# Patient Record
Sex: Male | Born: 1950
Health system: Southern US, Community
[De-identification: ages and names within clinical notes are randomized; demographics above are authoritative.]

## PROBLEM LIST (undated history)

## (undated) DIAGNOSIS — R9431 Abnormal electrocardiogram [ECG] [EKG]: Secondary | ICD-10-CM

## (undated) DIAGNOSIS — J301 Allergic rhinitis due to pollen: Secondary | ICD-10-CM

## (undated) DIAGNOSIS — I10 Essential (primary) hypertension: Secondary | ICD-10-CM

## (undated) DIAGNOSIS — E7800 Pure hypercholesterolemia, unspecified: Secondary | ICD-10-CM

## (undated) HISTORY — DX: Allergic rhinitis due to pollen: J30.1

## (undated) HISTORY — DX: Essential (primary) hypertension: I10

## (undated) HISTORY — DX: Abnormal electrocardiogram (ECG) (EKG): R94.31

## (undated) HISTORY — DX: Pure hypercholesterolemia, unspecified: E78.00

---

## 1998-06-09 ENCOUNTER — Ambulatory Visit (HOSPITAL_COMMUNITY): Admission: RE | Admit: 1998-06-09 | Discharge: 1998-06-09 | Payer: Self-pay | Admitting: Internal Medicine

## 2003-10-23 ENCOUNTER — Encounter: Admission: RE | Admit: 2003-10-23 | Discharge: 2003-10-23 | Payer: Self-pay | Admitting: Internal Medicine

## 2005-02-04 ENCOUNTER — Encounter: Admission: RE | Admit: 2005-02-04 | Discharge: 2005-02-04 | Payer: Self-pay | Admitting: Cardiovascular Disease

## 2005-02-09 ENCOUNTER — Ambulatory Visit (HOSPITAL_COMMUNITY): Admission: RE | Admit: 2005-02-09 | Discharge: 2005-02-09 | Payer: Self-pay | Admitting: Cardiovascular Disease

## 2005-02-18 ENCOUNTER — Ambulatory Visit (HOSPITAL_COMMUNITY): Admission: RE | Admit: 2005-02-18 | Discharge: 2005-02-18 | Payer: Self-pay | Admitting: Cardiovascular Disease

## 2005-08-10 ENCOUNTER — Encounter: Admission: RE | Admit: 2005-08-10 | Discharge: 2005-08-10 | Payer: Self-pay | Admitting: Internal Medicine

## 2005-09-06 ENCOUNTER — Ambulatory Visit: Payer: Self-pay | Admitting: Gastroenterology

## 2005-10-08 ENCOUNTER — Ambulatory Visit: Payer: Self-pay | Admitting: Gastroenterology

## 2006-10-10 ENCOUNTER — Encounter: Admission: RE | Admit: 2006-10-10 | Discharge: 2006-10-10 | Payer: Self-pay | Admitting: Internal Medicine

## 2007-05-02 ENCOUNTER — Emergency Department (HOSPITAL_COMMUNITY): Admission: EM | Admit: 2007-05-02 | Discharge: 2007-05-02 | Payer: Self-pay | Admitting: Emergency Medicine

## 2009-01-14 ENCOUNTER — Encounter: Admission: RE | Admit: 2009-01-14 | Discharge: 2009-01-14 | Payer: Self-pay | Admitting: Internal Medicine

## 2009-03-10 ENCOUNTER — Encounter: Admission: RE | Admit: 2009-03-10 | Discharge: 2009-03-10 | Payer: Self-pay | Admitting: Internal Medicine

## 2010-12-11 ENCOUNTER — Encounter: Payer: Self-pay | Admitting: Internal Medicine

## 2011-01-15 NOTE — Cardiovascular Report (Signed)
Shane Lam, Shane Lam              ACCOUNT NO.:  [id number]   MEDICAL RECORD NO.:  [id number]          PATIENT TYPE:  OIB   LOCATION:  2899                         FACILITY:  [facility]   PHYSICIAN:  Ricki Rodriguez, M.D.  DATE OF BIRTH:  1951/01/10   DATE OF PROCEDURE:  02/18/2005  DATE OF DISCHARGE:                              CARDIAC CATHETERIZATION   PROCEDURE:  Left heart catheterization, selective coronary angiography, left  ventricular function study.   APPROACH:  Right femoral artery using 5 French sheath and catheters.   COMPLICATIONS:  None but 0.5% of Atropine IV was given for heart rate into  the 40's.   INDICATIONS:  This 60 year old black male had atypical chest pain with  abnormal nuclear stress test.   HEMODYNAMIC DATA:  The left ventricular pressure was 125/84 and aortic  pressure was 123/75.   CORONARY ANATOMY:  The left middle cerebral artery was short and  unremarkable.  Left anterior descending coronary artery was essentially  unremarkable.  It had a large diagonal vessel which was also unremarkable.  The left circumflex coronary artery was codominant and had a large obtuse  marginal branch and was essentially unremarkable.  The right coronary  artery:  The right coronary artery was codominant and perhaps had a minimal  luminal irregularity in its proximal portion, otherwise was unremarkable.   IMPRESSION:  1.  Near normal coronaries.  2.  Normal left ventricular systolic function.   RECOMMENDATIONS:  This patient may continue medical therapy for now.       ASK/MEDQ  D:  02/18/2005  T:  02/18/2005  Job:  [id number]

## 2011-06-11 LAB — I-STAT 8, (EC8 V) (CONVERTED LAB)
BUN: 19
Bicarbonate: 28.7 — ABNORMAL HIGH
Glucose, Bld: 93
pCO2, Ven: 47.7
pH, Ven: 7.388 — ABNORMAL HIGH

## 2011-06-11 LAB — POCT CARDIAC MARKERS
CKMB, poc: <1 — ABNORMAL LOW
Myoglobin, poc: 67.3
Myoglobin, poc: 72.3
Troponin i, poc: <0.05

## 2011-06-11 LAB — POCT I-STAT CREATININE
Creatinine, Ser: 1.4
Operator id: 279831

## 2012-10-10 ENCOUNTER — Encounter: Payer: Self-pay | Admitting: Hematology

## 2014-08-19 DIAGNOSIS — I10 Essential (primary) hypertension: Secondary | ICD-10-CM | POA: Diagnosis not present

## 2014-08-19 DIAGNOSIS — Z23 Encounter for immunization: Secondary | ICD-10-CM | POA: Diagnosis not present

## 2014-08-19 DIAGNOSIS — N400 Enlarged prostate without lower urinary tract symptoms: Secondary | ICD-10-CM | POA: Diagnosis not present

## 2014-08-19 DIAGNOSIS — N529 Male erectile dysfunction, unspecified: Secondary | ICD-10-CM | POA: Diagnosis not present

## 2014-08-19 DIAGNOSIS — E785 Hyperlipidemia, unspecified: Secondary | ICD-10-CM | POA: Diagnosis not present

## 2014-08-20 DIAGNOSIS — I10 Essential (primary) hypertension: Secondary | ICD-10-CM | POA: Diagnosis not present

## 2014-08-20 DIAGNOSIS — E780 Pure hypercholesterolemia: Secondary | ICD-10-CM | POA: Diagnosis not present

## 2014-08-20 DIAGNOSIS — E039 Hypothyroidism, unspecified: Secondary | ICD-10-CM | POA: Diagnosis not present

## 2014-08-20 DIAGNOSIS — D649 Anemia, unspecified: Secondary | ICD-10-CM | POA: Diagnosis not present

## 2014-08-20 DIAGNOSIS — E8881 Metabolic syndrome: Secondary | ICD-10-CM | POA: Diagnosis not present

## 2014-08-20 DIAGNOSIS — N400 Enlarged prostate without lower urinary tract symptoms: Secondary | ICD-10-CM | POA: Diagnosis not present

## 2014-09-04 DIAGNOSIS — K5900 Constipation, unspecified: Secondary | ICD-10-CM | POA: Diagnosis not present

## 2014-09-04 DIAGNOSIS — K600 Acute anal fissure: Secondary | ICD-10-CM | POA: Diagnosis not present

## 2014-09-04 DIAGNOSIS — K6289 Other specified diseases of anus and rectum: Secondary | ICD-10-CM | POA: Diagnosis not present

## 2014-10-01 DIAGNOSIS — K602 Anal fissure, unspecified: Secondary | ICD-10-CM | POA: Diagnosis not present

## 2014-10-01 DIAGNOSIS — Z86010 Personal history of colonic polyps: Secondary | ICD-10-CM | POA: Diagnosis not present

## 2014-10-01 DIAGNOSIS — K5900 Constipation, unspecified: Secondary | ICD-10-CM | POA: Diagnosis not present

## 2014-12-26 DIAGNOSIS — Z23 Encounter for immunization: Secondary | ICD-10-CM | POA: Diagnosis not present

## 2015-01-20 DIAGNOSIS — S43431A Superior glenoid labrum lesion of right shoulder, initial encounter: Secondary | ICD-10-CM | POA: Diagnosis not present

## 2015-01-20 DIAGNOSIS — S83242A Other tear of medial meniscus, current injury, left knee, initial encounter: Secondary | ICD-10-CM | POA: Diagnosis not present

## 2015-05-24 DIAGNOSIS — Z008 Encounter for other general examination: Secondary | ICD-10-CM | POA: Diagnosis not present

## 2015-06-13 DIAGNOSIS — Z23 Encounter for immunization: Secondary | ICD-10-CM | POA: Diagnosis not present

## 2015-08-06 DIAGNOSIS — N529 Male erectile dysfunction, unspecified: Secondary | ICD-10-CM | POA: Diagnosis not present

## 2015-08-06 DIAGNOSIS — Z0000 Encounter for general adult medical examination without abnormal findings: Secondary | ICD-10-CM | POA: Diagnosis not present

## 2015-08-06 DIAGNOSIS — Z800 Family history of malignant neoplasm of digestive organs: Secondary | ICD-10-CM | POA: Diagnosis not present

## 2015-08-06 DIAGNOSIS — Z125 Encounter for screening for malignant neoplasm of prostate: Secondary | ICD-10-CM | POA: Diagnosis not present

## 2015-08-06 DIAGNOSIS — Z23 Encounter for immunization: Secondary | ICD-10-CM | POA: Diagnosis not present

## 2015-08-06 DIAGNOSIS — N401 Enlarged prostate with lower urinary tract symptoms: Secondary | ICD-10-CM | POA: Diagnosis not present

## 2015-08-06 DIAGNOSIS — I10 Essential (primary) hypertension: Secondary | ICD-10-CM | POA: Diagnosis not present

## 2015-09-02 DIAGNOSIS — E876 Hypokalemia: Secondary | ICD-10-CM | POA: Diagnosis not present

## 2015-09-04 DIAGNOSIS — I10 Essential (primary) hypertension: Secondary | ICD-10-CM | POA: Diagnosis not present

## 2015-09-12 DIAGNOSIS — I10 Essential (primary) hypertension: Secondary | ICD-10-CM | POA: Diagnosis not present

## 2015-09-18 DIAGNOSIS — I10 Essential (primary) hypertension: Secondary | ICD-10-CM | POA: Diagnosis not present

## 2015-09-30 DIAGNOSIS — I10 Essential (primary) hypertension: Secondary | ICD-10-CM | POA: Diagnosis not present

## 2015-10-02 DIAGNOSIS — I10 Essential (primary) hypertension: Secondary | ICD-10-CM | POA: Diagnosis not present

## 2015-10-06 DIAGNOSIS — I10 Essential (primary) hypertension: Secondary | ICD-10-CM | POA: Diagnosis not present

## 2015-10-14 ENCOUNTER — Encounter: Payer: Self-pay | Admitting: Gastroenterology

## 2015-10-16 DIAGNOSIS — I10 Essential (primary) hypertension: Secondary | ICD-10-CM | POA: Diagnosis not present

## 2015-10-23 DIAGNOSIS — I10 Essential (primary) hypertension: Secondary | ICD-10-CM | POA: Diagnosis not present

## 2015-11-14 DIAGNOSIS — I10 Essential (primary) hypertension: Secondary | ICD-10-CM | POA: Diagnosis not present

## 2015-11-20 DIAGNOSIS — I10 Essential (primary) hypertension: Secondary | ICD-10-CM | POA: Diagnosis not present

## 2016-04-28 DIAGNOSIS — Z23 Encounter for immunization: Secondary | ICD-10-CM | POA: Diagnosis not present

## 2016-06-09 DIAGNOSIS — M79652 Pain in left thigh: Secondary | ICD-10-CM | POA: Diagnosis not present

## 2016-06-09 DIAGNOSIS — M7062 Trochanteric bursitis, left hip: Secondary | ICD-10-CM | POA: Diagnosis not present

## 2016-06-09 DIAGNOSIS — M25552 Pain in left hip: Secondary | ICD-10-CM | POA: Diagnosis not present

## 2016-08-09 DIAGNOSIS — I10 Essential (primary) hypertension: Secondary | ICD-10-CM | POA: Diagnosis not present

## 2016-08-09 DIAGNOSIS — Z0000 Encounter for general adult medical examination without abnormal findings: Secondary | ICD-10-CM | POA: Diagnosis not present

## 2016-08-09 DIAGNOSIS — Z125 Encounter for screening for malignant neoplasm of prostate: Secondary | ICD-10-CM | POA: Diagnosis not present

## 2016-08-13 DIAGNOSIS — R7989 Other specified abnormal findings of blood chemistry: Secondary | ICD-10-CM | POA: Diagnosis not present

## 2016-08-13 DIAGNOSIS — N401 Enlarged prostate with lower urinary tract symptoms: Secondary | ICD-10-CM | POA: Diagnosis not present

## 2016-08-13 DIAGNOSIS — N529 Male erectile dysfunction, unspecified: Secondary | ICD-10-CM | POA: Diagnosis not present

## 2016-08-13 DIAGNOSIS — I10 Essential (primary) hypertension: Secondary | ICD-10-CM | POA: Diagnosis not present

## 2016-08-13 DIAGNOSIS — Z800 Family history of malignant neoplasm of digestive organs: Secondary | ICD-10-CM | POA: Diagnosis not present

## 2016-08-25 DIAGNOSIS — Z1212 Encounter for screening for malignant neoplasm of rectum: Secondary | ICD-10-CM | POA: Diagnosis not present

## 2016-08-25 DIAGNOSIS — Z1211 Encounter for screening for malignant neoplasm of colon: Secondary | ICD-10-CM | POA: Diagnosis not present

## 2017-08-16 DIAGNOSIS — Z23 Encounter for immunization: Secondary | ICD-10-CM | POA: Diagnosis not present

## 2017-08-16 DIAGNOSIS — I10 Essential (primary) hypertension: Secondary | ICD-10-CM | POA: Diagnosis not present

## 2017-08-16 DIAGNOSIS — E876 Hypokalemia: Secondary | ICD-10-CM | POA: Diagnosis not present

## 2017-08-16 DIAGNOSIS — Z125 Encounter for screening for malignant neoplasm of prostate: Secondary | ICD-10-CM | POA: Diagnosis not present

## 2017-08-19 DIAGNOSIS — N529 Male erectile dysfunction, unspecified: Secondary | ICD-10-CM | POA: Diagnosis not present

## 2017-08-19 DIAGNOSIS — E876 Hypokalemia: Secondary | ICD-10-CM | POA: Diagnosis not present

## 2017-08-19 DIAGNOSIS — N281 Cyst of kidney, acquired: Secondary | ICD-10-CM | POA: Diagnosis not present

## 2017-08-19 DIAGNOSIS — Z800 Family history of malignant neoplasm of digestive organs: Secondary | ICD-10-CM | POA: Diagnosis not present

## 2017-08-19 DIAGNOSIS — I10 Essential (primary) hypertension: Secondary | ICD-10-CM | POA: Diagnosis not present

## 2017-08-19 DIAGNOSIS — N401 Enlarged prostate with lower urinary tract symptoms: Secondary | ICD-10-CM | POA: Diagnosis not present

## 2017-09-16 DIAGNOSIS — R790 Abnormal level of blood mineral: Secondary | ICD-10-CM | POA: Diagnosis not present

## 2017-09-16 DIAGNOSIS — I10 Essential (primary) hypertension: Secondary | ICD-10-CM | POA: Diagnosis not present

## 2017-09-19 DIAGNOSIS — E876 Hypokalemia: Secondary | ICD-10-CM | POA: Diagnosis not present

## 2017-09-19 DIAGNOSIS — I10 Essential (primary) hypertension: Secondary | ICD-10-CM | POA: Diagnosis not present

## 2017-09-19 DIAGNOSIS — Z6830 Body mass index (BMI) 30.0-30.9, adult: Secondary | ICD-10-CM | POA: Diagnosis not present

## 2017-09-22 DIAGNOSIS — E876 Hypokalemia: Secondary | ICD-10-CM | POA: Diagnosis not present

## 2017-11-17 DIAGNOSIS — M19071 Primary osteoarthritis, right ankle and foot: Secondary | ICD-10-CM | POA: Diagnosis not present

## 2017-11-17 DIAGNOSIS — I10 Essential (primary) hypertension: Secondary | ICD-10-CM | POA: Diagnosis not present

## 2017-11-17 DIAGNOSIS — M25571 Pain in right ankle and joints of right foot: Secondary | ICD-10-CM | POA: Diagnosis not present

## 2017-11-21 DIAGNOSIS — M25571 Pain in right ankle and joints of right foot: Secondary | ICD-10-CM | POA: Diagnosis not present

## 2017-12-19 DIAGNOSIS — I10 Essential (primary) hypertension: Secondary | ICD-10-CM | POA: Diagnosis not present

## 2017-12-19 DIAGNOSIS — Z6827 Body mass index (BMI) 27.0-27.9, adult: Secondary | ICD-10-CM | POA: Diagnosis not present

## 2018-01-04 DIAGNOSIS — E876 Hypokalemia: Secondary | ICD-10-CM | POA: Diagnosis not present

## 2018-01-13 DIAGNOSIS — E876 Hypokalemia: Secondary | ICD-10-CM | POA: Diagnosis not present

## 2018-03-29 DIAGNOSIS — M7711 Lateral epicondylitis, right elbow: Secondary | ICD-10-CM | POA: Diagnosis not present

## 2018-03-30 DIAGNOSIS — M7712 Lateral epicondylitis, left elbow: Secondary | ICD-10-CM | POA: Diagnosis not present

## 2018-05-15 DIAGNOSIS — Z23 Encounter for immunization: Secondary | ICD-10-CM | POA: Diagnosis not present

## 2018-06-29 DIAGNOSIS — Z1211 Encounter for screening for malignant neoplasm of colon: Secondary | ICD-10-CM | POA: Diagnosis not present

## 2018-06-29 DIAGNOSIS — Z86010 Personal history of colonic polyps: Secondary | ICD-10-CM | POA: Diagnosis not present

## 2018-06-29 DIAGNOSIS — K641 Second degree hemorrhoids: Secondary | ICD-10-CM | POA: Diagnosis not present

## 2018-07-17 DIAGNOSIS — D125 Benign neoplasm of sigmoid colon: Secondary | ICD-10-CM | POA: Diagnosis not present

## 2018-07-17 DIAGNOSIS — K635 Polyp of colon: Secondary | ICD-10-CM | POA: Diagnosis not present

## 2018-07-17 DIAGNOSIS — Z1211 Encounter for screening for malignant neoplasm of colon: Secondary | ICD-10-CM | POA: Diagnosis not present

## 2018-07-17 DIAGNOSIS — Z86010 Personal history of colonic polyps: Secondary | ICD-10-CM | POA: Diagnosis not present

## 2018-08-15 DIAGNOSIS — M7712 Lateral epicondylitis, left elbow: Secondary | ICD-10-CM | POA: Diagnosis not present

## 2018-08-21 ENCOUNTER — Other Ambulatory Visit: Payer: Self-pay | Admitting: Orthopedic Surgery

## 2018-08-21 DIAGNOSIS — M25522 Pain in left elbow: Secondary | ICD-10-CM

## 2018-08-22 ENCOUNTER — Other Ambulatory Visit: Payer: Self-pay | Admitting: Orthopedic Surgery

## 2018-08-25 DIAGNOSIS — E559 Vitamin D deficiency, unspecified: Secondary | ICD-10-CM | POA: Diagnosis not present

## 2018-08-25 DIAGNOSIS — Z125 Encounter for screening for malignant neoplasm of prostate: Secondary | ICD-10-CM | POA: Diagnosis not present

## 2018-08-25 DIAGNOSIS — I10 Essential (primary) hypertension: Secondary | ICD-10-CM | POA: Diagnosis not present

## 2018-08-25 DIAGNOSIS — Z0000 Encounter for general adult medical examination without abnormal findings: Secondary | ICD-10-CM | POA: Diagnosis not present

## 2018-08-28 DIAGNOSIS — R748 Abnormal levels of other serum enzymes: Secondary | ICD-10-CM | POA: Diagnosis not present

## 2018-08-28 DIAGNOSIS — M25522 Pain in left elbow: Secondary | ICD-10-CM | POA: Diagnosis not present

## 2018-08-28 DIAGNOSIS — I10 Essential (primary) hypertension: Secondary | ICD-10-CM | POA: Diagnosis not present

## 2018-08-28 DIAGNOSIS — Z86010 Personal history of colonic polyps: Secondary | ICD-10-CM | POA: Diagnosis not present

## 2018-08-28 DIAGNOSIS — N529 Male erectile dysfunction, unspecified: Secondary | ICD-10-CM | POA: Diagnosis not present

## 2018-08-28 DIAGNOSIS — N401 Enlarged prostate with lower urinary tract symptoms: Secondary | ICD-10-CM | POA: Diagnosis not present

## 2018-08-28 DIAGNOSIS — N281 Cyst of kidney, acquired: Secondary | ICD-10-CM | POA: Diagnosis not present

## 2018-08-28 DIAGNOSIS — Z0000 Encounter for general adult medical examination without abnormal findings: Secondary | ICD-10-CM | POA: Diagnosis not present

## 2018-08-28 DIAGNOSIS — Z800 Family history of malignant neoplasm of digestive organs: Secondary | ICD-10-CM | POA: Diagnosis not present

## 2018-08-28 DIAGNOSIS — E559 Vitamin D deficiency, unspecified: Secondary | ICD-10-CM | POA: Diagnosis not present

## 2018-08-29 ENCOUNTER — Ambulatory Visit
Admission: RE | Admit: 2018-08-29 | Discharge: 2018-08-29 | Disposition: A | Discharge status: Home / Self Care | Payer: Medicare Other | Source: Ambulatory Visit | Attending: Orthopedic Surgery | Admitting: Orthopedic Surgery

## 2018-08-29 DIAGNOSIS — M25522 Pain in left elbow: Secondary | ICD-10-CM

## 2018-08-29 DIAGNOSIS — M19022 Primary osteoarthritis, left elbow: Secondary | ICD-10-CM | POA: Diagnosis not present

## 2018-08-31 ENCOUNTER — Other Ambulatory Visit: Payer: Self-pay

## 2018-09-07 DIAGNOSIS — M7712 Lateral epicondylitis, left elbow: Secondary | ICD-10-CM | POA: Diagnosis not present

## 2018-09-20 ENCOUNTER — Other Ambulatory Visit: Payer: Self-pay | Admitting: Internal Medicine

## 2018-09-20 DIAGNOSIS — R5381 Other malaise: Secondary | ICD-10-CM

## 2018-09-22 ENCOUNTER — Other Ambulatory Visit: Payer: Self-pay | Admitting: Internal Medicine

## 2018-09-22 DIAGNOSIS — R748 Abnormal levels of other serum enzymes: Secondary | ICD-10-CM

## 2018-09-25 ENCOUNTER — Ambulatory Visit
Admission: RE | Admit: 2018-09-25 | Discharge: 2018-09-25 | Disposition: A | Discharge status: Home / Self Care | Payer: Medicare Other | Source: Ambulatory Visit | Attending: Internal Medicine | Admitting: Internal Medicine

## 2018-09-25 DIAGNOSIS — R748 Abnormal levels of other serum enzymes: Secondary | ICD-10-CM

## 2018-09-25 DIAGNOSIS — N281 Cyst of kidney, acquired: Secondary | ICD-10-CM | POA: Diagnosis not present

## 2018-10-05 DIAGNOSIS — M7582 Other shoulder lesions, left shoulder: Secondary | ICD-10-CM | POA: Diagnosis not present

## 2018-10-06 DIAGNOSIS — M7582 Other shoulder lesions, left shoulder: Secondary | ICD-10-CM | POA: Diagnosis not present

## 2018-10-06 DIAGNOSIS — M7712 Lateral epicondylitis, left elbow: Secondary | ICD-10-CM | POA: Diagnosis not present

## 2018-10-12 DIAGNOSIS — M25532 Pain in left wrist: Secondary | ICD-10-CM | POA: Diagnosis not present

## 2018-10-19 DIAGNOSIS — M25532 Pain in left wrist: Secondary | ICD-10-CM | POA: Diagnosis not present

## 2018-10-30 DIAGNOSIS — M25622 Stiffness of left elbow, not elsewhere classified: Secondary | ICD-10-CM | POA: Diagnosis not present

## 2018-10-30 DIAGNOSIS — M7712 Lateral epicondylitis, left elbow: Secondary | ICD-10-CM | POA: Diagnosis not present

## 2018-10-30 DIAGNOSIS — M6281 Muscle weakness (generalized): Secondary | ICD-10-CM | POA: Diagnosis not present

## 2018-10-30 DIAGNOSIS — M25522 Pain in left elbow: Secondary | ICD-10-CM | POA: Diagnosis not present

## 2018-11-14 DIAGNOSIS — M25522 Pain in left elbow: Secondary | ICD-10-CM | POA: Diagnosis not present

## 2018-11-15 DIAGNOSIS — M6281 Muscle weakness (generalized): Secondary | ICD-10-CM | POA: Diagnosis not present

## 2018-11-15 DIAGNOSIS — M25622 Stiffness of left elbow, not elsewhere classified: Secondary | ICD-10-CM | POA: Diagnosis not present

## 2018-11-15 DIAGNOSIS — M25522 Pain in left elbow: Secondary | ICD-10-CM | POA: Diagnosis not present

## 2018-11-15 DIAGNOSIS — M7712 Lateral epicondylitis, left elbow: Secondary | ICD-10-CM | POA: Diagnosis not present

## 2018-12-05 DIAGNOSIS — M25522 Pain in left elbow: Secondary | ICD-10-CM | POA: Diagnosis not present

## 2019-01-02 DIAGNOSIS — M25522 Pain in left elbow: Secondary | ICD-10-CM | POA: Diagnosis not present

## 2019-02-20 DIAGNOSIS — M7712 Lateral epicondylitis, left elbow: Secondary | ICD-10-CM | POA: Diagnosis not present

## 2019-05-02 DIAGNOSIS — Z23 Encounter for immunization: Secondary | ICD-10-CM | POA: Diagnosis not present

## 2019-06-05 DIAGNOSIS — J029 Acute pharyngitis, unspecified: Secondary | ICD-10-CM | POA: Diagnosis not present

## 2019-06-19 ENCOUNTER — Other Ambulatory Visit: Payer: Self-pay

## 2019-06-19 DIAGNOSIS — Z20822 Contact with and (suspected) exposure to covid-19: Secondary | ICD-10-CM

## 2019-06-21 LAB — NOVEL CORONAVIRUS, NAA: SARS-CoV-2, NAA: NOT DETECTED

## 2019-07-25 ENCOUNTER — Other Ambulatory Visit: Payer: Self-pay

## 2019-09-03 DIAGNOSIS — R748 Abnormal levels of other serum enzymes: Secondary | ICD-10-CM | POA: Diagnosis not present

## 2019-09-03 DIAGNOSIS — Z79899 Other long term (current) drug therapy: Secondary | ICD-10-CM | POA: Diagnosis not present

## 2019-09-03 DIAGNOSIS — Z1159 Encounter for screening for other viral diseases: Secondary | ICD-10-CM | POA: Diagnosis not present

## 2019-09-03 DIAGNOSIS — I10 Essential (primary) hypertension: Secondary | ICD-10-CM | POA: Diagnosis not present

## 2019-09-03 DIAGNOSIS — Z125 Encounter for screening for malignant neoplasm of prostate: Secondary | ICD-10-CM | POA: Diagnosis not present

## 2019-09-03 DIAGNOSIS — Z0000 Encounter for general adult medical examination without abnormal findings: Secondary | ICD-10-CM | POA: Diagnosis not present

## 2019-09-06 DIAGNOSIS — N529 Male erectile dysfunction, unspecified: Secondary | ICD-10-CM | POA: Diagnosis not present

## 2019-09-06 DIAGNOSIS — Z800 Family history of malignant neoplasm of digestive organs: Secondary | ICD-10-CM | POA: Diagnosis not present

## 2019-09-06 DIAGNOSIS — E876 Hypokalemia: Secondary | ICD-10-CM | POA: Diagnosis not present

## 2019-09-06 DIAGNOSIS — Z0000 Encounter for general adult medical examination without abnormal findings: Secondary | ICD-10-CM | POA: Diagnosis not present

## 2019-09-06 DIAGNOSIS — Z1212 Encounter for screening for malignant neoplasm of rectum: Secondary | ICD-10-CM | POA: Diagnosis not present

## 2019-09-06 DIAGNOSIS — R748 Abnormal levels of other serum enzymes: Secondary | ICD-10-CM | POA: Diagnosis not present

## 2019-09-06 DIAGNOSIS — N401 Enlarged prostate with lower urinary tract symptoms: Secondary | ICD-10-CM | POA: Diagnosis not present

## 2019-09-06 DIAGNOSIS — I10 Essential (primary) hypertension: Secondary | ICD-10-CM | POA: Diagnosis not present

## 2019-09-06 DIAGNOSIS — Z86010 Personal history of colonic polyps: Secondary | ICD-10-CM | POA: Diagnosis not present

## 2019-09-20 DIAGNOSIS — R5383 Other fatigue: Secondary | ICD-10-CM | POA: Diagnosis not present

## 2019-09-20 DIAGNOSIS — R748 Abnormal levels of other serum enzymes: Secondary | ICD-10-CM | POA: Diagnosis not present

## 2019-09-20 DIAGNOSIS — Z6828 Body mass index (BMI) 28.0-28.9, adult: Secondary | ICD-10-CM | POA: Diagnosis not present

## 2019-09-20 DIAGNOSIS — E875 Hyperkalemia: Secondary | ICD-10-CM | POA: Diagnosis not present

## 2019-09-20 DIAGNOSIS — I10 Essential (primary) hypertension: Secondary | ICD-10-CM | POA: Diagnosis not present

## 2019-09-20 DIAGNOSIS — N281 Cyst of kidney, acquired: Secondary | ICD-10-CM | POA: Diagnosis not present

## 2019-09-20 DIAGNOSIS — E876 Hypokalemia: Secondary | ICD-10-CM | POA: Diagnosis not present

## 2019-09-27 DIAGNOSIS — E876 Hypokalemia: Secondary | ICD-10-CM | POA: Diagnosis not present

## 2019-10-21 ENCOUNTER — Ambulatory Visit: Payer: Medicare Other | Attending: Internal Medicine

## 2019-10-21 DIAGNOSIS — Z23 Encounter for immunization: Secondary | ICD-10-CM | POA: Insufficient documentation

## 2019-10-21 NOTE — Progress Notes (Signed)
   Covid-19 Vaccination Clinic  Name:  Shane Lam    MRN: [id number] DOB: 10/26/1950  10/21/2019  [name] was observed post Covid-19 immunization for 15 minutes without incidence. He was provided with Vaccine Information Sheet and instruction to access the V-Safe system.   [name] was instructed to call 911 with any severe reactions post vaccine: Marland Kitchen Difficulty breathing  . Swelling of your face and throat  . A fast heartbeat  . A bad rash all over your body  . Dizziness and weakness    Immunizations Administered    Name Date Dose VIS Date Route   Pfizer COVID-19 Vaccine 10/21/2019  1:29 PM 0.3 mL 08/10/2019 Intramuscular   Manufacturer: [place]   Lot: [id number]   [place]: [id number]

## 2019-11-01 DIAGNOSIS — R5383 Other fatigue: Secondary | ICD-10-CM | POA: Diagnosis not present

## 2019-11-01 DIAGNOSIS — E876 Hypokalemia: Secondary | ICD-10-CM | POA: Diagnosis not present

## 2019-11-01 DIAGNOSIS — N281 Cyst of kidney, acquired: Secondary | ICD-10-CM | POA: Diagnosis not present

## 2019-11-01 DIAGNOSIS — Z6828 Body mass index (BMI) 28.0-28.9, adult: Secondary | ICD-10-CM | POA: Diagnosis not present

## 2019-11-01 DIAGNOSIS — I10 Essential (primary) hypertension: Secondary | ICD-10-CM | POA: Diagnosis not present

## 2019-11-05 DIAGNOSIS — E876 Hypokalemia: Secondary | ICD-10-CM | POA: Diagnosis not present

## 2019-11-14 ENCOUNTER — Ambulatory Visit: Payer: Medicare Other | Attending: Internal Medicine

## 2019-11-14 DIAGNOSIS — Z23 Encounter for immunization: Secondary | ICD-10-CM

## 2019-11-14 NOTE — Progress Notes (Signed)
   Covid-19 Vaccination Clinic  Name:  Shane Lam    MRN: [id number] DOB: [birth year]  11/14/2019  Mr. Conger was observed post Covid-19 immunization for 15 minutes without incident. He was provided with Vaccine Information Sheet and instruction to access the V-Safe system.   Mr. Ledezma was instructed to call 911 with any severe reactions post vaccine: Marland Kitchen Difficulty breathing  . Swelling of face and throat  . A fast heartbeat  . A bad rash all over body  . Dizziness and weakness   Immunizations Administered    Name Date Dose VIS Date Route   Pfizer COVID-19 Vaccine 11/14/2019  9:21 AM 0.3 mL 08/10/2019 Intramuscular   Manufacturer: [place]   Lot: [id number]   [place]: [id number]

## 2019-11-29 DIAGNOSIS — E876 Hypokalemia: Secondary | ICD-10-CM | POA: Diagnosis not present

## 2019-11-29 DIAGNOSIS — I10 Essential (primary) hypertension: Secondary | ICD-10-CM | POA: Diagnosis not present

## 2019-11-29 DIAGNOSIS — Z6828 Body mass index (BMI) 28.0-28.9, adult: Secondary | ICD-10-CM | POA: Diagnosis not present

## 2019-11-29 DIAGNOSIS — N281 Cyst of kidney, acquired: Secondary | ICD-10-CM | POA: Diagnosis not present

## 2019-12-24 DIAGNOSIS — E876 Hypokalemia: Secondary | ICD-10-CM | POA: Diagnosis not present

## 2020-01-07 DIAGNOSIS — E876 Hypokalemia: Secondary | ICD-10-CM | POA: Diagnosis not present

## 2020-01-10 DIAGNOSIS — I10 Essential (primary) hypertension: Secondary | ICD-10-CM | POA: Diagnosis not present

## 2020-01-10 DIAGNOSIS — Z6828 Body mass index (BMI) 28.0-28.9, adult: Secondary | ICD-10-CM | POA: Diagnosis not present

## 2020-01-10 DIAGNOSIS — N281 Cyst of kidney, acquired: Secondary | ICD-10-CM | POA: Diagnosis not present

## 2020-01-10 DIAGNOSIS — E876 Hypokalemia: Secondary | ICD-10-CM | POA: Diagnosis not present

## 2020-02-04 DIAGNOSIS — E876 Hypokalemia: Secondary | ICD-10-CM | POA: Diagnosis not present

## 2020-02-07 DIAGNOSIS — I10 Essential (primary) hypertension: Secondary | ICD-10-CM | POA: Diagnosis not present

## 2020-02-07 DIAGNOSIS — E876 Hypokalemia: Secondary | ICD-10-CM | POA: Diagnosis not present

## 2020-02-07 DIAGNOSIS — N281 Cyst of kidney, acquired: Secondary | ICD-10-CM | POA: Diagnosis not present

## 2020-02-07 DIAGNOSIS — Z6828 Body mass index (BMI) 28.0-28.9, adult: Secondary | ICD-10-CM | POA: Diagnosis not present

## 2020-02-07 DIAGNOSIS — E269 Hyperaldosteronism, unspecified: Secondary | ICD-10-CM | POA: Diagnosis not present

## 2020-05-01 DIAGNOSIS — E269 Hyperaldosteronism, unspecified: Secondary | ICD-10-CM | POA: Diagnosis not present

## 2020-05-08 DIAGNOSIS — E876 Hypokalemia: Secondary | ICD-10-CM | POA: Diagnosis not present

## 2020-05-08 DIAGNOSIS — N281 Cyst of kidney, acquired: Secondary | ICD-10-CM | POA: Diagnosis not present

## 2020-05-08 DIAGNOSIS — E269 Hyperaldosteronism, unspecified: Secondary | ICD-10-CM | POA: Diagnosis not present

## 2020-05-08 DIAGNOSIS — Z6828 Body mass index (BMI) 28.0-28.9, adult: Secondary | ICD-10-CM | POA: Diagnosis not present

## 2020-05-08 DIAGNOSIS — I10 Essential (primary) hypertension: Secondary | ICD-10-CM | POA: Diagnosis not present

## 2020-05-21 DIAGNOSIS — E876 Hypokalemia: Secondary | ICD-10-CM | POA: Diagnosis not present

## 2020-05-21 DIAGNOSIS — I10 Essential (primary) hypertension: Secondary | ICD-10-CM | POA: Diagnosis not present

## 2020-06-13 DIAGNOSIS — Z23 Encounter for immunization: Secondary | ICD-10-CM | POA: Diagnosis not present

## 2020-07-09 IMAGING — US US ABDOMEN COMPLETE
1 series · 13 of 25 positions shown · non-contrast
Comparison: None.

CLINICAL DATA: Elevated liver enzymes.

EXAM:
ABDOMEN ULTRASOUND COMPLETE

[Series 1: us abdomen complete · 0.19mm/px · 13 of 113 slices shown]
[im 1/113]
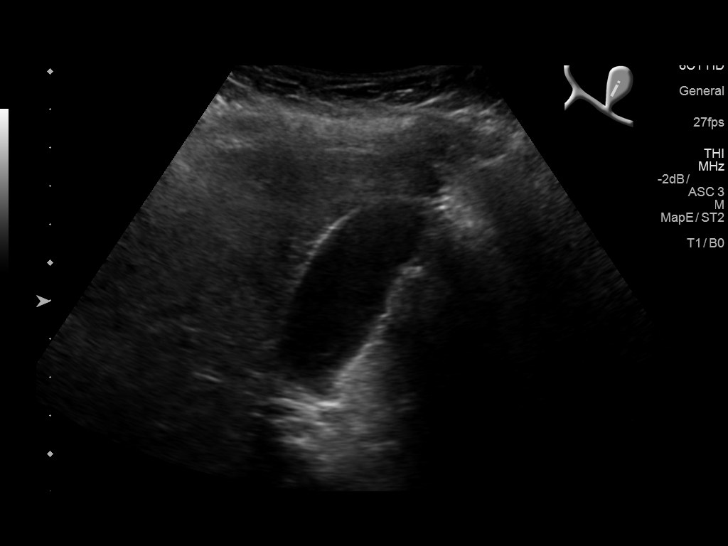
[im 10/113]
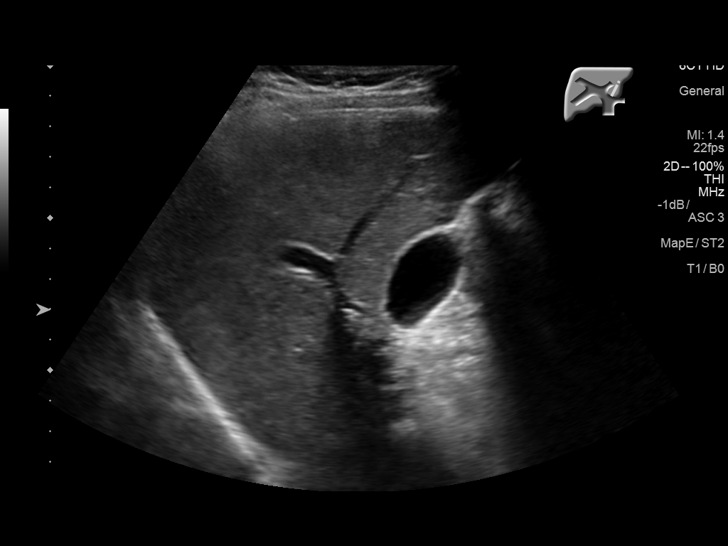
[im 19/113]
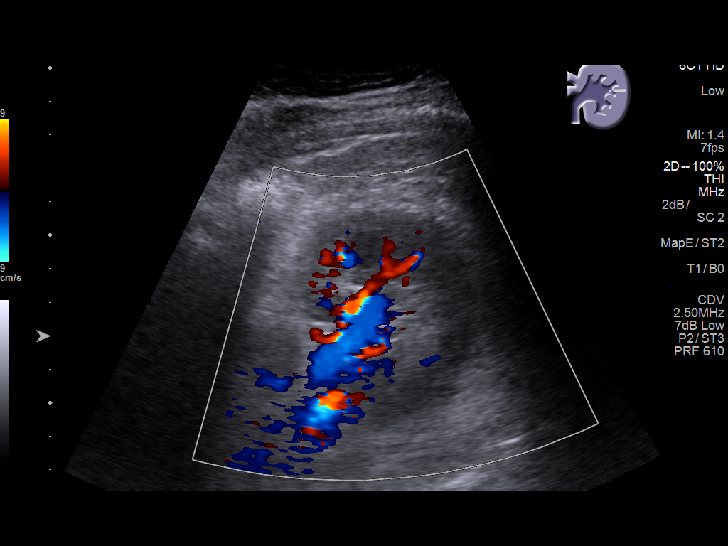
[im 29/113]
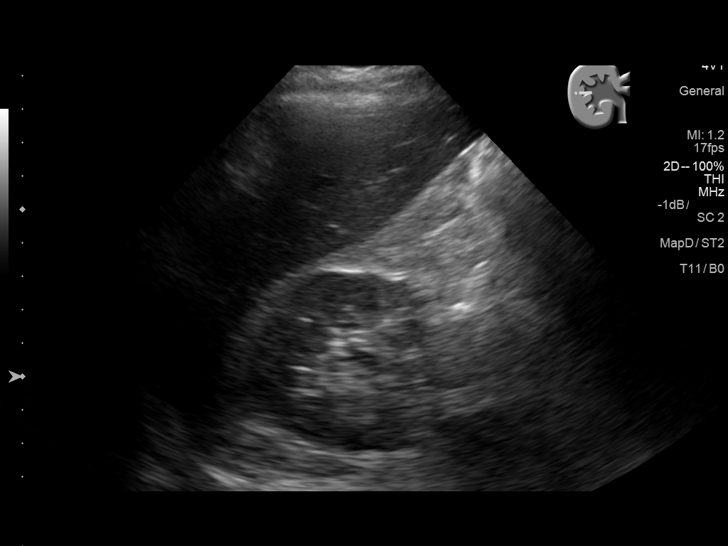
[im 38/113]
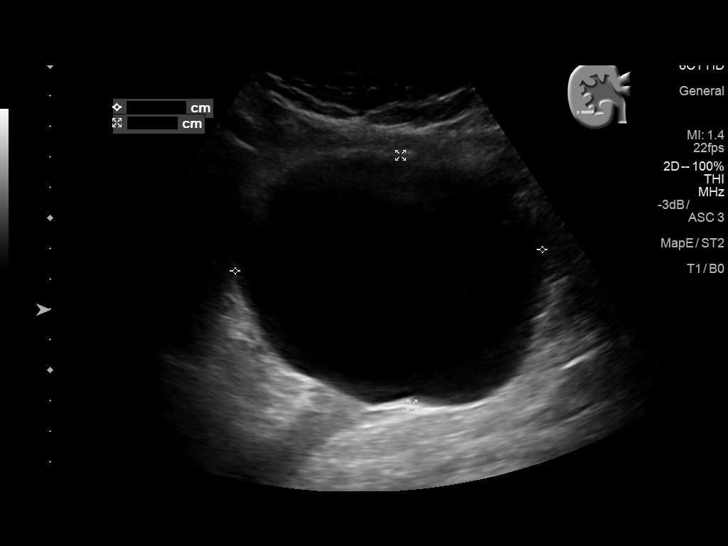
[im 47/113]
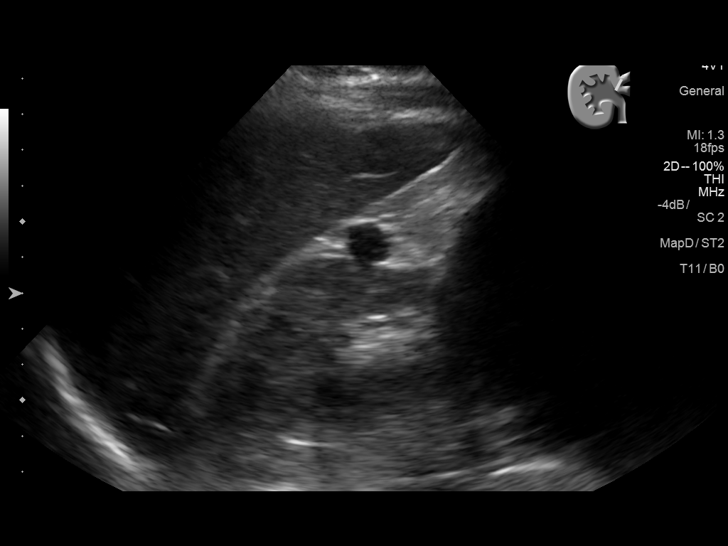
[im 57/113]
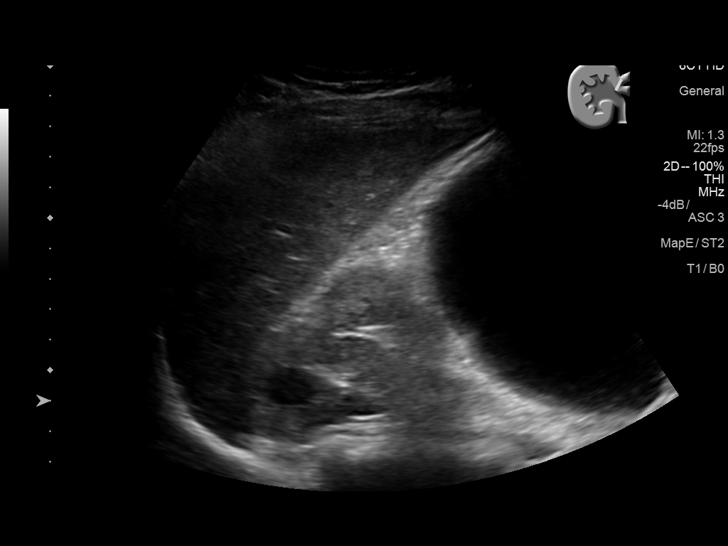
[im 66/113]
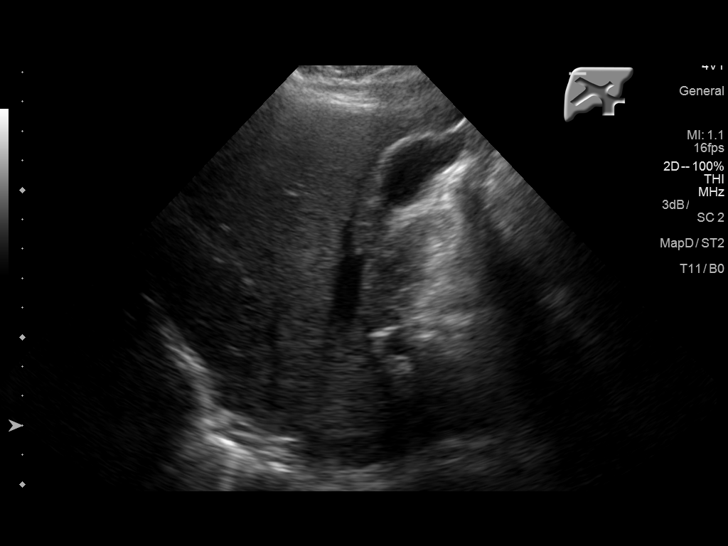
[im 75/113]
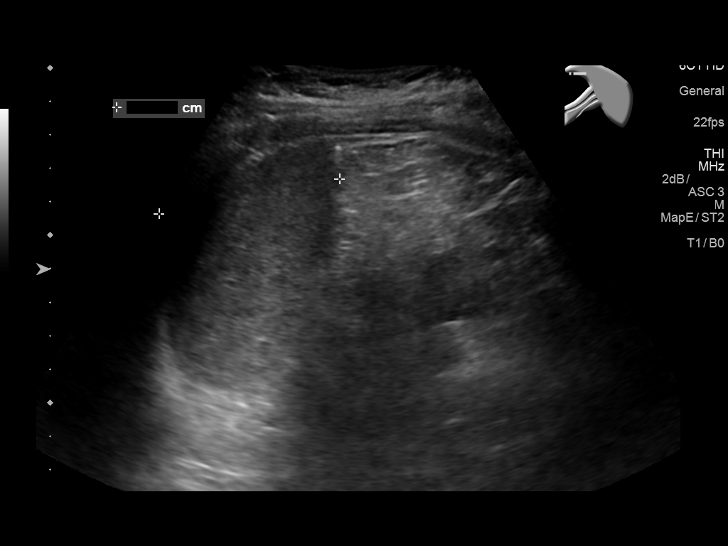
[im 85/113]
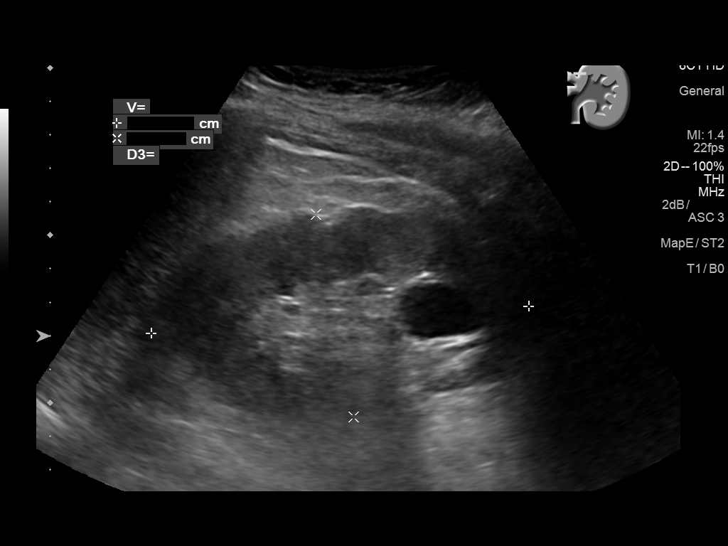
[im 94/113]
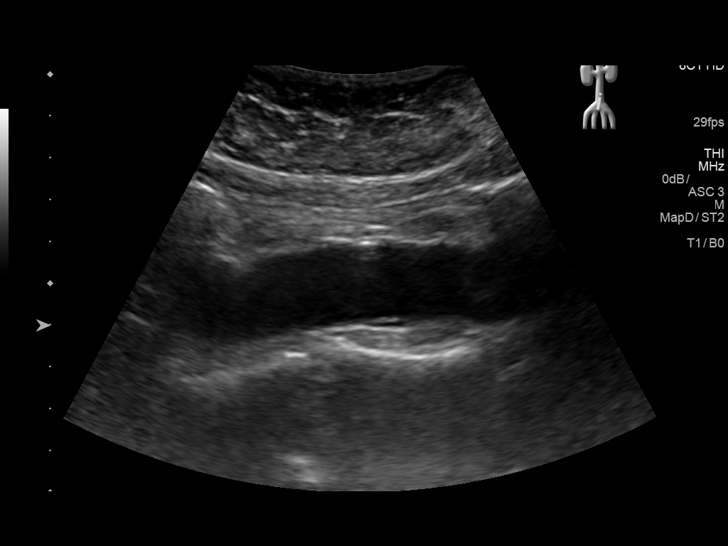
[im 103/113]
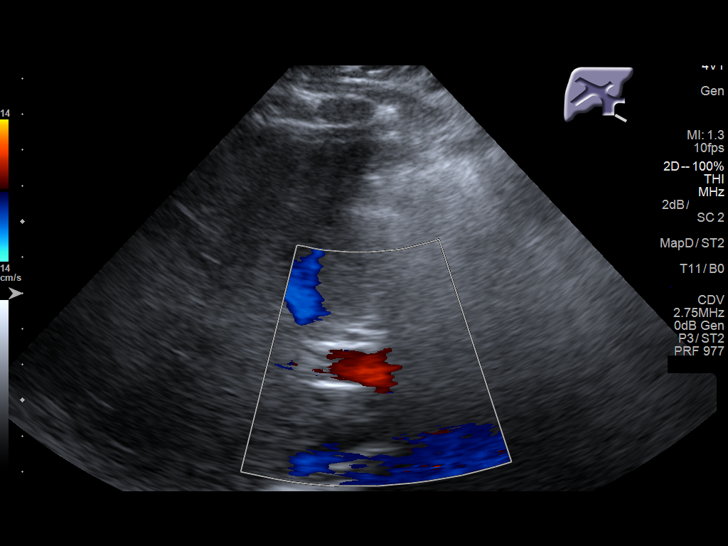
[im 113/113]
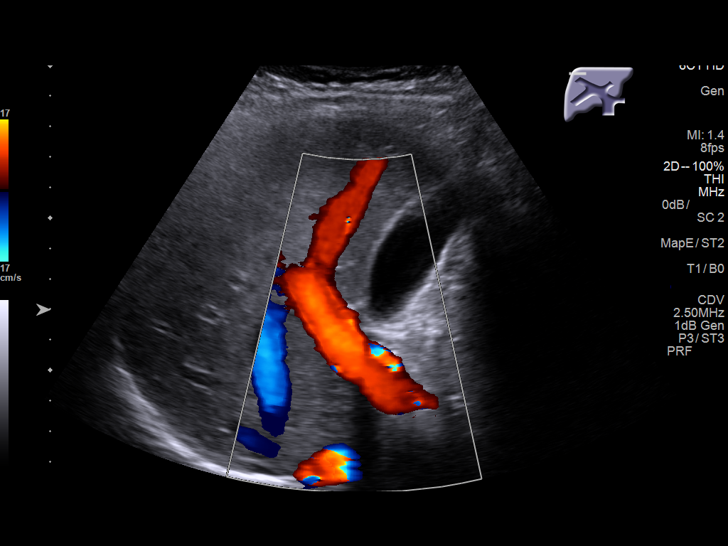

[13 of 25 positions shown; findings below may reference images not displayed]

FINDINGS: Gallbladder: No gallstones or wall thickening visualized. No
sonographic Murphy sign noted by sonographer.

Common bile duct: Diameter: 3 mm, within normal limits.

Liver: No focal lesion identified. Within normal limits in
parenchymal echogenicity. Portal vein is patent on color Doppler
imaging with normal direction of blood flow towards the liver.

IVC: No abnormality visualized.

Pancreas: Not well visualized due to overlying bowel gas.

Spleen: Size and appearance within normal limits.

Right Kidney: Length: 16.0 cm. Mildly increased renal parenchymal
echogenicity, consistent with medical renal disease. Several
benign-appearing cysts are seen, largest in the lower pole measuring
10 cm. No mass or hydronephrosis visualized.

Left Kidney: Length: 11.3 cm. Mildly increased renal parenchymal
echogenicity, consistent with medical renal disease. Small
benign-appearing renal sinus cyst seen in lower pole measuring 2 cm.
No mass or hydronephrosis visualized.

Abdominal aorta: No aneurysm visualized.

Other findings: None.
IMPRESSION: No hepatobiliary abnormality identified.

Mildly increased renal parenchymal echogenicity, suspicious for
medical renal disease. No evidence of hydronephrosis.

Bilateral renal cysts, largest in right kidney measuring 10 cm.

## 2020-10-16 DIAGNOSIS — I10 Essential (primary) hypertension: Secondary | ICD-10-CM | POA: Diagnosis not present

## 2020-10-16 DIAGNOSIS — Z125 Encounter for screening for malignant neoplasm of prostate: Secondary | ICD-10-CM | POA: Diagnosis not present

## 2020-10-23 DIAGNOSIS — E876 Hypokalemia: Secondary | ICD-10-CM | POA: Diagnosis not present

## 2020-10-23 DIAGNOSIS — R748 Abnormal levels of other serum enzymes: Secondary | ICD-10-CM | POA: Diagnosis not present

## 2020-10-23 DIAGNOSIS — Z0000 Encounter for general adult medical examination without abnormal findings: Secondary | ICD-10-CM | POA: Diagnosis not present

## 2020-10-23 DIAGNOSIS — E7800 Pure hypercholesterolemia, unspecified: Secondary | ICD-10-CM | POA: Diagnosis not present

## 2020-10-23 DIAGNOSIS — E269 Hyperaldosteronism, unspecified: Secondary | ICD-10-CM | POA: Diagnosis not present

## 2020-10-23 DIAGNOSIS — N401 Enlarged prostate with lower urinary tract symptoms: Secondary | ICD-10-CM | POA: Diagnosis not present

## 2020-10-23 DIAGNOSIS — N529 Male erectile dysfunction, unspecified: Secondary | ICD-10-CM | POA: Diagnosis not present

## 2020-10-23 DIAGNOSIS — I10 Essential (primary) hypertension: Secondary | ICD-10-CM | POA: Diagnosis not present

## 2020-10-24 ENCOUNTER — Other Ambulatory Visit: Payer: Self-pay | Admitting: Internal Medicine

## 2020-10-24 DIAGNOSIS — I10 Essential (primary) hypertension: Secondary | ICD-10-CM

## 2020-10-30 DIAGNOSIS — R760 Raised antibody titer: Secondary | ICD-10-CM | POA: Diagnosis not present

## 2020-10-30 DIAGNOSIS — N289 Disorder of kidney and ureter, unspecified: Secondary | ICD-10-CM | POA: Diagnosis not present

## 2020-10-30 DIAGNOSIS — R748 Abnormal levels of other serum enzymes: Secondary | ICD-10-CM | POA: Diagnosis not present

## 2020-11-12 ENCOUNTER — Ambulatory Visit
Admission: RE | Admit: 2020-11-12 | Discharge: 2020-11-12 | Disposition: A | Discharge status: Home / Self Care | Payer: No Typology Code available for payment source | Source: Ambulatory Visit | Attending: Internal Medicine | Admitting: Internal Medicine

## 2020-11-12 ENCOUNTER — Other Ambulatory Visit: Payer: Self-pay

## 2020-11-12 DIAGNOSIS — I10 Essential (primary) hypertension: Secondary | ICD-10-CM

## 2020-11-12 DIAGNOSIS — R599 Enlarged lymph nodes, unspecified: Secondary | ICD-10-CM | POA: Diagnosis not present

## 2020-12-22 DIAGNOSIS — B029 Zoster without complications: Secondary | ICD-10-CM | POA: Diagnosis not present

## 2020-12-26 DIAGNOSIS — E876 Hypokalemia: Secondary | ICD-10-CM | POA: Diagnosis not present

## 2021-01-06 DIAGNOSIS — E876 Hypokalemia: Secondary | ICD-10-CM | POA: Diagnosis not present

## 2021-01-06 DIAGNOSIS — Z6826 Body mass index (BMI) 26.0-26.9, adult: Secondary | ICD-10-CM | POA: Diagnosis not present

## 2021-01-06 DIAGNOSIS — E269 Hyperaldosteronism, unspecified: Secondary | ICD-10-CM | POA: Diagnosis not present

## 2021-01-06 DIAGNOSIS — I10 Essential (primary) hypertension: Secondary | ICD-10-CM | POA: Diagnosis not present

## 2021-01-06 DIAGNOSIS — N281 Cyst of kidney, acquired: Secondary | ICD-10-CM | POA: Diagnosis not present

## 2021-02-04 DIAGNOSIS — Z23 Encounter for immunization: Secondary | ICD-10-CM | POA: Diagnosis not present

## 2021-02-17 ENCOUNTER — Ambulatory Visit: Payer: Medicare Other | Admitting: Internal Medicine

## 2021-04-13 ENCOUNTER — Other Ambulatory Visit: Payer: Self-pay

## 2021-04-13 ENCOUNTER — Encounter: Payer: Self-pay | Admitting: Internal Medicine

## 2021-04-13 ENCOUNTER — Ambulatory Visit (INDEPENDENT_AMBULATORY_CARE_PROVIDER_SITE_OTHER): Payer: Medicare Other | Admitting: Internal Medicine

## 2021-04-13 VITALS — BP 140/77 | HR 61 | Ht 68.5 in | Wt 167.2 lb

## 2021-04-13 DIAGNOSIS — E785 Hyperlipidemia, unspecified: Secondary | ICD-10-CM

## 2021-04-13 DIAGNOSIS — E269 Hyperaldosteronism, unspecified: Secondary | ICD-10-CM | POA: Diagnosis not present

## 2021-04-13 DIAGNOSIS — I10 Essential (primary) hypertension: Secondary | ICD-10-CM

## 2021-04-13 DIAGNOSIS — R931 Abnormal findings on diagnostic imaging of heart and coronary circulation: Secondary | ICD-10-CM

## 2021-04-13 NOTE — Progress Notes (Signed)
OFFICE CONSULT NOTE  Chief Complaint:  Hypertension and dyslipidemia  Primary Care Physician: Shane Pretty, MD  HPI:  Shane Lam is a 70 y.o. male who is being seen today for the evaluation of hypertension and dyslipidemia at the request of Shane Pretty, MD. this is a pleasant 70 year old male kindly referred for evaluation management of hypertension and dyslipidemia.  Recently he has been having some difficulty with hypertension management.  He carries a diagnosis of Conn syndrome.  He was previously managed by endocrinologist and [facility] practice and has been on 50 mg spironolactone but apparently this caused issues with his renal function.  It also been having issues with hypokalemia related to hyperaldosteronism, requiring potassium supplementation.  He was on a mixed diuretic with triamterene/HCTZ however that did not seem to help his hypokalemia enough.  Blood pressure diary was reviewed by myself and indicates blood pressures generally between [id number] systolic.  Another issue reportedly was with bradycardia, precluding any beta-blocker or nondihydropyridine calcium channel blocker.  Shane Lam has had a lot of recent imaging including ultrasound which indicated fatty liver however his liver enzymes recently have been normal.  Calcium score was performed in March 2022 which showed a total of 114, 65th percentile for age and sex matched controls.  There was also a 4 mm nodule along the right minor fissure which will need follow-up.  The aorta measured 3.9 cm in the a sending portion, this should be followed up as well in a year.  PMHx:  Past Medical History:  Diagnosis Date   Allergic rhinitis due to pollen    Essential hypertension, malignant    Nonspecific abnormal electrocardiogram (ECG) (EKG)    Pure hypercholesterolemia     FAMHx:  Family History  Problem Relation Age of Onset   CAD Mother    CAD Father     SOCHx:   has no history on file for tobacco use, alcohol  use, and drug use.  ALLERGIES:  No Known Allergies  ROS: Pertinent items noted in HPI and remainder of comprehensive ROS otherwise negative.  HOME MEDS: Current Outpatient Medications on File Prior to Visit  Medication Sig Dispense Refill   Ascorbic Acid (VITAMIN C) 1000 MG tablet Take 1,000 mg by mouth daily.     Cholecalciferol (VITAMIN D3) 1000 UNITS CAPS Take by mouth daily.     doxazosin (CARDURA) 4 MG tablet Take 4 mg by mouth daily.       FELODIPINE PO Take 10 mg by mouth daily.     Multiple Vitamin (MULTIVITAMIN) tablet Take 1 tablet by mouth daily.       potassium chloride SA (KLOR-CON) 20 MEQ tablet 1 tablet with food     PROTEASE-BETAINE HCL PO      spironolactone (ALDACTONE) 25 MG tablet 1 tablet     telmisartan (MICARDIS) 80 MG tablet Take 1 tablet by mouth daily.     Zinc 100 MG TABS 1 tablet     aspirin 81 MG tablet Take 81 mg by mouth daily.   (Patient not taking: Reported on 04/13/2021)     Magnesium 400 MG CAPS See admin instructions.     Omega-3 Fatty Acids (OMEGA 3 PO) Take by mouth daily.   (Patient not taking: Reported on 04/13/2021)     TRIAMTERENE-HCTZ PO Take 25 mg by mouth daily.   (Patient not taking: Reported on 04/13/2021)     No current facility-administered medications on file prior to visit.    LABS/IMAGING: No results found for  this or any previous visit (from the past 48 hour(s)). No results found.  LIPID PANEL: No results found for: CHOL, TRIG, HDL, CHOLHDL, VLDL, LDLCALC, LDLDIRECT  WEIGHTS: Wt Readings from Last 3 Encounters:  04/13/21 167 lb 3.2 oz (75.8 kg)  07/17/12 188 lb (85.3 kg)  11/09/10 195 lb (88.5 kg)    VITALS: BP 140/77   Pulse 61   Ht 5' 8.5" (1.74 m)   Wt 167 lb 3.2 oz (75.8 kg)   SpO2 99%   BMI 25.05 kg/m   EXAM: General appearance: alert and no distress Neck: no carotid bruit, no JVD, and thyroid not enlarged, symmetric, no tenderness/mass/nodules Lungs: clear to auscultation bilaterally Heart: regular rate and  rhythm, S1, S2 normal, no murmur, click, rub or gallop Abdomen: soft, non-tender; bowel sounds normal; no masses,  no organomegaly Extremities: extremities normal, atraumatic, no cyanosis or edema Pulses: 2+ and symmetric Skin: Skin color, texture, turgor normal. No rashes or lesions Neurologic: Grossly normal Psych: Pleasant  EKG: Normal sinus rhythm at 61- personally reviewed  ASSESSMENT: Mixed dyslipidemia Conn syndrome/hyperaldosteronism Dilated ascending aorta to 39 mm Elevated CAC score of 114, 65th percentile for age and sex matched control Solitary pulmonary nodule  PLAN: 1.   Shane Lam has a mixed dyslipidemia.  Recent LDL P 1369, LDL-C 116, HDL 42, triglycerides 79 and total cholesterol 173.  This was in February but he has continued to lose weight.  He had actually lost somewhere around 40 pounds.  I would like to repeat his lipid NMR and would likely recommend statin therapy given his abnormal calcium score.  With regards to blood pressure, they will monitor it further.  It does not sound like we have ability to further titrate his medicines due to side effects and issues with worsening renal function.  His renin/aldosterone ratio appears to have been suppressed, but I suspect this was on 50 mg dose.  We could also consider hydralazine as this is more likely to increase heart rate and should be tolerated.  Plan follow-up with me further to discuss this.  Given his solitary pulmonary nodule and dilated ascending aorta, I would recommend a repeat CT scan (with contrast) to reevaluate the aorta in 1 year.  Thanks again for the kind referral.  Shane Casino, MD, FACC, Shane Lam Director of the [facility] Diplomate of the American Board of Clinical Lipidology Attending Cardiologist  Direct Dial: [phone]  Fax: [phone]  Website:  [email or web address] 04/13/2021,  9:17 PM

## 2021-04-13 NOTE — Patient Instructions (Signed)
Medication Instructions:  Any med changes will depend on your lab results  *If you need a refill on your cardiac medications before your next appointment, please call your pharmacy*   Lab Work: FASTING lab work -- NMR lipoprofile  If you have labs (blood work) drawn today and your tests are completely normal, you will receive your results only by: MyChart Message (if you have MyChart) OR A paper copy in the mail If you have any lab test that is abnormal or we need to change your treatment, we will call you to review the results.   Testing/Procedures: NONE   Follow-Up: At [facility], you and your health needs are our priority.  As part of our continuing mission to provide you with exceptional heart care, we have created designated Provider Care Teams.  These Care Teams include your primary Cardiologist (physician) and Advanced Practice Providers (APPs -  Physician Assistants and Nurse Practitioners) who all work together to provide you with the care you need, when you need it.  We recommend signing up for the patient portal called "MyChart".  Sign up information is provided on this After Visit Summary.  MyChart is used to connect with patients for Virtual Visits (Telemedicine).  Patients are able to view lab/test results, encounter notes, upcoming appointments, etc.  Non-urgent messages can be sent to your provider as well.   To learn more about what you can do with MyChart, go to [email or web address].    Your next appointment:   3 month(s)  The format for your next appointment:   In Person  Provider:   Dr. Lyman Bishop   Other Instructions

## 2021-04-17 DIAGNOSIS — E785 Hyperlipidemia, unspecified: Secondary | ICD-10-CM | POA: Diagnosis not present

## 2021-04-18 LAB — NMR, LIPOPROFILE
Cholesterol, Total: 166 mg/dL (ref 100–199)
HDL Particle Number: 26 umol/L — ABNORMAL LOW (ref >=30.5)
HDL-C: 41 mg/dL (ref >39)
LDL Particle Number: 1461 nmol/L — ABNORMAL HIGH (ref <1000)
LDL Size: 20.9 nm (ref >20.5)
LDL-C (NIH Calc): 114 mg/dL — ABNORMAL HIGH (ref 0–99)
LP-IR Score: 35 (ref <=45)
Small LDL Particle Number: 652 nmol/L — ABNORMAL HIGH (ref <=527)
Triglycerides: 52 mg/dL (ref 0–149)

## 2021-04-23 ENCOUNTER — Other Ambulatory Visit: Payer: Self-pay | Admitting: *Deleted

## 2021-04-23 DIAGNOSIS — E785 Hyperlipidemia, unspecified: Secondary | ICD-10-CM

## 2021-04-23 MED ORDER — ROSUVASTATIN CALCIUM 5 MG PO TABS: 5.0000 mg | ORAL_TABLET | Freq: Every day | ORAL | 3 refills | Status: DC

## 2021-04-28 ENCOUNTER — Other Ambulatory Visit: Payer: Self-pay | Admitting: *Deleted

## 2021-04-28 DIAGNOSIS — E785 Hyperlipidemia, unspecified: Secondary | ICD-10-CM

## 2021-06-12 DIAGNOSIS — Z23 Encounter for immunization: Secondary | ICD-10-CM | POA: Diagnosis not present

## 2021-06-22 DIAGNOSIS — R748 Abnormal levels of other serum enzymes: Secondary | ICD-10-CM | POA: Diagnosis not present

## 2021-06-22 DIAGNOSIS — N529 Male erectile dysfunction, unspecified: Secondary | ICD-10-CM | POA: Diagnosis not present

## 2021-06-22 DIAGNOSIS — R634 Abnormal weight loss: Secondary | ICD-10-CM | POA: Diagnosis not present

## 2021-06-22 DIAGNOSIS — R911 Solitary pulmonary nodule: Secondary | ICD-10-CM | POA: Diagnosis not present

## 2021-06-27 ENCOUNTER — Ambulatory Visit (HOSPITAL_COMMUNITY)
Admission: EM | Admit: 2021-06-27 | Discharge: 2021-06-27 | Disposition: A | Discharge status: Home / Self Care | Payer: Medicare Other | Attending: Emergency Medicine | Admitting: Emergency Medicine

## 2021-06-27 ENCOUNTER — Encounter (HOSPITAL_COMMUNITY): Payer: Self-pay

## 2021-06-27 ENCOUNTER — Other Ambulatory Visit: Payer: Self-pay

## 2021-06-27 DIAGNOSIS — I10 Essential (primary) hypertension: Secondary | ICD-10-CM | POA: Insufficient documentation

## 2021-06-27 DIAGNOSIS — R509 Fever, unspecified: Secondary | ICD-10-CM | POA: Insufficient documentation

## 2021-06-27 DIAGNOSIS — B349 Viral infection, unspecified: Secondary | ICD-10-CM | POA: Diagnosis not present

## 2021-06-27 DIAGNOSIS — Z20822 Contact with and (suspected) exposure to covid-19: Secondary | ICD-10-CM | POA: Diagnosis not present

## 2021-06-27 LAB — RESPIRATORY PANEL BY PCR

## 2021-06-27 LAB — POC INFLUENZA A AND B ANTIGEN (URGENT CARE ONLY)
INFLUENZA A ANTIGEN, POC: NEGATIVE
INFLUENZA B ANTIGEN, POC: NEGATIVE

## 2021-06-27 NOTE — ED Triage Notes (Signed)
Pt presents with fever and some lethargy since waking up this morning.

## 2021-06-27 NOTE — ED Provider Notes (Signed)
[facility]    CSN: [id number] Arrival date & time: 06/27/21  1716      History   Chief Complaint Chief Complaint  Patient presents with   Fever    HPI Shane Lam is a 70 y.o. male.   Patient woke up this morning with a temperature high states T-max was 103, has been giving him Tylenol for fever, pressure on arrival is 101, patient's blood pressure is elevated with a pulse of 101.  Wife states that 2 days ago they had lunch with a friend at a restaurant 2 days ago, friend is now in the hospital, she was diagnosed with pneumonia, wife states that the friend tested negative for COVID and flu.  Patient denies nausea, vomiting, diarrhea.  Patient endorses body aches, feeling very tired and having very little appetite.  The history is provided by the patient.   Past Medical History:  Diagnosis Date   Allergic rhinitis due to pollen    Essential hypertension, malignant    Nonspecific abnormal electrocardiogram (ECG) (EKG)    Pure hypercholesterolemia     There are no problems to display for this patient.   History reviewed. No pertinent surgical history.     Home Medications    Prior to Admission medications   Medication Sig Start Date End Date Taking? Authorizing Provider  Ascorbic Acid (VITAMIN C) 1000 MG tablet Take 1,000 mg by mouth daily.    [provider]  Cholecalciferol (VITAMIN D3) 1000 UNITS CAPS Take by mouth daily.    [provider]  doxazosin (CARDURA) 4 MG tablet Take 4 mg by mouth daily.      [provider]  FELODIPINE PO Take 10 mg by mouth daily.    [provider]  Magnesium 400 MG CAPS See admin instructions.    [provider]  Multiple Vitamin (MULTIVITAMIN) tablet Take 1 tablet by mouth daily.      [provider]  potassium chloride SA (KLOR-CON) 20 MEQ tablet 1 tablet with food    [provider]  PROTEASE-BETAINE HCL PO     [provider]  rosuvastatin  (CRESTOR) 5 MG tablet Take 1 tablet (5 mg total) by mouth daily. 04/23/21 07/22/21  Pixie Casino, MD  spironolactone (ALDACTONE) 25 MG tablet 1 tablet 11/21/20   [provider]  telmisartan (MICARDIS) 80 MG tablet Take 1 tablet by mouth daily. 10/27/20   [provider]  Zinc 100 MG TABS 1 tablet    [provider]    Family History Family History  Problem Relation Age of Onset   CAD Mother    CAD Father     Social History Social History   Tobacco Use   Smoking status: Never     Allergies   Patient has no known allergies.   Review of Systems Review of Systems Pertinent findings noted in history of present illness.    Physical Exam Triage Vital Signs ED Triage Vitals  Enc Vitals Group     BP 06/26/21 0827 (!) 147/82     Pulse Rate 06/26/21 0827 72     Resp 06/26/21 0827 18     Temp 06/26/21 0827 98.3 F (36.8 C)     Temp Source 06/26/21 0827 Oral     SpO2 06/26/21 0827 98 %     Weight --      Height --      Head Circumference --      Peak Flow --  Pain Score 06/26/21 0826 5     Pain Loc --      Pain Edu? --      Excl. in [place]? --    No data found.  Updated Vital Signs BP (!) 146/105 (BP Location: Right Arm)   Pulse (!) 101   Temp (!) 101 F (38.3 C) (Oral)   Resp 18   SpO2 100%   Visual Acuity Right Eye Distance:   Left Eye Distance:   Bilateral Distance:    Right Eye Near:   Left Eye Near:    Bilateral Near:     Physical Exam Vitals and nursing note reviewed.  Constitutional:      General: He is not in acute distress.    Appearance: Normal appearance. He is not ill-appearing.  HENT:     Head: Normocephalic and atraumatic.     Salivary Glands: Right salivary gland is diffusely enlarged and tender. Left salivary gland is diffusely enlarged and tender.     Nose: Congestion and rhinorrhea present. Rhinorrhea is clear.     Right Turbinates: Not enlarged, swollen or pale.     Left Turbinates: Not enlarged, swollen  or pale.     Right Sinus: No maxillary sinus tenderness or frontal sinus tenderness.     Left Sinus: No maxillary sinus tenderness or frontal sinus tenderness.     Mouth/Throat:     Lips: Pink.     Mouth: Mucous membranes are moist.     Pharynx: Uvula midline. Pharyngeal swelling, posterior oropharyngeal erythema and uvula swelling present. No oropharyngeal exudate.     Tonsils: No tonsillar exudate. 0 on the right. 0 on the left.  Eyes:     General: Lids are normal.        Right eye: No discharge.        Left eye: No discharge.     Extraocular Movements: Extraocular movements intact.     Conjunctiva/sclera: Conjunctivae normal.     Right eye: Right conjunctiva is not injected.     Left eye: Left conjunctiva is not injected.  Neck:     Trachea: Trachea and phonation normal.  Cardiovascular:     Rate and Rhythm: Normal rate and regular rhythm.     Pulses: Normal pulses.     Heart sounds: Normal heart sounds. No murmur heard.   No friction rub. No gallop.  Pulmonary:     Effort: Pulmonary effort is normal. No accessory muscle usage, prolonged expiration or respiratory distress.     Breath sounds: Normal breath sounds. No stridor, decreased air movement or transmitted upper airway sounds. No decreased breath sounds, wheezing, rhonchi or rales.  Chest:     Chest wall: No tenderness.  Musculoskeletal:        General: Normal range of motion.     Cervical back: Normal range of motion and neck supple. Normal range of motion.  Lymphadenopathy:     Cervical: Cervical adenopathy present.     Right cervical: Superficial cervical adenopathy, deep cervical adenopathy and posterior cervical adenopathy present.     Left cervical: Superficial cervical adenopathy, deep cervical adenopathy and posterior cervical adenopathy present.  Skin:    General: Skin is warm and dry.     Findings: No erythema or rash.  Neurological:     General: No focal deficit present.     Mental Status: He is alert and  oriented to person, place, and time.  Psychiatric:        Mood and Affect: Mood normal.  Behavior: Behavior normal.     UC Treatments / Results  Labs (all labs ordered are listed, but only abnormal results are displayed) Labs Reviewed  SARS CORONAVIRUS 2 (TAT 6-24 HRS)  RESPIRATORY PANEL BY PCR  POC INFLUENZA A AND B ANTIGEN (URGENT CARE ONLY)    EKG   Radiology No results found.  Procedures Procedures (including critical care time)  Medications Ordered in UC Medications - No data to display  Initial Impression / Assessment and Plan / UC Course  I have reviewed the triage vital signs and the nursing notes.  Pertinent labs & imaging results that were available during my care of the patient were reviewed by me and considered in my medical decision making (see chart for details).     Testing for COVID, RSV and influenza provided today, rapid flu test performed in the office was negative.  Conservative care recommended.  Patient verbalized understanding and agreement of plan as discussed.  All questions were addressed during visit.  Please see discharge instructions below for further details of plan.  Final Clinical Impressions(s) / UC Diagnoses   Final diagnoses:  Fever, unspecified fever cause  Viral syndrome     Discharge Instructions      While your influenza test today was negative, based on the history provided today, your fever and symptoms are most likely viral in etiology.  Conservative care is the mainstay of therapy for this. This includes rest, pushing clear fluids and activity as tolerated.  You may also noticed that your appetite is reduced, this is okay as long as you are drinking plenty of clear fluids.  Acetaminophen: This is a good fever reducer.  If your body temperature rises above 101.5 as measured with a thermometer, it is recommended that you take 1000 mg every 8 hours until your temperature falls below 101.5  Ibuprofen: This is a good  anti-inflammatory medication which addresses aches and pains and, to some degree, congestion in the nasal passages.  I recommend taking between 200 to 400 mg every 8 hours as needed.  Pseudoephedrine: This is a decongestant.  This medication has to be purchased from the pharmacist counter, I recommend taking 2 tablets, 60 mg, 2-3 times a day as needed to relieve runny nose and sinus drainage.  Guaifenesin: This is an expectorant.  This helps break up chest congestion and loosen up thick nasal drainage making phlegm and drainage more liquid and therefore easier to remove.  I recommend taking 400 mg 3 times daily as needed.  Dextromethorphan: This is a cough suppressant.  This is often recommended to be taken at nighttime to suppress cough and help people sleep.      ED Prescriptions   None    PDMP not reviewed this encounter.    Lynden Oxford Scales, [place] 06/29/21 [phone]

## 2021-06-27 NOTE — Discharge Instructions (Addendum)
While your influenza test today was negative, based on the history provided today, your fever and symptoms are most likely viral in etiology.  Conservative care is the mainstay of therapy for this. This includes rest, pushing clear fluids and activity as tolerated.  You may also noticed that your appetite is reduced, this is okay as long as you are drinking plenty of clear fluids.  Acetaminophen: This is a good fever reducer.  If your body temperature rises above 101.5 as measured with a thermometer, it is recommended that you take 1000 mg every 8 hours until your temperature falls below 101.5  Ibuprofen: This is a good anti-inflammatory medication which addresses aches and pains and, to some degree, congestion in the nasal passages.  I recommend taking between 200 to 400 mg every 8 hours as needed.  Pseudoephedrine: This is a decongestant.  This medication has to be purchased from the pharmacist counter, I recommend taking 2 tablets, 60 mg, 2-3 times a day as needed to relieve runny nose and sinus drainage.  Guaifenesin: This is an expectorant.  This helps break up chest congestion and loosen up thick nasal drainage making phlegm and drainage more liquid and therefore easier to remove.  I recommend taking 400 mg 3 times daily as needed.  Dextromethorphan: This is a cough suppressant.  This is often recommended to be taken at nighttime to suppress cough and help people sleep.

## 2021-06-28 LAB — SARS CORONAVIRUS 2 (TAT 6-24 HRS): SARS Coronavirus 2: NEGATIVE

## 2021-06-30 ENCOUNTER — Encounter (HOSPITAL_BASED_OUTPATIENT_CLINIC_OR_DEPARTMENT_OTHER): Payer: Self-pay | Admitting: *Deleted

## 2021-06-30 ENCOUNTER — Emergency Department (HOSPITAL_BASED_OUTPATIENT_CLINIC_OR_DEPARTMENT_OTHER)
Admission: EM | Admit: 2021-06-30 | Discharge: 2021-07-01 | Disposition: A | Discharge status: Home / Self Care | Payer: Medicare Other | Source: Home / Self Care | Attending: Student | Admitting: Student

## 2021-06-30 ENCOUNTER — Emergency Department (HOSPITAL_BASED_OUTPATIENT_CLINIC_OR_DEPARTMENT_OTHER): Payer: Medicare Other

## 2021-06-30 ENCOUNTER — Emergency Department (HOSPITAL_BASED_OUTPATIENT_CLINIC_OR_DEPARTMENT_OTHER): Payer: Medicare Other | Admitting: Radiology

## 2021-06-30 ENCOUNTER — Other Ambulatory Visit: Payer: Self-pay

## 2021-06-30 DIAGNOSIS — R630 Anorexia: Secondary | ICD-10-CM | POA: Diagnosis not present

## 2021-06-30 DIAGNOSIS — D734 Cyst of spleen: Secondary | ICD-10-CM | POA: Diagnosis not present

## 2021-06-30 DIAGNOSIS — G9341 Metabolic encephalopathy: Secondary | ICD-10-CM | POA: Diagnosis not present

## 2021-06-30 DIAGNOSIS — R109 Unspecified abdominal pain: Secondary | ICD-10-CM | POA: Insufficient documentation

## 2021-06-30 DIAGNOSIS — E871 Hypo-osmolality and hyponatremia: Secondary | ICD-10-CM | POA: Insufficient documentation

## 2021-06-30 DIAGNOSIS — E43 Unspecified severe protein-calorie malnutrition: Secondary | ICD-10-CM | POA: Diagnosis not present

## 2021-06-30 DIAGNOSIS — Z79899 Other long term (current) drug therapy: Secondary | ICD-10-CM | POA: Insufficient documentation

## 2021-06-30 DIAGNOSIS — K7689 Other specified diseases of liver: Secondary | ICD-10-CM | POA: Diagnosis not present

## 2021-06-30 DIAGNOSIS — D62 Acute posthemorrhagic anemia: Secondary | ICD-10-CM | POA: Diagnosis not present

## 2021-06-30 DIAGNOSIS — Z20822 Contact with and (suspected) exposure to covid-19: Secondary | ICD-10-CM | POA: Insufficient documentation

## 2021-06-30 DIAGNOSIS — K769 Liver disease, unspecified: Secondary | ICD-10-CM | POA: Diagnosis not present

## 2021-06-30 DIAGNOSIS — A419 Sepsis, unspecified organism: Secondary | ICD-10-CM | POA: Diagnosis not present

## 2021-06-30 DIAGNOSIS — D72829 Elevated white blood cell count, unspecified: Secondary | ICD-10-CM | POA: Insufficient documentation

## 2021-06-30 DIAGNOSIS — K862 Cyst of pancreas: Secondary | ICD-10-CM | POA: Diagnosis not present

## 2021-06-30 DIAGNOSIS — R509 Fever, unspecified: Secondary | ICD-10-CM | POA: Insufficient documentation

## 2021-06-30 DIAGNOSIS — I10 Essential (primary) hypertension: Secondary | ICD-10-CM | POA: Insufficient documentation

## 2021-06-30 DIAGNOSIS — J9811 Atelectasis: Secondary | ICD-10-CM | POA: Diagnosis not present

## 2021-06-30 DIAGNOSIS — R160 Hepatomegaly, not elsewhere classified: Secondary | ICD-10-CM

## 2021-06-30 DIAGNOSIS — D7389 Other diseases of spleen: Secondary | ICD-10-CM | POA: Diagnosis not present

## 2021-06-30 LAB — COMPREHENSIVE METABOLIC PANEL
ALT: 40 U/L (ref 0–44)
AST: 22 U/L (ref 15–41)
Albumin: 3.7 g/dL (ref 3.5–5.0)
Alkaline Phosphatase: 105 U/L (ref 38–126)
Anion gap: 8 (ref 5–15)
BUN: 20 mg/dL (ref 8–23)
CO2: 25 mmol/L (ref 22–32)
Calcium: 8.9 mg/dL (ref 8.9–10.3)
Chloride: 98 mmol/L (ref 98–111)
Creatinine, Ser: 1.37 mg/dL — ABNORMAL HIGH (ref 0.61–1.24)
GFR, Estimated: 55 mL/min — ABNORMAL LOW (ref >60)
Glucose, Bld: 113 mg/dL — ABNORMAL HIGH (ref 70–99)
Potassium: 4 mmol/L (ref 3.5–5.1)
Sodium: 131 mmol/L — ABNORMAL LOW (ref 135–145)
Total Bilirubin: 0.5 mg/dL (ref 0.3–1.2)
Total Protein: 7.1 g/dL (ref 6.5–8.1)

## 2021-06-30 LAB — URINALYSIS, ROUTINE W REFLEX MICROSCOPIC
Bilirubin Urine: NEGATIVE
Glucose, UA: NEGATIVE mg/dL
Ketones, ur: NEGATIVE mg/dL
Leukocytes,Ua: NEGATIVE
Nitrite: NEGATIVE
Specific Gravity, Urine: 1.023 (ref 1.005–1.030)
pH: 6 (ref 5.0–8.0)

## 2021-06-30 LAB — RESP PANEL BY RT-PCR (FLU A&B, COVID) ARPGX2
Influenza A by PCR: NEGATIVE
Influenza B by PCR: NEGATIVE
SARS Coronavirus 2 by RT PCR: NEGATIVE

## 2021-06-30 LAB — CBC
HCT: 40.2 % (ref 39.0–52.0)
Hemoglobin: 13.1 g/dL (ref 13.0–17.0)
MCH: 29 pg (ref 26.0–34.0)
MCHC: 32.6 g/dL (ref 30.0–36.0)
MCV: 88.9 fL (ref 80.0–100.0)
Platelets: 223 10*3/uL (ref 150–400)
RBC: 4.52 MIL/uL (ref 4.22–5.81)
RDW: 13.4 % (ref 11.5–15.5)
WBC: 12.1 10*3/uL — ABNORMAL HIGH (ref 4.0–10.5)
nRBC: 0 % (ref 0.0–0.2)

## 2021-06-30 MED ORDER — IOHEXOL 300 MG/ML  SOLN
100.0000 mL | Freq: Once | INTRAMUSCULAR | Status: AC | PRN
  Administered 2021-06-30: 100 mL via INTRAVENOUS

## 2021-06-30 NOTE — ED Triage Notes (Signed)
Fever started on Friday, Seen at [facility] and cleared without anything abnormal. Covid test x 3 which have been negative, yesterday nausea, today lack of appetite and more sleepy.Pt has had tylenol PTA

## 2021-06-30 NOTE — ED Provider Notes (Signed)
[facility] EMERGENCY DEPT Provider Note   CSN: [id number] Arrival date & time: 06/30/21  1754     History Chief Complaint  Patient presents with   Fever    Shane Lam is a 70 y.o. male with PMH HTN, HLD who presents emergency department for evaluation of fever.  Patient states that he has had a fever for 6 days and was seen at urgent care yesterday with negative flu and respiratory panel evaluation.  He endorses fatigue, loss of appetite and fever.  Wife states that they have had dietary modifications recently but the patient is lost 25 pounds in last 3 months.  Patient currently denies chest pain, cough, abdominal pain, diarrhea, dysuria or other systemic symptoms.   Fever Associated symptoms: no chest pain, no chills, no cough, no dysuria, no ear pain, no rash, no sore throat and no vomiting       Past Medical History:  Diagnosis Date   Allergic rhinitis due to pollen    Essential hypertension, malignant    Nonspecific abnormal electrocardiogram (ECG) (EKG)    Pure hypercholesterolemia     There are no problems to display for this patient.   History reviewed. No pertinent surgical history.     Family History  Problem Relation Age of Onset   CAD Mother    CAD Father     Social History   Tobacco Use   Smoking status: Never   Smokeless tobacco: Never  Vaping Use   Vaping Use: Never used  Substance Use Topics   Alcohol use: Never   Drug use: Never    Home Medications Prior to Admission medications   Medication Sig Start Date End Date Taking? Authorizing Provider  Ascorbic Acid (VITAMIN C) 1000 MG tablet Take 1,000 mg by mouth daily.    [provider]  Cholecalciferol (VITAMIN D3) 1000 UNITS CAPS Take by mouth daily.    [provider]  doxazosin (CARDURA) 4 MG tablet Take 4 mg by mouth daily.      [provider]  FELODIPINE PO Take 10 mg by mouth daily.    [provider]  Magnesium 400 MG CAPS See  admin instructions.    [provider]  Multiple Vitamin (MULTIVITAMIN) tablet Take 1 tablet by mouth daily.      [provider]  potassium chloride SA (KLOR-CON) 20 MEQ tablet 1 tablet with food    [provider]  PROTEASE-BETAINE HCL PO     [provider]  rosuvastatin (CRESTOR) 5 MG tablet Take 1 tablet (5 mg total) by mouth daily. 04/23/21 07/22/21  Pixie Casino, MD  spironolactone (ALDACTONE) 25 MG tablet 1 tablet 11/21/20   [provider]  telmisartan (MICARDIS) 80 MG tablet Take 1 tablet by mouth daily. 10/27/20   [provider]  Zinc 100 MG TABS 1 tablet    [provider]    Allergies    Patient has no known allergies.  Review of Systems   Review of Systems  Constitutional:  Positive for fatigue, fever and unexpected weight change. Negative for chills.  HENT:  Negative for ear pain and sore throat.   Eyes:  Negative for pain and visual disturbance.  Respiratory:  Negative for cough and shortness of breath.   Cardiovascular:  Negative for chest pain and palpitations.  Gastrointestinal:  Negative for abdominal pain and vomiting.  Genitourinary:  Negative for dysuria and hematuria.  Musculoskeletal:  Negative for arthralgias and back pain.  Skin:  Negative for  color change and rash.  Neurological:  Negative for seizures and syncope.  All other systems reviewed and are negative.  Physical Exam Updated Vital Signs BP 109/87 (BP Location: Right Arm)   Pulse 90   Temp (!) 101.5 F (38.6 C)   Resp 14   Ht 5' 8.5" (1.74 m)   Wt 69.9 kg   SpO2 98%   BMI 23.08 kg/m   Physical Exam Vitals and nursing note reviewed.  Constitutional:      Appearance: He is well-developed.  HENT:     Head: Normocephalic and atraumatic.  Eyes:     Conjunctiva/sclera: Conjunctivae normal.  Cardiovascular:     Rate and Rhythm: Normal rate and regular rhythm.     Heart sounds: No murmur heard. Pulmonary:     Effort:  Pulmonary effort is normal. No respiratory distress.     Breath sounds: Normal breath sounds.  Abdominal:     Palpations: Abdomen is soft.     Tenderness: There is no abdominal tenderness.  Musculoskeletal:     Cervical back: Neck supple.  Skin:    General: Skin is warm and dry.  Neurological:     Mental Status: He is alert.    ED Results / Procedures / Treatments   Labs (all labs ordered are listed, but only abnormal results are displayed) Labs Reviewed  COMPREHENSIVE METABOLIC PANEL - Abnormal; Notable for the following components:      Result Value   Sodium 131 (*)    Glucose, Bld 113 (*)    Creatinine, Ser 1.37 (*)    GFR, Estimated 55 (*)    All other components within normal limits  CBC - Abnormal; Notable for the following components:   WBC 12.1 (*)    All other components within normal limits  URINALYSIS, ROUTINE W REFLEX MICROSCOPIC - Abnormal; Notable for the following components:   Hgb urine dipstick TRACE (*)    Protein, ur TRACE (*)    Bacteria, UA RARE (*)    All other components within normal limits  RESP PANEL BY RT-PCR (FLU A&B, COVID) ARPGX2    EKG None  Radiology No results found.  Procedures Procedures   Medications Ordered in ED Medications - No data to display  ED Course  I have reviewed the triage vital signs and the nursing notes.  Pertinent labs & imaging results that were available during my care of the patient were reviewed by me and considered in my medical decision making (see chart for details).    MDM Rules/Calculators/A&P                           Patient is in the emergency department for evaluation of fever.  Physical exam unremarkable.  Laboratory evaluation with leukocytosis to 12.1, hyponatremia 131, urinalysis with a small amount of blood but is otherwise unremarkable, COVID and flu negative, LFTs unremarkable.  Due to the patient's history of fatigue and weight loss, a malignancy work-up was performed that shows diffusely  heterogenous liver parenchyma with innumerable low-density lesions, 9 mm small cysts in the pancreas that is likely benign, enlarged prostate with nodular projection into the bladder.  Patient's last PSA in February 2022 was normal.  PSA sent.  Blood cultures obtained and these will not result until tomorrow.  An ultrasound of the right upper quadrant was performed and is currently pending.  Patient then signed out to oncoming provider.  Anticipate discharge with follow-up on blood cultures and  need for outpatient MRI of the liver. Final Clinical Impression(s) / ED Diagnoses Final diagnoses:  None    Rx / DC Orders ED Discharge Orders     None        Ethon Wymer, Debe Coder, MD 07/01/21 0025

## 2021-06-30 NOTE — ED Notes (Signed)
Pt. In CT. 

## 2021-07-01 ENCOUNTER — Emergency Department (HOSPITAL_COMMUNITY): Payer: Medicare Other

## 2021-07-01 ENCOUNTER — Inpatient Hospital Stay (HOSPITAL_COMMUNITY)
Admission: EM | Admit: 2021-07-01 | Discharge: 2021-07-13 | DRG: 441 | Disposition: A | Discharge status: Home Health | Payer: Medicare Other | Attending: Family Medicine | Admitting: Family Medicine

## 2021-07-01 DIAGNOSIS — K862 Cyst of pancreas: Secondary | ICD-10-CM | POA: Diagnosis present

## 2021-07-01 DIAGNOSIS — I38 Endocarditis, valve unspecified: Secondary | ICD-10-CM | POA: Diagnosis not present

## 2021-07-01 DIAGNOSIS — R6520 Severe sepsis without septic shock: Secondary | ICD-10-CM | POA: Diagnosis not present

## 2021-07-01 DIAGNOSIS — R509 Fever, unspecified: Secondary | ICD-10-CM | POA: Diagnosis not present

## 2021-07-01 DIAGNOSIS — I10 Essential (primary) hypertension: Secondary | ICD-10-CM

## 2021-07-01 DIAGNOSIS — R627 Adult failure to thrive: Secondary | ICD-10-CM | POA: Diagnosis present

## 2021-07-01 DIAGNOSIS — N1832 Chronic kidney disease, stage 3b: Secondary | ICD-10-CM | POA: Diagnosis present

## 2021-07-01 DIAGNOSIS — D689 Coagulation defect, unspecified: Secondary | ICD-10-CM | POA: Diagnosis not present

## 2021-07-01 DIAGNOSIS — Z803 Family history of malignant neoplasm of breast: Secondary | ICD-10-CM

## 2021-07-01 DIAGNOSIS — D62 Acute posthemorrhagic anemia: Secondary | ICD-10-CM | POA: Diagnosis present

## 2021-07-01 DIAGNOSIS — N189 Chronic kidney disease, unspecified: Secondary | ICD-10-CM | POA: Diagnosis not present

## 2021-07-01 DIAGNOSIS — Z808 Family history of malignant neoplasm of other organs or systems: Secondary | ICD-10-CM

## 2021-07-01 DIAGNOSIS — D375 Neoplasm of uncertain behavior of rectum: Secondary | ICD-10-CM | POA: Diagnosis not present

## 2021-07-01 DIAGNOSIS — Z6823 Body mass index (BMI) 23.0-23.9, adult: Secondary | ICD-10-CM

## 2021-07-01 DIAGNOSIS — N281 Cyst of kidney, acquired: Secondary | ICD-10-CM | POA: Diagnosis present

## 2021-07-01 DIAGNOSIS — E871 Hypo-osmolality and hyponatremia: Secondary | ICD-10-CM | POA: Diagnosis present

## 2021-07-01 DIAGNOSIS — R634 Abnormal weight loss: Secondary | ICD-10-CM | POA: Diagnosis not present

## 2021-07-01 DIAGNOSIS — E861 Hypovolemia: Secondary | ICD-10-CM | POA: Diagnosis present

## 2021-07-01 DIAGNOSIS — G9341 Metabolic encephalopathy: Secondary | ICD-10-CM

## 2021-07-01 DIAGNOSIS — I700 Atherosclerosis of aorta: Secondary | ICD-10-CM | POA: Diagnosis present

## 2021-07-01 DIAGNOSIS — Z800 Family history of malignant neoplasm of digestive organs: Secondary | ICD-10-CM | POA: Diagnosis not present

## 2021-07-01 DIAGNOSIS — D7389 Other diseases of spleen: Secondary | ICD-10-CM | POA: Diagnosis not present

## 2021-07-01 DIAGNOSIS — Z8249 Family history of ischemic heart disease and other diseases of the circulatory system: Secondary | ICD-10-CM | POA: Diagnosis not present

## 2021-07-01 DIAGNOSIS — A419 Sepsis, unspecified organism: Secondary | ICD-10-CM

## 2021-07-01 DIAGNOSIS — R791 Abnormal coagulation profile: Secondary | ICD-10-CM | POA: Diagnosis not present

## 2021-07-01 DIAGNOSIS — R932 Abnormal findings on diagnostic imaging of liver and biliary tract: Secondary | ICD-10-CM | POA: Diagnosis not present

## 2021-07-01 DIAGNOSIS — N400 Enlarged prostate without lower urinary tract symptoms: Secondary | ICD-10-CM | POA: Diagnosis present

## 2021-07-01 DIAGNOSIS — R001 Bradycardia, unspecified: Secondary | ICD-10-CM

## 2021-07-01 DIAGNOSIS — E876 Hypokalemia: Secondary | ICD-10-CM | POA: Diagnosis present

## 2021-07-01 DIAGNOSIS — R6510 Systemic inflammatory response syndrome (SIRS) of non-infectious origin without acute organ dysfunction: Secondary | ICD-10-CM

## 2021-07-01 DIAGNOSIS — E43 Unspecified severe protein-calorie malnutrition: Secondary | ICD-10-CM | POA: Insufficient documentation

## 2021-07-01 DIAGNOSIS — R933 Abnormal findings on diagnostic imaging of other parts of digestive tract: Secondary | ICD-10-CM | POA: Diagnosis not present

## 2021-07-01 DIAGNOSIS — E7800 Pure hypercholesterolemia, unspecified: Secondary | ICD-10-CM | POA: Diagnosis present

## 2021-07-01 DIAGNOSIS — D734 Cyst of spleen: Secondary | ICD-10-CM | POA: Diagnosis present

## 2021-07-01 DIAGNOSIS — Z79899 Other long term (current) drug therapy: Secondary | ICD-10-CM

## 2021-07-01 DIAGNOSIS — R911 Solitary pulmonary nodule: Secondary | ICD-10-CM | POA: Diagnosis not present

## 2021-07-01 DIAGNOSIS — Z20822 Contact with and (suspected) exposure to covid-19: Secondary | ICD-10-CM | POA: Diagnosis present

## 2021-07-01 DIAGNOSIS — C787 Secondary malignant neoplasm of liver and intrahepatic bile duct: Secondary | ICD-10-CM | POA: Diagnosis not present

## 2021-07-01 DIAGNOSIS — K769 Liver disease, unspecified: Secondary | ICD-10-CM | POA: Diagnosis not present

## 2021-07-01 DIAGNOSIS — I129 Hypertensive chronic kidney disease with stage 1 through stage 4 chronic kidney disease, or unspecified chronic kidney disease: Secondary | ICD-10-CM | POA: Diagnosis present

## 2021-07-01 DIAGNOSIS — E561 Deficiency of vitamin K: Secondary | ICD-10-CM | POA: Diagnosis present

## 2021-07-01 DIAGNOSIS — Z801 Family history of malignant neoplasm of trachea, bronchus and lung: Secondary | ICD-10-CM | POA: Diagnosis not present

## 2021-07-01 DIAGNOSIS — Z86010 Personal history of colonic polyps: Secondary | ICD-10-CM | POA: Diagnosis not present

## 2021-07-01 DIAGNOSIS — J301 Allergic rhinitis due to pollen: Secondary | ICD-10-CM | POA: Diagnosis not present

## 2021-07-01 DIAGNOSIS — K7689 Other specified diseases of liver: Principal | ICD-10-CM | POA: Diagnosis present

## 2021-07-01 DIAGNOSIS — N401 Enlarged prostate with lower urinary tract symptoms: Secondary | ICD-10-CM | POA: Diagnosis not present

## 2021-07-01 DIAGNOSIS — D509 Iron deficiency anemia, unspecified: Secondary | ICD-10-CM | POA: Diagnosis not present

## 2021-07-01 DIAGNOSIS — R531 Weakness: Secondary | ICD-10-CM | POA: Diagnosis not present

## 2021-07-01 LAB — CBC WITH DIFFERENTIAL/PLATELET
Abs Immature Granulocytes: 0.06 10*3/uL (ref 0.00–0.07)
Basophils Absolute: 0 10*3/uL (ref 0.0–0.1)
Basophils Relative: 0 %
Eosinophils Absolute: 0.3 10*3/uL (ref 0.0–0.5)
Eosinophils Relative: 2 %
HCT: 39.8 % (ref 39.0–52.0)
Hemoglobin: 13.2 g/dL (ref 13.0–17.0)
Immature Granulocytes: 0 %
Lymphocytes Relative: 6 %
Lymphs Abs: 1.1 10*3/uL (ref 0.7–4.0)
MCH: 29.7 pg (ref 26.0–34.0)
MCHC: 33.2 g/dL (ref 30.0–36.0)
MCV: 89.4 fL (ref 80.0–100.0)
Monocytes Absolute: 0.9 10*3/uL (ref 0.1–1.0)
Monocytes Relative: 5 %
Neutro Abs: 15 10*3/uL — ABNORMAL HIGH (ref 1.7–7.7)
Neutrophils Relative %: 87 %
Platelets: 246 10*3/uL (ref 150–400)
RBC: 4.45 MIL/uL (ref 4.22–5.81)
RDW: 13.2 % (ref 11.5–15.5)
WBC: 17.3 10*3/uL — ABNORMAL HIGH (ref 4.0–10.5)
nRBC: 0 % (ref 0.0–0.2)

## 2021-07-01 LAB — COMPREHENSIVE METABOLIC PANEL
ALT: 38 U/L (ref 0–44)
AST: 27 U/L (ref 15–41)
Albumin: 3.2 g/dL — ABNORMAL LOW (ref 3.5–5.0)
Alkaline Phosphatase: 109 U/L (ref 38–126)
Anion gap: 8 (ref 5–15)
BUN: 15 mg/dL (ref 8–23)
CO2: 26 mmol/L (ref 22–32)
Calcium: 8.6 mg/dL — ABNORMAL LOW (ref 8.9–10.3)
Chloride: 96 mmol/L — ABNORMAL LOW (ref 98–111)
Creatinine, Ser: 1.43 mg/dL — ABNORMAL HIGH (ref 0.61–1.24)
GFR, Estimated: 52 mL/min — ABNORMAL LOW (ref >60)
Glucose, Bld: 107 mg/dL — ABNORMAL HIGH (ref 70–99)
Potassium: 4.2 mmol/L (ref 3.5–5.1)
Sodium: 130 mmol/L — ABNORMAL LOW (ref 135–145)
Total Bilirubin: 0.9 mg/dL (ref 0.3–1.2)
Total Protein: 6.7 g/dL (ref 6.5–8.1)

## 2021-07-01 LAB — LIPASE, BLOOD: Lipase: 25 U/L (ref 11–51)

## 2021-07-01 LAB — URINALYSIS, ROUTINE W REFLEX MICROSCOPIC
Bilirubin Urine: NEGATIVE
Glucose, UA: NEGATIVE mg/dL
Ketones, ur: NEGATIVE mg/dL
Leukocytes,Ua: NEGATIVE
Nitrite: NEGATIVE
Protein, ur: NEGATIVE mg/dL
Specific Gravity, Urine: 1.003 — ABNORMAL LOW (ref 1.005–1.030)
pH: 6 (ref 5.0–8.0)

## 2021-07-01 LAB — PROTIME-INR
INR: 1.4 — ABNORMAL HIGH (ref 0.8–1.2)
Prothrombin Time: 16.9 seconds — ABNORMAL HIGH (ref 11.4–15.2)

## 2021-07-01 LAB — AMMONIA: Ammonia: 26 umol/L (ref 9–35)

## 2021-07-01 LAB — PSA: Prostatic Specific Antigen: 0.51 ng/mL (ref 0.00–4.00)

## 2021-07-01 LAB — LACTIC ACID, PLASMA
Lactic Acid, Venous: 3 mmol/L (ref 0.5–1.9)
Lactic Acid, Venous: 1.8 mmol/L (ref 0.5–1.9)

## 2021-07-01 MED ORDER — PIPERACILLIN-TAZOBACTAM 3.375 G IVPB
3.3750 g | Freq: Three times a day (TID) | INTRAVENOUS | Status: DC
  Administered 2021-07-02 – 2021-07-04 (×7): 3.375 g via INTRAVENOUS
  Filled 2021-07-01 (×7): qty 50

## 2021-07-01 MED ORDER — PIPERACILLIN-TAZOBACTAM 3.375 G IVPB 30 MIN
3.3750 g | Freq: Once | INTRAVENOUS | Status: AC
  Administered 2021-07-01: 3.375 g via INTRAVENOUS
  Filled 2021-07-01: qty 50

## 2021-07-01 MED ORDER — DOXAZOSIN MESYLATE 4 MG PO TABS
4.0000 mg | ORAL_TABLET | Freq: Every day | ORAL | Status: DC
  Filled 2021-07-01 (×2): qty 1

## 2021-07-01 MED ORDER — SODIUM CHLORIDE 0.9 % IV BOLUS
2000.0000 mL | Freq: Once | INTRAVENOUS | Status: AC
  Administered 2021-07-01: 2000 mL via INTRAVENOUS

## 2021-07-01 MED ORDER — SODIUM CHLORIDE 0.9 % IV SOLN: INTRAVENOUS | Status: DC

## 2021-07-01 MED ORDER — ROSUVASTATIN CALCIUM 5 MG PO TABS
5.0000 mg | ORAL_TABLET | Freq: Every day | ORAL | Status: DC
  Filled 2021-07-01: qty 1

## 2021-07-01 MED ORDER — ACETAMINOPHEN 325 MG PO TABS
650.0000 mg | ORAL_TABLET | Freq: Once | ORAL | Status: AC
  Administered 2021-07-01: 650 mg via ORAL
  Filled 2021-07-01: qty 2

## 2021-07-01 MED ORDER — GADOBUTROL 1 MMOL/ML IV SOLN
7.0000 mL | Freq: Once | INTRAVENOUS | Status: AC | PRN
  Administered 2021-07-01: 7 mL via INTRAVENOUS

## 2021-07-01 MED ORDER — VANCOMYCIN HCL 1500 MG/300ML IV SOLN
1500.0000 mg | Freq: Once | INTRAVENOUS | Status: AC
  Administered 2021-07-01: 1500 mg via INTRAVENOUS
  Filled 2021-07-01: qty 300

## 2021-07-01 MED ORDER — ENOXAPARIN SODIUM 40 MG/0.4ML IJ SOSY
40.0000 mg | PREFILLED_SYRINGE | INTRAMUSCULAR | Status: DC
  Administered 2021-07-01 – 2021-07-04 (×4): 40 mg via SUBCUTANEOUS
  Filled 2021-07-01 (×5): qty 0.4

## 2021-07-01 MED ORDER — HYDRALAZINE HCL 25 MG PO TABS
25.0000 mg | ORAL_TABLET | Freq: Four times a day (QID) | ORAL | Status: DC | PRN
  Administered 2021-07-05: 25 mg via ORAL
  Filled 2021-07-01: qty 1

## 2021-07-01 MED ORDER — VANCOMYCIN HCL IN DEXTROSE 1-5 GM/200ML-% IV SOLN
1000.0000 mg | INTRAVENOUS | Status: DC
  Filled 2021-07-01: qty 200

## 2021-07-01 NOTE — ED Triage Notes (Signed)
Patient here for further evaluation of generalized weakness and fever. Patient seen at urgent care and has scheduled outpatient MRI with liver protocol but here requesting imaging be completed sooner. Patient alert, oriented ,and in no apparent distress at this time.

## 2021-07-01 NOTE — ED Notes (Signed)
Date and time results received: 07/01/21 1906 (use smartphrase ".now" to insert current time)  Test: Lactate Critical Value: 3  Name of Provider Notified: trifan

## 2021-07-01 NOTE — Progress Notes (Signed)
Pharmacy Antibiotic Note  Shane Lam is a 70 y.o. male admitted on 07/01/2021 with sepsis.  Pharmacy has been consulted for vancomycin and zosyn dosing.  Patient with a history of HTN, BPH, HLD. Patient presenting with fever and feeling tired.  SCr 1.43 - near baseline WBC 12.1 > 17.3; LA 3; T 102 F  Plan: Zosyn 3.375g IV q8h (4 hour infusion) Vancomycin 1500 mg once then 1000 mg q24hr (eAUC 461) unless change in renal function Trend WBC, Fever, Renal function, & Clinical course F/u cultures Levels at steady state De-escalate when able     Temp (24hrs), Avg:100.2 F (37.9 C), Min:99 F (37.2 C), Max:102.2 F (39 C)  Recent Labs  Lab 06/30/21 1844 07/01/21 1614  WBC 12.1* 17.3*  CREATININE 1.37*  --     Estimated Creatinine Clearance: 48.7 mL/min (A) (by C-G formula based on SCr of 1.37 mg/dL (H)).    No Known Allergies  Antimicrobials this admission: zosyn 11/2 >>  vancomycin 11/2 >>   Microbiology results: Pending  Thank you for allowing pharmacy to be a part of this patient's care.  Lorelei Pont, PharmD, BCPS 07/01/2021 5:09 PM ED Clinical Pharmacist -  [phone]

## 2021-07-01 NOTE — ED Notes (Signed)
Pt remains in MRI 

## 2021-07-01 NOTE — Discharge Instructions (Addendum)
Take Tylenol 1000 mg rotated with ibuprofen 600 mg every 4 hours as needed for fever.  Drink plenty of fluids and get plenty of rest.  We will call you if your blood cultures indicate you require further treatment or need to take additional action.  PSA test is pending and can be followed up with your primary doctor.  You will require an outpatient MRI as ordered and results to be followed up by your primary doctor.

## 2021-07-01 NOTE — ED Provider Notes (Signed)
Emergency Medicine Provider Triage Evaluation Note  Shane Lam , a 70 y.o. male  was evaluated in triage.  Pt presenting with his wife with a complaint of fevers since the end of last week.  Was seen at droppage and urgent care and had a lot of testing.  CT scan of abdomen recommended MRI to better characterize areas of the liver.  This was scheduled outpatient however wife is concerned about waiting that long because he appears sicker and sicker  Review of Systems  Positive: Fevers, nasal congestion Negative: Shortness of breath or chest pain  Physical Exam  BP 117/86 (BP Location: Right Arm)   Pulse (!) 110   Temp 99.6 F (37.6 C)   Resp 18   SpO2 97%  Gen:   Awake, ill-appearing Resp:  Normal effort  MSK:   Moves extremities without difficulty  Other:  Tachycardic, lung sounds clear  Medical Decision Making  Medically screening exam initiated at 10:21 AM.  Appropriate orders placed.  [name] was informed that the remainder of the evaluation will be completed by another provider, this initial triage assessment does not replace that evaluation, and the importance of remaining in the ED until their evaluation is complete.     Rhae Hammock, PA-C 07/01/21 1023    Sherwood Gambler, MD 07/01/21 1622

## 2021-07-01 NOTE — ED Provider Notes (Signed)
  Physical Exam  BP (!) 146/96   Pulse 81   Temp 99 F (37.2 C)   Resp 16   Ht 5' 8.5" (1.74 m)   Wt 69.9 kg   SpO2 92%   BMI 23.08 kg/m   Physical Exam Vitals and nursing note reviewed.  Constitutional:      General: He is not in acute distress.    Appearance: Normal appearance. He is well-developed. He is not ill-appearing or diaphoretic.  HENT:     Head: Normocephalic and atraumatic.  Cardiovascular:     Rate and Rhythm: Normal rate and regular rhythm.     Heart sounds: No murmur heard.   No friction rub.  Pulmonary:     Effort: Pulmonary effort is normal. No respiratory distress.     Breath sounds: Normal breath sounds. No wheezing or rales.  Abdominal:     General: Bowel sounds are normal. There is no distension.     Palpations: Abdomen is soft.     Tenderness: There is no abdominal tenderness.  Musculoskeletal:        General: Normal range of motion.     Cervical back: Normal range of motion and neck supple.  Skin:    General: Skin is warm and dry.  Neurological:     Mental Status: He is alert and oriented to person, place, and time.     Coordination: Coordination normal.    ED Course/Procedures     Procedures  MDM  Care assumed from Dr. Matilde Sprang at shift change.  Patient presenting here with weakness and fever.  Care signed out to me awaiting results of abdominal ultrasound and patient reassessment.  His ultrasound has returned showing slightly heterogeneous hepatic parenchyma with no sonographic finding of a focal hepatic lesion.  They continue to recommend MRI with liver protocol for further evaluation.  This can be done as an outpatient.  Patient has been reassessed and is resting comfortably.  His vital signs are stable and he is in no distress.  To my examination, I identify no obvious source of fever.  Blood cultures are pending and remainder of the other tests are unremarkable.  He does have a PSA pending, but seems appropriate for discharge.        Veryl Speak, MD 07/01/21 [phone]

## 2021-07-01 NOTE — ED Notes (Signed)
Pt back from MRI, wife at bedside, states that when pt eats or drinks he vomits.  Resps even and unlabored, pt denies pain

## 2021-07-01 NOTE — ED Provider Notes (Signed)
[facility] EMERGENCY DEPARTMENT Provider Note   CSN: [id number] Arrival date & time: 07/01/21  2505     History Chief Complaint  Patient presents with   Emesis    Shane Lam is a 70 y.o. male presenting to the emergency department in the company of his wife concern for nausea, vomiting, fevers and chills.  His wife reports that the fevers and chills began about 3 days ago.  He has been seen twice, once at an urgent care and once in the ER yesterday, and had a work-up including CT scan of the abdomen, CT scan of the brain, chest x-ray, and ultrasound of the abdomen.  These were notable for lesions in the liver which were concerning for possible malignancy, and a 9 mm pancreatic cysts, without gallbladder inflammation or biliary dilatation.  Chest x-ray showed minor right middle lobe atelectasis, no clear evidence of pneumonia.  CT scan of the brain was unremarkable.  Ultrasound of the gallbladder right upper quadrant did not show any evidence of acute biliary disease, CBD 4 mm.  Blood cultures were drawn yesterday.  The patient was discharged home.  Subsequently returns today with his wife reporting that he continues to have vomiting, that he has had increased lethargy, fevers and chills.  HPI     Past Medical History:  Diagnosis Date   Allergic rhinitis due to pollen    Essential hypertension, malignant    Nonspecific abnormal electrocardiogram (ECG) (EKG)    Pure hypercholesterolemia     Patient Active Problem List   Diagnosis Date Noted   Sepsis ([place]) 07/01/2021    No past surgical history on file.     Family History  Problem Relation Age of Onset   CAD Mother    CAD Father     Social History   Tobacco Use   Smoking status: Never   Smokeless tobacco: Never  Vaping Use   Vaping Use: Never used  Substance Use Topics   Alcohol use: Never   Drug use: Never    Home Medications Prior to Admission medications   Medication Sig Start Date End  Date Taking? Authorizing Provider  acetaminophen (TYLENOL) 500 MG tablet Take 1,000 mg by mouth every 6 (six) hours as needed for moderate pain.   Yes [provider]  Ascorbic Acid (VITAMIN C) 1000 MG tablet Take 1,000 mg by mouth daily.   Yes [provider]  Cholecalciferol (VITAMIN D3) 1000 UNITS CAPS Take 10,000 Units by mouth daily.   Yes [provider]  Coenzyme Q10 (CO Q 10) 100 MG CAPS Take 2 tablets by mouth daily at 6 (six) AM.   Yes [provider]  felodipine (PLENDIL) 10 MG 24 hr tablet Take 10 mg by mouth daily.   Yes [provider]  Magnesium 400 MG CAPS Take 400 mg by mouth daily.   Yes [provider]  Multiple Vitamin (MULTIVITAMIN) tablet Take 1 tablet by mouth daily.     Yes [provider]  potassium chloride SA (KLOR-CON) 20 MEQ tablet Take 20 mEq by mouth daily.   Yes [provider]  PROTEASE-BETAINE HCL PO Take 2 tablets by mouth daily.   Yes [provider]  spironolactone (ALDACTONE) 25 MG tablet Take 25 mg by mouth daily. 11/21/20  Yes [provider]  tadalafil (CIALIS) 20 MG tablet Take 20 mg by mouth daily as needed for erectile dysfunction. 06/22/21  Yes [provider]  telmisartan (MICARDIS) 80 MG tablet Take 80 mg  by mouth daily. 10/27/20  Yes [provider]  Zinc 100 MG TABS Take 100 mg by mouth daily.   Yes [provider]  rosuvastatin (CRESTOR) 5 MG tablet Take 1 tablet (5 mg total) by mouth daily. Patient not taking: No sig reported 04/23/21 07/22/21  Pixie Casino, MD    Allergies    Tomato  Review of Systems   Review of Systems  Constitutional:  Positive for appetite change, chills, fatigue, fever and unexpected weight change.  Eyes:  Negative for pain and visual disturbance.  Respiratory:  Negative for cough and shortness of breath.   Cardiovascular:  Negative for chest pain and palpitations.  Gastrointestinal:  Negative for  abdominal pain and vomiting.  Genitourinary:  Negative for dysuria and hematuria.  Musculoskeletal:  Negative for arthralgias and back pain.  Skin:  Negative for color change and rash.  Neurological:  Negative for syncope and headaches.  All other systems reviewed and are negative.  Physical Exam Updated Vital Signs BP 135/84   Pulse 73   Temp 99.6 F (37.6 C) (Oral)   Resp 20   SpO2 99%   Physical Exam Constitutional:      Appearance: He is ill-appearing.     Comments: Appears tired, ill  HENT:     Head: Normocephalic and atraumatic.  Eyes:     Conjunctiva/sclera: Conjunctivae normal.     Pupils: Pupils are equal, round, and reactive to light.  Cardiovascular:     Rate and Rhythm: Regular rhythm. Tachycardia present.     Pulses: Normal pulses.  Pulmonary:     Effort: Pulmonary effort is normal. No respiratory distress.     Breath sounds: Normal breath sounds.  Abdominal:     General: There is no distension.     Tenderness: There is no abdominal tenderness. There is no guarding or rebound.  Skin:    General: Skin is warm and dry.  Neurological:     General: No focal deficit present.     Mental Status: He is alert and oriented to person, place, and time. Mental status is at baseline.     Cranial Nerves: No cranial nerve deficit.  Psychiatric:        Mood and Affect: Mood normal.        Behavior: Behavior normal.    ED Results / Procedures / Treatments   Labs (all labs ordered are listed, but only abnormal results are displayed) Labs Reviewed  COMPREHENSIVE METABOLIC PANEL - Abnormal; Notable for the following components:      Result Value   Sodium 130 (*)    Chloride 96 (*)    Glucose, Bld 107 (*)    Creatinine, Ser 1.43 (*)    Calcium 8.6 (*)    Albumin 3.2 (*)    GFR, Estimated 52 (*)    All other components within normal limits  CBC WITH DIFFERENTIAL/PLATELET - Abnormal; Notable for the following components:   WBC 17.3 (*)    Neutro Abs 15.0 (*)    All  other components within normal limits  URINALYSIS, ROUTINE W REFLEX MICROSCOPIC - Abnormal; Notable for the following components:   Color, Urine COLORLESS (*)    Specific Gravity, Urine 1.003 (*)    Hgb urine dipstick SMALL (*)    Bacteria, UA RARE (*)    All other components within normal limits  PROTIME-INR - Abnormal; Notable for the following components:   Prothrombin Time 16.9 (*)    INR 1.4 (*)    All  other components within normal limits  LACTIC ACID, PLASMA - Abnormal; Notable for the following components:   Lactic Acid, Venous 3.0 (*)    All other components within normal limits  BASIC METABOLIC PANEL - Abnormal; Notable for the following components:   Sodium 134 (*)    Glucose, Bld 103 (*)    Creatinine, Ser 1.44 (*)    Calcium 8.4 (*)    GFR, Estimated 52 (*)    All other components within normal limits  PROTIME-INR - Abnormal; Notable for the following components:   Prothrombin Time 20.4 (*)    INR 1.7 (*)    All other components within normal limits  MRSA NEXT GEN BY PCR, NASAL  LIPASE, BLOOD  LACTIC ACID, PLASMA  AMMONIA  AFP TUMOR MARKER  CBC    EKG EKG Interpretation  Date/Time:  Wednesday July 01 2021 16:51:58 EDT Ventricular Rate:  92 PR Interval:  138 QRS Duration: 82 QT Interval:  348 QTC Calculation: 430 R Axis:   -1 Text Interpretation: Normal sinus rhythm Confirmed by Octaviano Glow [phone]) on 07/01/2021 5:27:17 PM  Radiology DG Chest 2 View  Result Date: 06/30/2021 CLINICAL DATA:  Fever. EXAM: CHEST - 2 VIEW COMPARISON:  Radiograph 10/10/2006 FINDINGS: The cardiomediastinal contours are normal. Minor right middle lobe atelectasis. Pulmonary vasculature is normal. No consolidation, pleural effusion, or pneumothorax. Thoracic spondylosis with endplate spurring. No acute osseous abnormalities are seen. IMPRESSION: Minor right middle lobe atelectasis.  No evidence of pneumonia Electronically Signed   By: Keith Rake M.D.   On: 06/30/2021  21:08   CT Head Wo Contrast  Result Date: 06/30/2021 CLINICAL DATA:  Fever started on Friday, Seen at [facility] and cleared without anything abnormal. Covid test x 3 which have been negative, yesterday nausea, today lack of appetite and more sleepy. EXAM: CT HEAD WITHOUT CONTRAST TECHNIQUE: Contiguous axial images were obtained from the base of the skull through the vertex without intravenous contrast. COMPARISON:  None. FINDINGS: Brain: No evidence of acute infarction, hemorrhage, hydrocephalus, extra-axial collection or mass lesion/mass effect. Vascular: No hyperdense vessel or unexpected calcification. Skull: Normal. Negative for fracture or focal lesion. Sinuses/Orbits: Globes and orbits are unremarkable. Visualized sinuses are clear. Other: None. IMPRESSION: Normal unenhanced CT scan of the brain. Electronically Signed   By: Lajean Manes M.D.   On: 06/30/2021 21:55   MR Abdomen W or Wo Contrast  Result Date: 07/01/2021 CLINICAL DATA:  Liver lesions seen on CT scan. EXAM: MRI ABDOMEN WITHOUT AND WITH CONTRAST TECHNIQUE: Multiplanar multisequence MR imaging of the abdomen was performed both before and after the administration of intravenous contrast. CONTRAST:  40mL GADAVIST GADOBUTROL 1 MMOL/ML IV SOLN COMPARISON:  CT scan 06/30/2021 FINDINGS: Examination is quite limited due to respiratory motion. The patient could not hold his breath for this examination. Lower chest: The lung bases grossly clear. No worrisome pulmonary lesions. No pleural or pericardial effusion. Hepatobiliary: Innumerable lesions throughout the spleen appear to be diffusion positive. No intrahepatic biliary dilatation. Normal caliber and course of the common bile duct. The gallbladder is unremarkable. Pancreas: 8 mm cyst is noted in the head body junction region the pancreas as seen on the CT scan. No worrisome MR imaging features. No ductal dilatation. Spleen:  Normal size.  Small splenic cyst noted. Adrenals/Urinary Tract: The adrenal  glands are unremarkable. Simple appearing renal cysts bilaterally. The largest cyst is on the right side and measures 10 cm. Stomach/Bowel: The stomach, duodenum, visualized small visualized colon are grossly. On the CT  scan could not exclude the possibility of a partially circumferential rectal mass. Recommend correlation with rectal exam. Vascular/Lymphatic: No aortic aneurysm or dissection. The major venous structures are patent. No mesenteric or retroperitoneal mass or adenopathy. Other:  No ascites or abdominal wall hernia. Musculoskeletal: 2 no significant bony findings. IMPRESSION: 1. Examination is quite limited due to respiratory motion. 2. Innumerable lesions throughout the liver appear to be diffusion positive. Findings worrisome for metastatic disease. 3. 8 mm cyst in the head body junction region the pancreas. No worrisome MR imaging features. Recommend follow-up MRI abdomen without and with contrast in 1 year. 4. On the CT scan could not exclude the possibility of a partially circumferential rectal mass. Recommend correlation with rectal exam. 5. PET-CT may be helpful for further evaluation. Electronically Signed   By: Marijo Sanes M.D.   On: 07/01/2021 16:07   CT ABDOMEN PELVIS W CONTRAST  Result Date: 06/30/2021 CLINICAL DATA:  Abdominal pain, fever and unintended weight loss. Concern for intra-abdominal malignancy. EXAM: CT ABDOMEN AND PELVIS WITH CONTRAST TECHNIQUE: Multidetector CT imaging of the abdomen and pelvis was performed using the standard protocol following bolus administration of intravenous contrast. CONTRAST:  159mL OMNIPAQUE IOHEXOL 300 MG/ML  SOLN COMPARISON:  Abdominal ultrasound 09/25/2018. Abdominal CT 01/22/2009 FINDINGS: Lower chest: Tiny platelet subpleural opacity in the right lower lobe which was faintly visualized on 2010 exam, considered benign. No basilar pulmonary nodule, effusion or focal airspace disease. The heart is normal in size. Hepatobiliary: The liver  parenchyma is diffusely heterogeneous with suggestion of innumerable low-density lesions. Largest suspected lesion in the left lobe measures 12 mm, series 2, image 15. nondistended gallbladder. No calcified gallstone or pericholecystic fat stranding. Pancreas: 9 mm cyst in the proximal pancreatic body, series 2, image 25. No ductal dilatation or inflammation. Spleen: Normal in size.  12 mm low-density in the inferior spleen. Adrenals/Urinary Tract: No adrenal nodule. No hydronephrosis. There are multiple bilateral cysts within both kidneys. This includes a dominant exophytic cyst arising from the lower right kidney that measures 11.1 cm. There is no internal complexity. No solid renal lesions. Urinary bladder is partially distended, no obvious bladder abnormality. Stomach/Bowel: Detailed bowel assessment is limited in the absence of enteric contrast as well as paucity of intra-abdominal fat. Stomach is decompressed further limiting assessment. There is no small bowel obstruction. No obvious small bowel inflammation the appendix is not discretely visualized moderate colonic stool burden. There is colonic redundancy. The splenic flexure of the colon is nondistended which limits assessment. Vascular/Lymphatic: Aortic atherosclerosis. No aortic aneurysm. Patent portal vein. No bulky abdominopelvic adenopathy, paucity of intra-abdominal fat limits assessment for adenopathy. Reproductive: Prominent prostate gland spanning 5.4 cm with nodular projection into the bladder base. Other: No ascites.  No free air.  No definite omental thickening. Musculoskeletal: Grade 1 anterolisthesis of L5 on S1. Unilateral left L5 pars defect. Slight heterogeneous appearance of the marrow but no discrete focal bone lesion. IMPRESSION: 1. Diffusely heterogeneous liver parenchyma with suggestion of innumerable low-density lesions. Recommend further characterization with hepatic protocol MRI. This should only be performed if patient is able to  tolerate breath hold technique. 2. Small cyst in the pancreas has benign CT features, 9 mm. Follow-up for a pancreatic cyst of this size in 5 years, if not further evaluated. 3. Multiple bilateral renal cysts. 4. Enlarged prostate gland with nodular projection into the bladder base. Recommend correlation with PSA. Aortic Atherosclerosis (ICD10-I70.0). Electronically Signed   By: Keith Rake M.D.   On: 06/30/2021 22:06  US Abdomen Limited RUQ (LIVER/GB)  Result Date: 06/30/2021 CLINICAL DATA:  Liver mass on CT. EXAM: ULTRASOUND ABDOMEN LIMITED RIGHT UPPER QUADRANT COMPARISON:  CT abdomen pelvis 06/30/2021 FINDINGS: Gallbladder: No gallstones or wall thickening visualized. No sonographic Murphy sign noted by sonographer. Common bile duct: Diameter: 4 mm. Liver: No focal hepatic lesion. Slightly heterogeneous parenchymal echogenicity. Portal vein is patent on color Doppler imaging with normal direction of blood flow towards the liver. Other: None. IMPRESSION: Slightly heterogeneous hepatic parenchyma with no sonographic finding of focal hepatic lesion. Recommend MRI liver protocol for further evaluation given CT abdomen pelvis 06/30/2021 findings. When the patient is clinically stable and able to follow directions and hold their breath (preferably as an outpatient) further evaluation with dedicated abdominal MRI should be considered. Electronically Signed   By: Iven Finn M.D.   On: 06/30/2021 23:41    Procedures .Critical Care Performed by: Wyvonnia Dusky, MD Authorized by: Wyvonnia Dusky, MD   Critical care provider statement:    Critical care time (minutes):  45   Critical care time was exclusive of:  Separately billable procedures and treating other patients   Critical care was necessary to treat or prevent imminent or life-threatening deterioration of the following conditions:  Sepsis   Critical care was time spent personally by me on the following activities:  Ordering and  performing treatments and interventions, ordering and review of laboratory studies, ordering and review of radiographic studies, pulse oximetry, review of old charts, examination of patient and evaluation of patient's response to treatment   Care discussed with: admitting provider     Medications Ordered in ED Medications  piperacillin-tazobactam (ZOSYN) IVPB 3.375 g (0 g Intravenous Stopped 07/01/21 1754)    Followed by  piperacillin-tazobactam (ZOSYN) IVPB 3.375 g (3.375 g Intravenous New Bag/Given 07/02/21 0928)  doxazosin (CARDURA) tablet 4 mg (4 mg Oral Patient Refused/Not Given 07/02/21 0926)  rosuvastatin (CRESTOR) tablet 5 mg (5 mg Oral Patient Refused/Not Given 07/02/21 0926)  enoxaparin (LOVENOX) injection 40 mg (40 mg Subcutaneous Given 07/01/21 2329)  0.9 %  sodium chloride infusion ( Intravenous Rate/Dose Verify 07/02/21 0505)  hydrALAZINE (APRESOLINE) tablet 25 mg (has no administration in time range)  vancomycin (VANCOCIN) IVPB 1000 mg/200 mL premix (has no administration in time range)  acetaminophen (TYLENOL) tablet 650 mg (650 mg Oral Given 07/02/21 0256)  gadobutrol (GADAVIST) 1 MMOL/ML injection 7 mL (7 mLs Intravenous Contrast Given 07/01/21 1539)  sodium chloride 0.9 % bolus 2,000 mL (0 mLs Intravenous Stopped 07/01/21 1814)  acetaminophen (TYLENOL) tablet 650 mg (650 mg Oral Given 07/01/21 1709)  vancomycin (VANCOREADY) IVPB 1500 mg/300 mL (0 mg Intravenous Stopped 07/01/21 2019)    ED Course  I have reviewed the triage vital signs and the nursing notes.  Pertinent labs & imaging results that were available during my care of the patient were reviewed by me and considered in my medical decision making (see chart for details).  Fever x 3-4 days, SIRS criteria Sepsis protocol initiated in ED 30 cc/kg bolus ordered for fluids BS antibiotics IV vanco + zosyn - unclear source at this time, but may be intraabdominal inflammation/infection per MRI abdomen obtained today  Records  from yesterday reviewed - GB and CBD unremarkable, CTH without evidence of mets, CT abd concerning for liver mets/lesions.  Xray chest and UA without evidence of infection.  Covid/flu negative and respiratory panel from 06/29/21 negative as well.    Blood cultures drawn yesterday and pending.  We should not need repeat  cultures at the moment.  Labs interpreted and reviewed today - WBC trending up from yesterday, LFT & lipase wnl, Cr minor trend up  Doubt meningitis - no headache, no neck pain, no photophobia.  Patient reported regular BM yesterday, passing gas - doubt bowel obstruction.  He is not belching or nauseous.  Possible rectal mass noted on MRI, but I would defer rectal exam until antibiotics have been given and inflammation is reduced, to avoid risk of perforation.    I discussed with the patient and his wife the concern for intraabdominal infection and likely malignancy seen on MRI.  This will need further workup, perhaps PET scan, which may be done inpatient or in close outpatient f/u with oncology.  For now we will stabilize his fever and concern for infection.  They verbalized understanding.  Clinical Course as of 07/02/21 0946  Wed Jul 01, 2021  1723 WBC(!): 17.3 [MT]  1807 Admitted to dr zhang hospitalist [MT]    Clinical Course User Index [MT] Langston Masker Carola Rhine, MD    Final Clinical Impression(s) / ED Diagnoses Final diagnoses:  Sepsis, due to unspecified organism, unspecified whether acute organ dysfunction present [facility])  Liver lesion    Rx / DC Orders ED Discharge Orders     None        Wyvonnia Dusky, MD 07/02/21 [phone]

## 2021-07-01 NOTE — H&P (Signed)
History and Physical    Shane Lam YJE:[id number] DOB: [birth year] DOA: 07/01/2021  PCP: [name], MD (Confirm with patient/family/NH records and if not entered, this has to be entered at [facility] point of entry) Patient coming from: Home  I have personally briefly reviewed patient's old medical records in [place]  Chief Complaint: Fever, feeling tired.  HPI: Shane Lam is a 70 y.o. male with medical history significant of HTN, BPH, HLD came with new onset of fever and generalized weakness and weight loss.  Patient started to have fever 5 to 6 days ago, with generalized weakness malaise and fatigue.  Fever with T-max 102-103, poor intake, denies any cough, no urinary symptoms, no diarrhea, no headache or neck pain.  Patient came to the ED at [facility], was worked up including blood culture, CT abdomen showed diffuse heterogeneous liver parenchymal changes suggested in numerous low-density lesions.  Was reassured and sent home.  Overnight, condition not improving, spiked fever again today around 2 at home and wife for patient to the ED again.  Patient admitted lost about 50 pounds since last year, partially attributed to diet but has been had a poor appetite recently, denied any nausea or vomiting abdominal pain, no diarrhea.  ED Course: Fever 102.3, tachycardia, blood pressure 120/80, blood work WBC 17.3, creatinine 1.4, lactic acid 3.0.  MRI of the abdominal, showed numerous liver lesions suspicious for metastatic lesions, circumferential thickness of rectum.  Patient was given vancomycin and Zosyn.  Review of Systems: As per HPI otherwise 14 point review of systems negative.    Past Medical History:  Diagnosis Date   Allergic rhinitis due to pollen    Essential hypertension, malignant    Nonspecific abnormal electrocardiogram (ECG) (EKG)    Pure hypercholesterolemia     No past surgical history on file.   reports that he has never smoked. He has  never used smokeless tobacco. He reports that he does not drink alcohol and does not use drugs.  Allergies  Allergen Reactions   Tomato Other (See Comments)    Unknown - whole tomato    Family History  Problem Relation Age of Onset   CAD Mother    CAD Father      Prior to Admission medications   Medication Sig Start Date End Date Taking? Authorizing Provider  Ascorbic Acid (VITAMIN C) 1000 MG tablet Take 1,000 mg by mouth daily.   Yes [provider]  Cholecalciferol (VITAMIN D3) 1000 UNITS CAPS Take 10,000 Units by mouth daily.   Yes [provider]  spironolactone (ALDACTONE) 25 MG tablet Take 25 mg by mouth daily. 11/21/20  Yes [provider]  telmisartan (MICARDIS) 80 MG tablet Take 80 mg by mouth daily. 10/27/20  Yes [provider]  Magnesium 400 MG CAPS See admin instructions.    [provider]  Multiple Vitamin (MULTIVITAMIN) tablet Take 1 tablet by mouth daily.      [provider]  potassium chloride SA (KLOR-CON) 20 MEQ tablet 1 tablet with food    [provider]  PROTEASE-BETAINE HCL PO     [provider]  rosuvastatin (CRESTOR) 5 MG tablet Take 1 tablet (5 mg total) by mouth daily. 04/23/21 07/22/21  Pixie Casino, MD  Zinc 100 MG TABS 1 tablet    [provider]    Physical Exam: Vitals:   07/01/21 1315 07/01/21 1626 07/01/21 1758 07/01/21 1832  BP: 126/88 (!) 127/92 129/84 119/80  Pulse: 97 95 89 82  Resp: 18 17 18 18   Temp: 99.1 F (37.3 C) (!) 102.2 F (39 C) (!) 102 F (38.9 C)   TempSrc: Oral Oral Oral   SpO2: 100% 98% 100% 100%    Constitutional: NAD, calm, comfortable Vitals:   07/01/21 1315 07/01/21 1626 07/01/21 1758 07/01/21 1832  BP: 126/88 (!) 127/92 129/84 119/80  Pulse: 97 95 89 82  Resp: 18 17 18 18   Temp: 99.1 F (37.3 C) (!) 102.2 F (39 C) (!) 102 F (38.9 C)   TempSrc: Oral Oral Oral   SpO2: 100% 98% 100% 100%   Eyes: PERRL, lids and  conjunctivae normal ENMT: Mucous membranes are moist. Posterior pharynx clear of any exudate or lesions.Normal dentition.  Neck: normal, supple, no masses, no thyromegaly Respiratory: clear to auscultation bilaterally, no wheezing, no crackles. Normal respiratory effort. No accessory muscle use.  Cardiovascular: Regular rate and rhythm, no murmurs / rubs / gallops. No extremity edema. 2+ pedal pulses. No carotid bruits.  Abdomen: no tenderness, no masses palpated. No hepatosplenomegaly. Bowel sounds positive.  Musculoskeletal: no clubbing / cyanosis. No joint deformity upper and lower extremities. Good ROM, no contractures. Normal muscle tone.  Skin: no rashes, lesions, ulcers. No induration Neurologic: CN 2-12 grossly intact. Sensation intact, DTR normal. Strength 5/5 in all 4.  Psychiatric: Normal judgment and insight. Alert and oriented x 3. Normal mood.     Labs on Admission: I have personally reviewed following labs and imaging studies  CBC: Recent Labs  Lab 06/30/21 1844 07/01/21 1614  WBC 12.1* 17.3*  NEUTROABS  --  15.0*  HGB 13.1 13.2  HCT 40.2 39.8  MCV 88.9 89.4  PLT 223 024   Basic Metabolic Panel: Recent Labs  Lab 06/30/21 1844 07/01/21 1614  NA 131* 130*  K 4.0 4.2  CL 98 96*  CO2 25 26  GLUCOSE 113* 107*  BUN 20 15  CREATININE 1.37* 1.43*  CALCIUM 8.9 8.6*   GFR: Estimated Creatinine Clearance: 46.6 mL/min (A) (by C-G formula based on SCr of 1.43 mg/dL (H)). Liver Function Tests: Recent Labs  Lab 06/30/21 1844 07/01/21 1614  AST 22 27  ALT 40 38  ALKPHOS 105 109  BILITOT 0.5 0.9  PROT 7.1 6.7  ALBUMIN 3.7 3.2*   Recent Labs  Lab 07/01/21 1626  LIPASE 25   No results for input(s): AMMONIA in the last 168 hours. Coagulation Profile: Recent Labs  Lab 07/01/21 1614  INR 1.4*   Cardiac Enzymes: No results for input(s): CKTOTAL, CKMB, CKMBINDEX, TROPONINI in the last 168 hours. BNP (last 3 results) No results for input(s): PROBNP in the  last 8760 hours. HbA1C: No results for input(s): HGBA1C in the last 72 hours. CBG: No results for input(s): GLUCAP in the last 168 hours. Lipid Profile: No results for input(s): CHOL, HDL, LDLCALC, TRIG, CHOLHDL, LDLDIRECT in the last 72 hours. Thyroid Function Tests: No results for input(s): TSH, T4TOTAL, FREET4, T3FREE, THYROIDAB in the last 72 hours. Anemia Panel: No results for input(s): VITAMINB12, FOLATE, FERRITIN, TIBC, IRON, RETICCTPCT in the last 72 hours. Urine analysis:    Component Value Date/Time   COLORURINE YELLOW 06/30/2021 1848   APPEARANCEUR CLEAR 06/30/2021 1848   LABSPEC 1.023 06/30/2021 1848   PHURINE 6.0 06/30/2021 1848   GLUCOSEU NEGATIVE 06/30/2021 1848   HGBUR TRACE (A) 06/30/2021 1848   BILIRUBINUR NEGATIVE 06/30/2021 1848   KETONESUR NEGATIVE 06/30/2021 1848   PROTEINUR TRACE (A) 06/30/2021 1848   NITRITE NEGATIVE 06/30/2021 1848   LEUKOCYTESUR NEGATIVE 06/30/2021  [place] on Admission: DG Chest 2 View  Result Date: 06/30/2021 CLINICAL DATA:  Fever. EXAM: CHEST - 2 VIEW COMPARISON:  Radiograph 10/10/2006 FINDINGS: The cardiomediastinal contours are normal. Minor right middle lobe atelectasis. Pulmonary vasculature is normal. No consolidation, pleural effusion, or pneumothorax. Thoracic spondylosis with endplate spurring. No acute osseous abnormalities are seen. IMPRESSION: Minor right middle lobe atelectasis.  No evidence of pneumonia Electronically Signed   By: Keith Rake M.D.   On: 06/30/2021 21:08   CT Head Wo Contrast  Result Date: 06/30/2021 CLINICAL DATA:  Fever started on Friday, Seen at [facility] and cleared without anything abnormal. Covid test x 3 which have been negative, yesterday nausea, today lack of appetite and more sleepy. EXAM: CT HEAD WITHOUT CONTRAST TECHNIQUE: Contiguous axial images were obtained from the base of the skull through the vertex without intravenous contrast. COMPARISON:  None. FINDINGS: Brain: No evidence  of acute infarction, hemorrhage, hydrocephalus, extra-axial collection or mass lesion/mass effect. Vascular: No hyperdense vessel or unexpected calcification. Skull: Normal. Negative for fracture or focal lesion. Sinuses/Orbits: Globes and orbits are unremarkable. Visualized sinuses are clear. Other: None. IMPRESSION: Normal unenhanced CT scan of the brain. Electronically Signed   By: Lajean Manes M.D.   On: 06/30/2021 21:55   MR Abdomen W or Wo Contrast  Result Date: 07/01/2021 CLINICAL DATA:  Liver lesions seen on CT scan. EXAM: MRI ABDOMEN WITHOUT AND WITH CONTRAST TECHNIQUE: Multiplanar multisequence MR imaging of the abdomen was performed both before and after the administration of intravenous contrast. CONTRAST:  46mL GADAVIST GADOBUTROL 1 MMOL/ML IV SOLN COMPARISON:  CT scan 06/30/2021 FINDINGS: Examination is quite limited due to respiratory motion. The patient could not hold his breath for this examination. Lower chest: The lung bases grossly clear. No worrisome pulmonary lesions. No pleural or pericardial effusion. Hepatobiliary: Innumerable lesions throughout the spleen appear to be diffusion positive. No intrahepatic biliary dilatation. Normal caliber and course of the common bile duct. The gallbladder is unremarkable. Pancreas: 8 mm cyst is noted in the head body junction region the pancreas as seen on the CT scan. No worrisome MR imaging features. No ductal dilatation. Spleen:  Normal size.  Small splenic cyst noted. Adrenals/Urinary Tract: The adrenal glands are unremarkable. Simple appearing renal cysts bilaterally. The largest cyst is on the right side and measures 10 cm. Stomach/Bowel: The stomach, duodenum, visualized small visualized colon are grossly. On the CT scan could not exclude the possibility of a partially circumferential rectal mass. Recommend correlation with rectal exam. Vascular/Lymphatic: No aortic aneurysm or dissection. The major venous structures are patent. No mesenteric or  retroperitoneal mass or adenopathy. Other:  No ascites or abdominal wall hernia. Musculoskeletal: 2 no significant bony findings. IMPRESSION: 1. Examination is quite limited due to respiratory motion. 2. Innumerable lesions throughout the liver appear to be diffusion positive. Findings worrisome for metastatic disease. 3. 8 mm cyst in the head body junction region the pancreas. No worrisome MR imaging features. Recommend follow-up MRI abdomen without and with contrast in 1 year. 4. On the CT scan could not exclude the possibility of a partially circumferential rectal mass. Recommend correlation with rectal exam. 5. PET-CT may be helpful for further evaluation. Electronically Signed   By: Marijo Sanes M.D.   On: 07/01/2021 16:07   CT ABDOMEN PELVIS W CONTRAST  Result Date: 06/30/2021 CLINICAL DATA:  Abdominal pain, fever and unintended weight loss. Concern for intra-abdominal malignancy. EXAM: CT ABDOMEN AND PELVIS WITH CONTRAST TECHNIQUE: Multidetector CT  imaging of the abdomen and pelvis was performed using the standard protocol following bolus administration of intravenous contrast. CONTRAST:  172mL OMNIPAQUE IOHEXOL 300 MG/ML  SOLN COMPARISON:  Abdominal ultrasound 09/25/2018. Abdominal CT 01/22/2009 FINDINGS: Lower chest: Tiny platelet subpleural opacity in the right lower lobe which was faintly visualized on 2010 exam, considered benign. No basilar pulmonary nodule, effusion or focal airspace disease. The heart is normal in size. Hepatobiliary: The liver parenchyma is diffusely heterogeneous with suggestion of innumerable low-density lesions. Largest suspected lesion in the left lobe measures 12 mm, series 2, image 15. nondistended gallbladder. No calcified gallstone or pericholecystic fat stranding. Pancreas: 9 mm cyst in the proximal pancreatic body, series 2, image 25. No ductal dilatation or inflammation. Spleen: Normal in size.  12 mm low-density in the inferior spleen. Adrenals/Urinary Tract: No  adrenal nodule. No hydronephrosis. There are multiple bilateral cysts within both kidneys. This includes a dominant exophytic cyst arising from the lower right kidney that measures 11.1 cm. There is no internal complexity. No solid renal lesions. Urinary bladder is partially distended, no obvious bladder abnormality. Stomach/Bowel: Detailed bowel assessment is limited in the absence of enteric contrast as well as paucity of intra-abdominal fat. Stomach is decompressed further limiting assessment. There is no small bowel obstruction. No obvious small bowel inflammation the appendix is not discretely visualized moderate colonic stool burden. There is colonic redundancy. The splenic flexure of the colon is nondistended which limits assessment. Vascular/Lymphatic: Aortic atherosclerosis. No aortic aneurysm. Patent portal vein. No bulky abdominopelvic adenopathy, paucity of intra-abdominal fat limits assessment for adenopathy. Reproductive: Prominent prostate gland spanning 5.4 cm with nodular projection into the bladder base. Other: No ascites.  No free air.  No definite omental thickening. Musculoskeletal: Grade 1 anterolisthesis of L5 on S1. Unilateral left L5 pars defect. Slight heterogeneous appearance of the marrow but no discrete focal bone lesion. IMPRESSION: 1. Diffusely heterogeneous liver parenchyma with suggestion of innumerable low-density lesions. Recommend further characterization with hepatic protocol MRI. This should only be performed if patient is able to tolerate breath hold technique. 2. Small cyst in the pancreas has benign CT features, 9 mm. Follow-up for a pancreatic cyst of this size in 5 years, if not further evaluated. 3. Multiple bilateral renal cysts. 4. Enlarged prostate gland with nodular projection into the bladder base. Recommend correlation with PSA. Aortic Atherosclerosis (ICD10-I70.0). Electronically Signed   By: Keith Rake M.D.   On: 06/30/2021 22:06   US Abdomen Limited RUQ  (LIVER/GB)  Result Date: 06/30/2021 CLINICAL DATA:  Liver mass on CT. EXAM: ULTRASOUND ABDOMEN LIMITED RIGHT UPPER QUADRANT COMPARISON:  CT abdomen pelvis 06/30/2021 FINDINGS: Gallbladder: No gallstones or wall thickening visualized. No sonographic Murphy sign noted by sonographer. Common bile duct: Diameter: 4 mm. Liver: No focal hepatic lesion. Slightly heterogeneous parenchymal echogenicity. Portal vein is patent on color Doppler imaging with normal direction of blood flow towards the liver. Other: None. IMPRESSION: Slightly heterogeneous hepatic parenchyma with no sonographic finding of focal hepatic lesion. Recommend MRI liver protocol for further evaluation given CT abdomen pelvis 06/30/2021 findings. When the patient is clinically stable and able to follow directions and hold their breath (preferably as an outpatient) further evaluation with dedicated abdominal MRI should be considered. Electronically Signed   By: Iven Finn M.D.   On: 06/30/2021 23:41    EKG: Independently reviewed. Sinus, no acute ST changes  Assessment/Plan Active Problems:   Sepsis ([place])  (please populate well all problems here in Problem List. (For example, if patient is  on BP meds at home and you resume or decide to hold them, it is a problem that needs to be her. Same for CAD, COPD, HLD and so on)  Sepsis -Unknown source, rule out bacteremia rule out endocarditis -Blood culture from yesterday still pending. -Continue broad spectrum coverage vancomycin and Zosyn -TTE to rule out obvious vegetation for now. -IVF and hold BP meds.  Metastatic liver CA with suspicious underlying rectal CA as primary -Discussed with GI Dr. [name] who reviewed office record of colonoscopy, when Dr. Collene Mares did colonoscopy in 2014 2019, both locations showed few polyps but no major suspicious lesions otherwise.  GI will see patient in the morning.  Likely colonoscopy and biopsy when patient is more stable and then consult  oncology.  HTN -Hold all home BP meds until sepsis resolves  BPH -Rather normal UA and PSA level yesterday despite CT reported enlarged prostate which put prostate CA as primary less likely.  Unintentional weight loss -Again suspect metastatic liver CA and underlying rectal CA  DVT prophylaxis: Lovenox Code Status: Full code Family Communication: Wife at bedside Disposition Plan: Expect more than 2 midnight hospital stay all. Consults called: GI Admission status: Tele admit   Lequita Halt MD [facility] Pager [phone]  07/01/2021, 7:44 PM

## 2021-07-01 NOTE — Sepsis Progress Note (Signed)
ELink monitoring sepsis protocol 

## 2021-07-02 ENCOUNTER — Encounter (HOSPITAL_COMMUNITY): Payer: Self-pay | Admitting: Internal Medicine

## 2021-07-02 ENCOUNTER — Inpatient Hospital Stay (HOSPITAL_COMMUNITY): Payer: Medicare Other

## 2021-07-02 DIAGNOSIS — R6520 Severe sepsis without septic shock: Secondary | ICD-10-CM

## 2021-07-02 DIAGNOSIS — A419 Sepsis, unspecified organism: Secondary | ICD-10-CM | POA: Diagnosis not present

## 2021-07-02 DIAGNOSIS — I38 Endocarditis, valve unspecified: Secondary | ICD-10-CM | POA: Diagnosis not present

## 2021-07-02 LAB — PROTIME-INR
INR: 1.7 — ABNORMAL HIGH (ref 0.8–1.2)
Prothrombin Time: 20.4 seconds — ABNORMAL HIGH (ref 11.4–15.2)

## 2021-07-02 LAB — BASIC METABOLIC PANEL
Anion gap: 8 (ref 5–15)
BUN: 14 mg/dL (ref 8–23)
CO2: 22 mmol/L (ref 22–32)
Calcium: 8.4 mg/dL — ABNORMAL LOW (ref 8.9–10.3)
Chloride: 104 mmol/L (ref 98–111)
Creatinine, Ser: 1.44 mg/dL — ABNORMAL HIGH (ref 0.61–1.24)
GFR, Estimated: 52 mL/min — ABNORMAL LOW (ref >60)
Glucose, Bld: 103 mg/dL — ABNORMAL HIGH (ref 70–99)
Potassium: 3.8 mmol/L (ref 3.5–5.1)
Sodium: 134 mmol/L — ABNORMAL LOW (ref 135–145)

## 2021-07-02 LAB — ECHOCARDIOGRAM COMPLETE
AR max vel: 1.71 cm2
AV Area VTI: 1.75 cm2
AV Area mean vel: 1.74 cm2
AV Mean grad: 8 mmHg
AV Peak grad: 16.2 mmHg
Ao pk vel: 2.01 m/s
Area-P 1/2: 3.27 cm2
S' Lateral: 3.1 cm

## 2021-07-02 LAB — MRSA NEXT GEN BY PCR, NASAL: MRSA by PCR Next Gen: NOT DETECTED

## 2021-07-02 MED ORDER — ACETAMINOPHEN 325 MG PO TABS
650.0000 mg | ORAL_TABLET | Freq: Four times a day (QID) | ORAL | Status: DC | PRN
  Administered 2021-07-02 – 2021-07-04 (×5): 650 mg via ORAL
  Filled 2021-07-02 (×5): qty 2

## 2021-07-02 MED ORDER — SODIUM CHLORIDE 0.9 % IV SOLN: INTRAVENOUS | Status: DC

## 2021-07-02 NOTE — Progress Notes (Signed)
  Echocardiogram 2D Echocardiogram has been performed.  Shane Lam F 07/02/2021, 1:11 PM

## 2021-07-02 NOTE — Plan of Care (Signed)
  Problem: Education: Goal: Knowledge of General Education information will improve Description Including pain rating scale, medication(s)/side effects and non-pharmacologic comfort measures Outcome: Progressing   

## 2021-07-02 NOTE — Progress Notes (Addendum)
PROGRESS NOTE  Shane Lam MWU:[id number] DOB: 04-08-1951 DOA: 07/01/2021 PCP: [name], MD  HPI/Recap of past 24 hours: Shane Lam is a 70 y.o. male with medical history significant of HTN, BPH, HLD came with new onset of fever and generalized weakness and unintentional weight loss more than 25 pounds in the past several months.  Work-up revealed innumerable lesions in the liver seen on CT scan, an MRI was suggested which revealed findings worrisome for metastatic disease.  8 mm cyst in the head body junction region of the pancreas recommendation for follow-up MRI abdomen without and with contrast in 1 year.  On the CT scan could not exclude the possibility of a partially circumferential rectal mass.  Recommended correlation with rectal exam.  PET/CT may be helpful for further evaluation.  GI consulted.  07/02/2021: Patient was seen and examined at his bedside in the ED with his wife present in the room.  Reports generalized weakness and feeling cold.  Assessment/Plan: Active Problems:   Sepsis ([place])  SIRS, no clear source of infection. WBC 17.3, T-max 102.4, respiration rate 30. No evidence of pneumonia on chest x-ray. No UTI on urinalysis. Blood cultures negative to date. He is on IV Zosyn and IV vancomycin empirically DC IV vancomycin for now. Obtain MRSA screen  Innumerable lesions in the liver seen on CT scan/MRI abdomen, worrisome for metastatic disease. GI consulted, appreciate assistance. LFTs are normal  Concern for possible partially circumferential rectal mass GI consulted, plan for procedure  Mild hypovolemic hyponatremia Serum sodium 134 Continue IV fluid hydration normal saline at 75 cc/h  CKD 3B Appears to be at his baseline creatinine 1.4 with GFR 52 Avoid nephrotoxic agent, dehydration and hypotension Continue IV fluid hydration Monitor urine output Repeat BMP in the morning  BPH Continue Cardura  Hyperlipidemia LFTs are within normal  limits. Holding off home Crestor 5 mg daily   Code Status: Full code  Family Communication: Wife at bedside  Disposition Plan: Likely will discharge to home once GI signs of   Consultants: GI  Procedures: None  Antimicrobials: IV vancomycin IV Zosyn  DVT prophylaxis: Subcu Lovenox daily  Status is: Inpatient  Patient will require at least 2 midnights for further evaluation and treatment of present condition.      Objective: Vitals:   07/02/21 1130 07/02/21 1200 07/02/21 1230 07/02/21 1254  BP: 132/80 118/72 117/74   Pulse: 76 64 77   Resp: 17 (!) 22 17   Temp: (!) 102.1 F (38.9 C)   98.6 F (37 C)  TempSrc:    Oral  SpO2: 99% 99% 97%     Intake/Output Summary (Last 24 hours) at 07/02/2021 1457 Last data filed at 07/02/2021 0324 Gross per 24 hour  Intake 2039.95 ml  Output 800 ml  Net 1239.95 ml   There were no vitals filed for this visit.  Exam:  General: 70 y.o. year-old male well developed well nourished in no acute distress.  Somnolent, easily arousable, oriented x3. Cardiovascular: Regular rate and rhythm with no rubs or gallops.  No thyromegaly or JVD noted.   Respiratory: Clear to auscultation with no wheezes or rales. Good inspiratory effort. Abdomen: Soft nontender nondistended with normal bowel sounds x4 quadrants. Musculoskeletal: No lower extremity edema. 2/4 pulses in all 4 extremities. Skin: No ulcerative lesions noted or rashes, Psychiatry: Mood is appropriate for condition and setting   Data Reviewed: CBC: Recent Labs  Lab 06/30/21 1844 07/01/21 1614  WBC 12.1* 17.3*  NEUTROABS  --  15.0*  HGB 13.1 13.2  HCT 40.2 39.8  MCV 88.9 89.4  PLT 223 259   Basic Metabolic Panel: Recent Labs  Lab 06/30/21 1844 07/01/21 1614 07/02/21 0252  NA 131* 130* 134*  K 4.0 4.2 3.8  CL 98 96* 104  CO2 25 26 22   GLUCOSE 113* 107* 103*  BUN 20 15 14   CREATININE 1.37* 1.43* 1.44*  CALCIUM 8.9 8.6* 8.4*   GFR: Estimated Creatinine  Clearance: 46.3 mL/min (A) (by C-G formula based on SCr of 1.44 mg/dL (H)). Liver Function Tests: Recent Labs  Lab 06/30/21 1844 07/01/21 1614  AST 22 27  ALT 40 38  ALKPHOS 105 109  BILITOT 0.5 0.9  PROT 7.1 6.7  ALBUMIN 3.7 3.2*   Recent Labs  Lab 07/01/21 1626  LIPASE 25   Recent Labs  Lab 07/01/21 1744  AMMONIA 26   Coagulation Profile: Recent Labs  Lab 07/01/21 1614 07/02/21 0252  INR 1.4* 1.7*   Cardiac Enzymes: No results for input(s): CKTOTAL, CKMB, CKMBINDEX, TROPONINI in the last 168 hours. BNP (last 3 results) No results for input(s): PROBNP in the last 8760 hours. HbA1C: No results for input(s): HGBA1C in the last 72 hours. CBG: No results for input(s): GLUCAP in the last 168 hours. Lipid Profile: No results for input(s): CHOL, HDL, LDLCALC, TRIG, CHOLHDL, LDLDIRECT in the last 72 hours. Thyroid Function Tests: No results for input(s): TSH, T4TOTAL, FREET4, T3FREE, THYROIDAB in the last 72 hours. Anemia Panel: No results for input(s): VITAMINB12, FOLATE, FERRITIN, TIBC, IRON, RETICCTPCT in the last 72 hours. Urine analysis:    Component Value Date/Time   COLORURINE COLORLESS (A) 07/01/2021 1941   APPEARANCEUR CLEAR 07/01/2021 1941   LABSPEC 1.003 (L) 07/01/2021 1941   PHURINE 6.0 07/01/2021 1941   GLUCOSEU NEGATIVE 07/01/2021 1941   HGBUR SMALL (A) 07/01/2021 1941   BILIRUBINUR NEGATIVE 07/01/2021 1941   KETONESUR NEGATIVE 07/01/2021 1941   PROTEINUR NEGATIVE 07/01/2021 1941   NITRITE NEGATIVE 07/01/2021 1941   LEUKOCYTESUR NEGATIVE 07/01/2021 1941   Sepsis Labs: @LABRCNTIP (procalcitonin:4,lacticidven:4)  ) Recent Results (from the past 240 hour(s))  SARS CORONAVIRUS 2 (TAT 6-24 HRS) Nasopharyngeal Nasopharyngeal Swab     Status: None   Collection Time: 06/27/21  6:37 PM   Specimen: Nasopharyngeal Swab  Result Value Ref Range Status   SARS Coronavirus 2 NEGATIVE NEGATIVE Final    Comment: (NOTE) SARS-CoV-2 target nucleic acids are NOT  DETECTED.  The SARS-CoV-2 RNA is generally detectable in upper and lower respiratory specimens during the acute phase of infection. Negative results do not preclude SARS-CoV-2 infection, do not rule out co-infections with other pathogens, and should not be used as the sole basis for treatment or other patient management decisions. Negative results must be combined with clinical observations, patient history, and epidemiological information. The expected result is Negative.  Fact Sheet for Patients: SugarRoll.be  Fact Sheet for Healthcare Providers: https://www.woods-mathews.com/  This test is not yet approved or cleared by the [place] FDA and  has been authorized for detection and/or diagnosis of SARS-CoV-2 by FDA under an Emergency Use Authorization (EUA). This EUA will remain  in effect (meaning this test can be used) for the duration of the COVID-19 declaration under Se ction 564(b)(1) of the Act, 21 U.S.C. section 360bbb-3(b)(1), unless the authorization is terminated or revoked sooner.  Performed at [facility], [place] [address] [place], [place] [address]   Respiratory (~20 pathogens) panel by PCR     Status: None   Collection  Time: 06/27/21  6:46 PM   Specimen: Nasopharyngeal Swab; Respiratory  Result Value Ref Range Status   Adenovirus NOT DETECTED NOT DETECTED Final   Coronavirus 229E NOT DETECTED NOT DETECTED Final    Comment: (NOTE) The Coronavirus on the Respiratory Panel, DOES NOT test for the novel  Coronavirus (2019 nCoV)    Coronavirus HKU1 NOT DETECTED NOT DETECTED Final   Coronavirus NL63 NOT DETECTED NOT DETECTED Final   Coronavirus OC43 NOT DETECTED NOT DETECTED Final   Metapneumovirus NOT DETECTED NOT DETECTED Final   Rhinovirus / Enterovirus NOT DETECTED NOT DETECTED Final   Influenza A NOT DETECTED NOT DETECTED Final   Influenza B NOT DETECTED NOT DETECTED Final   Parainfluenza Virus 1 NOT DETECTED  NOT DETECTED Final   Parainfluenza Virus 2 NOT DETECTED NOT DETECTED Final   Parainfluenza Virus 3 NOT DETECTED NOT DETECTED Final   Parainfluenza Virus 4 NOT DETECTED NOT DETECTED Final   Respiratory Syncytial Virus NOT DETECTED NOT DETECTED Final   Bordetella pertussis NOT DETECTED NOT DETECTED Final   Bordetella Parapertussis NOT DETECTED NOT DETECTED Final   Chlamydophila pneumoniae NOT DETECTED NOT DETECTED Final   Mycoplasma pneumoniae NOT DETECTED NOT DETECTED Final    Comment: Performed at [facility], [place] [address], [place], [place] [address]  Resp Panel by RT-PCR (Flu A&B, Covid) Nasopharyngeal Swab     Status: None   Collection Time: 06/30/21  6:45 PM   Specimen: Nasopharyngeal Swab; Nasopharyngeal(NP) swabs in vial transport medium  Result Value Ref Range Status   SARS Coronavirus 2 by RT PCR NEGATIVE NEGATIVE Final    Comment: (NOTE) SARS-CoV-2 target nucleic acids are NOT DETECTED.  The SARS-CoV-2 RNA is generally detectable in upper respiratory specimens during the acute phase of infection. The lowest concentration of SARS-CoV-2 viral copies this assay can detect is 138 copies/mL. A negative result does not preclude SARS-Cov-2 infection and should not be used as the sole basis for treatment or other patient management decisions. A negative result may occur with  improper specimen collection/handling, submission of specimen other than nasopharyngeal swab, presence of viral mutation(s) within the areas targeted by this assay, and inadequate number of viral copies(<138 copies/mL). A negative result must be combined with clinical observations, patient history, and epidemiological information. The expected result is Negative.  Fact Sheet for Patients:  EntrepreneurPulse.com.au  Fact Sheet for Healthcare Providers:  IncredibleEmployment.be  This test is no t yet approved or cleared by the [place] FDA and  has been  authorized for detection and/or diagnosis of SARS-CoV-2 by FDA under an Emergency Use Authorization (EUA). This EUA will remain  in effect (meaning this test can be used) for the duration of the COVID-19 declaration under Section 564(b)(1) of the Act, 21 U.S.C.section 360bbb-3(b)(1), unless the authorization is terminated  or revoked sooner.       Influenza A by PCR NEGATIVE NEGATIVE Final   Influenza B by PCR NEGATIVE NEGATIVE Final    Comment: (NOTE) The Xpert Xpress SARS-CoV-2/FLU/RSV plus assay is intended as an aid in the diagnosis of influenza from Nasopharyngeal swab specimens and should not be used as a sole basis for treatment. Nasal washings and aspirates are unacceptable for Xpert Xpress SARS-CoV-2/FLU/RSV testing.  Fact Sheet for Patients: EntrepreneurPulse.com.au  Fact Sheet for Healthcare Providers: IncredibleEmployment.be  This test is not yet approved or cleared by the [place] FDA and has been authorized for detection and/or diagnosis of SARS-CoV-2 by FDA under an Emergency Use Authorization (EUA). This EUA  will remain in effect (meaning this test can be used) for the duration of the COVID-19 declaration under Section 564(b)(1) of the Act, 21 U.S.C. section 360bbb-3(b)(1), unless the authorization is terminated or revoked.  Performed at [facility], [address] [place], [place] [address]   Blood culture (routine x 2)     Status: None (Preliminary result)   Collection Time: 06/30/21  9:20 PM   Specimen: BLOOD  Result Value Ref Range Status   Specimen Description   Final    BLOOD BOTTLES DRAWN AEROBIC AND ANAEROBIC Performed at [facility], [address] [place], [place] [address]    Special Requests   Final    Blood Culture adequate volume LEFT ANTECUBITAL Performed at [facility], [address] [place], [place] [address]    Culture   Final     NO GROWTH < 24 HOURS Performed at [facility], [place] [address], [place], [place] [address]    Report Status PENDING  Incomplete  Blood culture (routine x 2)     Status: None (Preliminary result)   Collection Time: 06/30/21  9:30 PM   Specimen: BLOOD  Result Value Ref Range Status   Specimen Description   Final    BLOOD BOTTLES DRAWN AEROBIC AND ANAEROBIC Performed at [facility], [address], [place], [place] [address]    Special Requests   Final    Blood Culture adequate volume RIGHT ANTECUBITAL Performed at [facility], [address], [place], [place] [address]    Culture   Final    NO GROWTH < 24 HOURS Performed at [facility], [place] [address] [place], [place] [address]    Report Status PENDING  Incomplete      Studies: MR Abdomen W or Wo Contrast  Result Date: 07/01/2021 CLINICAL DATA:  Liver lesions seen on CT scan. EXAM: MRI ABDOMEN WITHOUT AND WITH CONTRAST TECHNIQUE: Multiplanar multisequence MR imaging of the abdomen was performed both before and after the administration of intravenous contrast. CONTRAST:  41mL GADAVIST GADOBUTROL 1 MMOL/ML IV SOLN COMPARISON:  CT scan 06/30/2021 FINDINGS: Examination is quite limited due to respiratory motion. The patient could not hold his breath for this examination. Lower chest: The lung bases grossly clear. No worrisome pulmonary lesions. No pleural or pericardial effusion. Hepatobiliary: Innumerable lesions throughout the spleen appear to be diffusion positive. No intrahepatic biliary dilatation. Normal caliber and course of the common bile duct. The gallbladder is unremarkable. Pancreas: 8 mm cyst is noted in the head body junction region the pancreas as seen on the CT scan. No worrisome MR imaging features. No ductal dilatation. Spleen:  Normal size.  Small splenic cyst noted. Adrenals/Urinary Tract: The adrenal glands are unremarkable. Simple appearing renal cysts bilaterally. The  largest cyst is on the right side and measures 10 cm. Stomach/Bowel: The stomach, duodenum, visualized small visualized colon are grossly. On the CT scan could not exclude the possibility of a partially circumferential rectal mass. Recommend correlation with rectal exam. Vascular/Lymphatic: No aortic aneurysm or dissection. The major venous structures are patent. No mesenteric or retroperitoneal mass or adenopathy. Other:  No ascites or abdominal wall hernia. Musculoskeletal: 2 no significant bony findings. IMPRESSION: 1. Examination is quite limited due to respiratory motion. 2. Innumerable lesions throughout the liver appear to be diffusion positive. Findings worrisome for metastatic disease. 3. 8 mm cyst in the head body junction region the pancreas. No worrisome MR imaging features. Recommend follow-up MRI abdomen without  and with contrast in 1 year. 4. On the CT scan could not exclude the possibility of a partially circumferential rectal mass. Recommend correlation with rectal exam. 5. PET-CT may be helpful for further evaluation. Electronically Signed   By: Marijo Sanes M.D.   On: 07/01/2021 16:07    Scheduled Meds:  doxazosin  4 mg Oral Daily   enoxaparin (LOVENOX) injection  40 mg Subcutaneous Q24H   rosuvastatin  5 mg Oral Daily    Continuous Infusions:  sodium chloride 125 mL/hr at 07/02/21 1435   piperacillin-tazobactam (ZOSYN)  IV Stopped (07/02/21 1435)   vancomycin       LOS: 1 day     Kayleen Memos, MD [facility] Pager [phone]  If 7PM-7AM, please contact night-coverage [email or web address] Password [facility] 07/02/2021, 2:57 PM

## 2021-07-02 NOTE — ED Notes (Signed)
RN paged night time MD concerning patient elevated temp with no PRNs for temp management. RN contacted on-call, Howerter. PRN orders received.

## 2021-07-02 NOTE — Consult Note (Signed)
Reason for Consult: ? Rectal mass Referring Physician: [facility]  [place] HPI: This is a 70 year old male with a PMH of HTN admitted for fever for the past week and a 25 lbs weight loss over the past several months.  He presented to the ER and a work up was performed with a CT scan.  The scan suggested innumerable lesions in the liver and an MRI was recommended.  He was sent home, but his wife was concerned about waiting for an outpatient MRI.  The MRI was performed and it shows metastatic disease in the liver and rectal wall thickening.  At home he was experiencing temperatures at 102-103 F.  The fever seemed to be cyclical in the AM and the PM.  He had two relatively recent colonoscopies with Dr. Collene Mares on 06/06/2013 and 07/17/2018.  The index colonoscopy showed a small descending colon tubular adenoma and the follow up colonoscopy was essentially normal.  There was no recent report of any hematochezia or diarrhea.  Past Medical History:  Diagnosis Date   Allergic rhinitis due to pollen    Essential hypertension, malignant    Nonspecific abnormal electrocardiogram (ECG) (EKG)    Pure hypercholesterolemia     No past surgical history on file.  Family History  Problem Relation Age of Onset   CAD Mother    CAD Father     Social History:  reports that he has never smoked. He has never used smokeless tobacco. He reports that he does not drink alcohol and does not use drugs.  Allergies:  Allergies  Allergen Reactions   Tomato Other (See Comments)    Unknown - whole tomato    Medications: Scheduled:  doxazosin  4 mg Oral Daily   enoxaparin (LOVENOX) injection  40 mg Subcutaneous Q24H   rosuvastatin  5 mg Oral Daily   Continuous:  sodium chloride 125 mL/hr at 07/02/21 0505   piperacillin-tazobactam (ZOSYN)  IV 3.375 g (07/02/21 0928)   vancomycin      Results for orders placed or performed during the hospital encounter of 07/01/21 (from the past 24 hour(s))   Comprehensive metabolic panel     Status: Abnormal   Collection Time: 07/01/21  4:14 PM  Result Value Ref Range   Sodium 130 (L) 135 - 145 mmol/L   Potassium 4.2 3.5 - 5.1 mmol/L   Chloride 96 (L) 98 - 111 mmol/L   CO2 26 22 - 32 mmol/L   Glucose, Bld 107 (H) 70 - 99 mg/dL   BUN 15 8 - 23 mg/dL   Creatinine, Ser 1.43 (H) 0.61 - 1.24 mg/dL   Calcium 8.6 (L) 8.9 - 10.3 mg/dL   Total Protein 6.7 6.5 - 8.1 g/dL   Albumin 3.2 (L) 3.5 - 5.0 g/dL   AST 27 15 - 41 U/L   ALT 38 0 - 44 U/L   Alkaline Phosphatase 109 38 - 126 U/L   Total Bilirubin 0.9 0.3 - 1.2 mg/dL   GFR, Estimated 52 (L) >60 mL/min   Anion gap 8 5 - 15  CBC with Differential     Status: Abnormal   Collection Time: 07/01/21  4:14 PM  Result Value Ref Range   WBC 17.3 (H) 4.0 - 10.5 K/uL   RBC 4.45 4.22 - 5.81 MIL/uL   Hemoglobin 13.2 13.0 - 17.0 g/dL   HCT 39.8 39.0 - 52.0 %   MCV 89.4 80.0 - 100.0 fL   MCH 29.7 26.0 - 34.0 pg  MCHC 33.2 30.0 - 36.0 g/dL   RDW 13.2 11.5 - 15.5 %   Platelets 246 150 - 400 K/uL   nRBC 0.0 0.0 - 0.2 %   Neutrophils Relative % 87 %   Neutro Abs 15.0 (H) 1.7 - 7.7 K/uL   Lymphocytes Relative 6 %   Lymphs Abs 1.1 0.7 - 4.0 K/uL   Monocytes Relative 5 %   Monocytes Absolute 0.9 0.1 - 1.0 K/uL   Eosinophils Relative 2 %   Eosinophils Absolute 0.3 0.0 - 0.5 K/uL   Basophils Relative 0 %   Basophils Absolute 0.0 0.0 - 0.1 K/uL   Immature Granulocytes 0 %   Abs Immature Granulocytes 0.06 0.00 - 0.07 K/uL  Protime-INR     Status: Abnormal   Collection Time: 07/01/21  4:14 PM  Result Value Ref Range   Prothrombin Time 16.9 (H) 11.4 - 15.2 seconds   INR 1.4 (H) 0.8 - 1.2  Lipase, blood     Status: None   Collection Time: 07/01/21  4:26 PM  Result Value Ref Range   Lipase 25 11 - 51 U/L  Lactic acid, plasma     Status: Abnormal   Collection Time: 07/01/21  5:20 PM  Result Value Ref Range   Lactic Acid, Venous 3.0 (HH) 0.5 - 1.9 mmol/L  Ammonia     Status: None   Collection Time:  07/01/21  5:44 PM  Result Value Ref Range   Ammonia 26 9 - 35 umol/L  Lactic acid, plasma     Status: None   Collection Time: 07/01/21  6:42 PM  Result Value Ref Range   Lactic Acid, Venous 1.8 0.5 - 1.9 mmol/L  Urinalysis, Routine w reflex microscopic Urine, Clean Catch     Status: Abnormal   Collection Time: 07/01/21  7:41 PM  Result Value Ref Range   Color, Urine COLORLESS (A) YELLOW   APPearance CLEAR CLEAR   Specific Gravity, Urine 1.003 (L) 1.005 - 1.030   pH 6.0 5.0 - 8.0   Glucose, UA NEGATIVE NEGATIVE mg/dL   Hgb urine dipstick SMALL (A) NEGATIVE   Bilirubin Urine NEGATIVE NEGATIVE   Ketones, ur NEGATIVE NEGATIVE mg/dL   Protein, ur NEGATIVE NEGATIVE mg/dL   Nitrite NEGATIVE NEGATIVE   Leukocytes,Ua NEGATIVE NEGATIVE   RBC / HPF 0-5 0 - 5 RBC/hpf   Bacteria, UA RARE (A) NONE SEEN   Squamous Epithelial / LPF 0-5 0 - 5   Mucus PRESENT   Basic metabolic panel     Status: Abnormal   Collection Time: 07/02/21  2:52 AM  Result Value Ref Range   Sodium 134 (L) 135 - 145 mmol/L   Potassium 3.8 3.5 - 5.1 mmol/L   Chloride 104 98 - 111 mmol/L   CO2 22 22 - 32 mmol/L   Glucose, Bld 103 (H) 70 - 99 mg/dL   BUN 14 8 - 23 mg/dL   Creatinine, Ser 1.44 (H) 0.61 - 1.24 mg/dL   Calcium 8.4 (L) 8.9 - 10.3 mg/dL   GFR, Estimated 52 (L) >60 mL/min   Anion gap 8 5 - 15  Protime-INR     Status: Abnormal   Collection Time: 07/02/21  2:52 AM  Result Value Ref Range   Prothrombin Time 20.4 (H) 11.4 - 15.2 seconds   INR 1.7 (H) 0.8 - 1.2     DG Chest 2 View  Result Date: 06/30/2021 CLINICAL DATA:  Fever. EXAM: CHEST - 2 VIEW COMPARISON:  Radiograph 10/10/2006 FINDINGS: The cardiomediastinal  contours are normal. Minor right middle lobe atelectasis. Pulmonary vasculature is normal. No consolidation, pleural effusion, or pneumothorax. Thoracic spondylosis with endplate spurring. No acute osseous abnormalities are seen. IMPRESSION: Minor right middle lobe atelectasis.  No evidence of  pneumonia Electronically Signed   By: Keith Rake M.D.   On: 06/30/2021 21:08   CT Head Wo Contrast  Result Date: 06/30/2021 CLINICAL DATA:  Fever started on Friday, Seen at [facility] and cleared without anything abnormal. Covid test x 3 which have been negative, yesterday nausea, today lack of appetite and more sleepy. EXAM: CT HEAD WITHOUT CONTRAST TECHNIQUE: Contiguous axial images were obtained from the base of the skull through the vertex without intravenous contrast. COMPARISON:  None. FINDINGS: Brain: No evidence of acute infarction, hemorrhage, hydrocephalus, extra-axial collection or mass lesion/mass effect. Vascular: No hyperdense vessel or unexpected calcification. Skull: Normal. Negative for fracture or focal lesion. Sinuses/Orbits: Globes and orbits are unremarkable. Visualized sinuses are clear. Other: None. IMPRESSION: Normal unenhanced CT scan of the brain. Electronically Signed   By: Lajean Manes M.D.   On: 06/30/2021 21:55   MR Abdomen W or Wo Contrast  Result Date: 07/01/2021 CLINICAL DATA:  Liver lesions seen on CT scan. EXAM: MRI ABDOMEN WITHOUT AND WITH CONTRAST TECHNIQUE: Multiplanar multisequence MR imaging of the abdomen was performed both before and after the administration of intravenous contrast. CONTRAST:  51mL GADAVIST GADOBUTROL 1 MMOL/ML IV SOLN COMPARISON:  CT scan 06/30/2021 FINDINGS: Examination is quite limited due to respiratory motion. The patient could not hold his breath for this examination. Lower chest: The lung bases grossly clear. No worrisome pulmonary lesions. No pleural or pericardial effusion. Hepatobiliary: Innumerable lesions throughout the spleen appear to be diffusion positive. No intrahepatic biliary dilatation. Normal caliber and course of the common bile duct. The gallbladder is unremarkable. Pancreas: 8 mm cyst is noted in the head body junction region the pancreas as seen on the CT scan. No worrisome MR imaging features. No ductal dilatation. Spleen:   Normal size.  Small splenic cyst noted. Adrenals/Urinary Tract: The adrenal glands are unremarkable. Simple appearing renal cysts bilaterally. The largest cyst is on the right side and measures 10 cm. Stomach/Bowel: The stomach, duodenum, visualized small visualized colon are grossly. On the CT scan could not exclude the possibility of a partially circumferential rectal mass. Recommend correlation with rectal exam. Vascular/Lymphatic: No aortic aneurysm or dissection. The major venous structures are patent. No mesenteric or retroperitoneal mass or adenopathy. Other:  No ascites or abdominal wall hernia. Musculoskeletal: 2 no significant bony findings. IMPRESSION: 1. Examination is quite limited due to respiratory motion. 2. Innumerable lesions throughout the liver appear to be diffusion positive. Findings worrisome for metastatic disease. 3. 8 mm cyst in the head body junction region the pancreas. No worrisome MR imaging features. Recommend follow-up MRI abdomen without and with contrast in 1 year. 4. On the CT scan could not exclude the possibility of a partially circumferential rectal mass. Recommend correlation with rectal exam. 5. PET-CT may be helpful for further evaluation. Electronically Signed   By: Marijo Sanes M.D.   On: 07/01/2021 16:07   CT ABDOMEN PELVIS W CONTRAST  Result Date: 06/30/2021 CLINICAL DATA:  Abdominal pain, fever and unintended weight loss. Concern for intra-abdominal malignancy. EXAM: CT ABDOMEN AND PELVIS WITH CONTRAST TECHNIQUE: Multidetector CT imaging of the abdomen and pelvis was performed using the standard protocol following bolus administration of intravenous contrast. CONTRAST:  145mL OMNIPAQUE IOHEXOL 300 MG/ML  SOLN COMPARISON:  Abdominal ultrasound 09/25/2018.  Abdominal CT 01/22/2009 FINDINGS: Lower chest: Tiny platelet subpleural opacity in the right lower lobe which was faintly visualized on 2010 exam, considered benign. No basilar pulmonary nodule, effusion or focal  airspace disease. The heart is normal in size. Hepatobiliary: The liver parenchyma is diffusely heterogeneous with suggestion of innumerable low-density lesions. Largest suspected lesion in the left lobe measures 12 mm, series 2, image 15. nondistended gallbladder. No calcified gallstone or pericholecystic fat stranding. Pancreas: 9 mm cyst in the proximal pancreatic body, series 2, image 25. No ductal dilatation or inflammation. Spleen: Normal in size.  12 mm low-density in the inferior spleen. Adrenals/Urinary Tract: No adrenal nodule. No hydronephrosis. There are multiple bilateral cysts within both kidneys. This includes a dominant exophytic cyst arising from the lower right kidney that measures 11.1 cm. There is no internal complexity. No solid renal lesions. Urinary bladder is partially distended, no obvious bladder abnormality. Stomach/Bowel: Detailed bowel assessment is limited in the absence of enteric contrast as well as paucity of intra-abdominal fat. Stomach is decompressed further limiting assessment. There is no small bowel obstruction. No obvious small bowel inflammation the appendix is not discretely visualized moderate colonic stool burden. There is colonic redundancy. The splenic flexure of the colon is nondistended which limits assessment. Vascular/Lymphatic: Aortic atherosclerosis. No aortic aneurysm. Patent portal vein. No bulky abdominopelvic adenopathy, paucity of intra-abdominal fat limits assessment for adenopathy. Reproductive: Prominent prostate gland spanning 5.4 cm with nodular projection into the bladder base. Other: No ascites.  No free air.  No definite omental thickening. Musculoskeletal: Grade 1 anterolisthesis of L5 on S1. Unilateral left L5 pars defect. Slight heterogeneous appearance of the marrow but no discrete focal bone lesion. IMPRESSION: 1. Diffusely heterogeneous liver parenchyma with suggestion of innumerable low-density lesions. Recommend further characterization with  hepatic protocol MRI. This should only be performed if patient is able to tolerate breath hold technique. 2. Small cyst in the pancreas has benign CT features, 9 mm. Follow-up for a pancreatic cyst of this size in 5 years, if not further evaluated. 3. Multiple bilateral renal cysts. 4. Enlarged prostate gland with nodular projection into the bladder base. Recommend correlation with PSA. Aortic Atherosclerosis (ICD10-I70.0). Electronically Signed   By: Keith Rake M.D.   On: 06/30/2021 22:06   US Abdomen Limited RUQ (LIVER/GB)  Result Date: 06/30/2021 CLINICAL DATA:  Liver mass on CT. EXAM: ULTRASOUND ABDOMEN LIMITED RIGHT UPPER QUADRANT COMPARISON:  CT abdomen pelvis 06/30/2021 FINDINGS: Gallbladder: No gallstones or wall thickening visualized. No sonographic Murphy sign noted by sonographer. Common bile duct: Diameter: 4 mm. Liver: No focal hepatic lesion. Slightly heterogeneous parenchymal echogenicity. Portal vein is patent on color Doppler imaging with normal direction of blood flow towards the liver. Other: None. IMPRESSION: Slightly heterogeneous hepatic parenchyma with no sonographic finding of focal hepatic lesion. Recommend MRI liver protocol for further evaluation given CT abdomen pelvis 06/30/2021 findings. When the patient is clinically stable and able to follow directions and hold their breath (preferably as an outpatient) further evaluation with dedicated abdominal MRI should be considered. Electronically Signed   By: Iven Finn M.D.   On: 06/30/2021 23:41    ROS:  As stated above in the HPI otherwise negative.  Blood pressure 117/74, pulse 77, temperature 98.6 F (37 C), temperature source Oral, resp. rate 17, SpO2 97 %.    PE: Gen: Tired from the evening Lungs: CTA Bilaterally CV: RRR without M/G/R ABD: Soft, NTND, +BS Ext: No C/C/E Rectal:  No masses palpated, no gross blood, green stool.  Assessment/Plan: 1) ? Rectal mass. 2) Metastatic liver disease. 3) Fever.   A  rectal examination was performed and there was no palpation of any masses or abnormalities.  No blood was grossly identified.  Given the degree of hepatic mets, one expects a palpable abnormality in the rectum, if this is the source.  This may be a primary of unknown origin.  Even though nothing was palpated, it is good practice to perform a FFS to ensure that there is no rectal malignancy.  As for his fever, with the report of it being cyclical, it may be that he is having fever from his mets to his liver.  Plan: 1) FFs tomorrow. 2) If the FFS is negative, then a liver biopsy of the lesions will be required.  Chandelle Harkey D 07/02/2021, 1:07 PM

## 2021-07-02 NOTE — Progress Notes (Signed)
[facility] night cross cover note:  Patient here with sepsis of unclear source, on broad spectrum IV abx.  I ordered prn acetaminophen for fever. Repeat blood cx's ordered.     Babs Bertin, DO Hospitalist

## 2021-07-02 NOTE — ED Notes (Signed)
Pt ambulated to the bathroom with the assistance of this RN and his wife. Pt experienced no issues. Will continue to monitor.

## 2021-07-02 NOTE — H&P (View-Only) (Signed)
Reason for Consult: ? Rectal mass Referring Physician: [facility]  Desiree Fleming HPI: This is a 70 year old male with a PMH of HTN admitted for fever for the past week and a 25 lbs weight loss over the past several months.  He presented to the ER and a work up was performed with a CT scan.  The scan suggested innumerable lesions in the liver and an MRI was recommended.  He was sent home, but his wife was concerned about waiting for an outpatient MRI.  The MRI was performed and it shows metastatic disease in the liver and rectal wall thickening.  At home he was experiencing temperatures at 102-103 F.  The fever seemed to be cyclical in the AM and the PM.  He had two relatively recent colonoscopies with Dr. Collene Mares on 06/06/2013 and 07/17/2018.  The index colonoscopy showed a small descending colon tubular adenoma and the follow up colonoscopy was essentially normal.  There was no recent report of any hematochezia or diarrhea.  Past Medical History:  Diagnosis Date   Allergic rhinitis due to pollen    Essential hypertension, malignant    Nonspecific abnormal electrocardiogram (ECG) (EKG)    Pure hypercholesterolemia     No past surgical history on file.  Family History  Problem Relation Age of Onset   CAD Mother    CAD Father     Social History:  reports that he has never smoked. He has never used smokeless tobacco. He reports that he does not drink alcohol and does not use drugs.  Allergies:  Allergies  Allergen Reactions   Tomato Other (See Comments)    Unknown - whole tomato    Medications: Scheduled:  doxazosin  4 mg Oral Daily   enoxaparin (LOVENOX) injection  40 mg Subcutaneous Q24H   rosuvastatin  5 mg Oral Daily   Continuous:  sodium chloride 125 mL/hr at 07/02/21 0505   piperacillin-tazobactam (ZOSYN)  IV 3.375 g (07/02/21 0928)   vancomycin      Results for orders placed or performed during the hospital encounter of 07/01/21 (from the past 24 hour(s))   Comprehensive metabolic panel     Status: Abnormal   Collection Time: 07/01/21  4:14 PM  Result Value Ref Range   Sodium 130 (L) 135 - 145 mmol/L   Potassium 4.2 3.5 - 5.1 mmol/L   Chloride 96 (L) 98 - 111 mmol/L   CO2 26 22 - 32 mmol/L   Glucose, Bld 107 (H) 70 - 99 mg/dL   BUN 15 8 - 23 mg/dL   Creatinine, Ser 1.43 (H) 0.61 - 1.24 mg/dL   Calcium 8.6 (L) 8.9 - 10.3 mg/dL   Total Protein 6.7 6.5 - 8.1 g/dL   Albumin 3.2 (L) 3.5 - 5.0 g/dL   AST 27 15 - 41 U/L   ALT 38 0 - 44 U/L   Alkaline Phosphatase 109 38 - 126 U/L   Total Bilirubin 0.9 0.3 - 1.2 mg/dL   GFR, Estimated 52 (L) >60 mL/min   Anion gap 8 5 - 15  CBC with Differential     Status: Abnormal   Collection Time: 07/01/21  4:14 PM  Result Value Ref Range   WBC 17.3 (H) 4.0 - 10.5 K/uL   RBC 4.45 4.22 - 5.81 MIL/uL   Hemoglobin 13.2 13.0 - 17.0 g/dL   HCT 39.8 39.0 - 52.0 %   MCV 89.4 80.0 - 100.0 fL   MCH 29.7 26.0 - 34.0 pg  MCHC 33.2 30.0 - 36.0 g/dL   RDW 13.2 11.5 - 15.5 %   Platelets 246 150 - 400 K/uL   nRBC 0.0 0.0 - 0.2 %   Neutrophils Relative % 87 %   Neutro Abs 15.0 (H) 1.7 - 7.7 K/uL   Lymphocytes Relative 6 %   Lymphs Abs 1.1 0.7 - 4.0 K/uL   Monocytes Relative 5 %   Monocytes Absolute 0.9 0.1 - 1.0 K/uL   Eosinophils Relative 2 %   Eosinophils Absolute 0.3 0.0 - 0.5 K/uL   Basophils Relative 0 %   Basophils Absolute 0.0 0.0 - 0.1 K/uL   Immature Granulocytes 0 %   Abs Immature Granulocytes 0.06 0.00 - 0.07 K/uL  Protime-INR     Status: Abnormal   Collection Time: 07/01/21  4:14 PM  Result Value Ref Range   Prothrombin Time 16.9 (H) 11.4 - 15.2 seconds   INR 1.4 (H) 0.8 - 1.2  Lipase, blood     Status: None   Collection Time: 07/01/21  4:26 PM  Result Value Ref Range   Lipase 25 11 - 51 U/L  Lactic acid, plasma     Status: Abnormal   Collection Time: 07/01/21  5:20 PM  Result Value Ref Range   Lactic Acid, Venous 3.0 (HH) 0.5 - 1.9 mmol/L  Ammonia     Status: None   Collection Time:  07/01/21  5:44 PM  Result Value Ref Range   Ammonia 26 9 - 35 umol/L  Lactic acid, plasma     Status: None   Collection Time: 07/01/21  6:42 PM  Result Value Ref Range   Lactic Acid, Venous 1.8 0.5 - 1.9 mmol/L  Urinalysis, Routine w reflex microscopic Urine, Clean Catch     Status: Abnormal   Collection Time: 07/01/21  7:41 PM  Result Value Ref Range   Color, Urine COLORLESS (A) YELLOW   APPearance CLEAR CLEAR   Specific Gravity, Urine 1.003 (L) 1.005 - 1.030   pH 6.0 5.0 - 8.0   Glucose, UA NEGATIVE NEGATIVE mg/dL   Hgb urine dipstick SMALL (A) NEGATIVE   Bilirubin Urine NEGATIVE NEGATIVE   Ketones, ur NEGATIVE NEGATIVE mg/dL   Protein, ur NEGATIVE NEGATIVE mg/dL   Nitrite NEGATIVE NEGATIVE   Leukocytes,Ua NEGATIVE NEGATIVE   RBC / HPF 0-5 0 - 5 RBC/hpf   Bacteria, UA RARE (A) NONE SEEN   Squamous Epithelial / LPF 0-5 0 - 5   Mucus PRESENT   Basic metabolic panel     Status: Abnormal   Collection Time: 07/02/21  2:52 AM  Result Value Ref Range   Sodium 134 (L) 135 - 145 mmol/L   Potassium 3.8 3.5 - 5.1 mmol/L   Chloride 104 98 - 111 mmol/L   CO2 22 22 - 32 mmol/L   Glucose, Bld 103 (H) 70 - 99 mg/dL   BUN 14 8 - 23 mg/dL   Creatinine, Ser 1.44 (H) 0.61 - 1.24 mg/dL   Calcium 8.4 (L) 8.9 - 10.3 mg/dL   GFR, Estimated 52 (L) >60 mL/min   Anion gap 8 5 - 15  Protime-INR     Status: Abnormal   Collection Time: 07/02/21  2:52 AM  Result Value Ref Range   Prothrombin Time 20.4 (H) 11.4 - 15.2 seconds   INR 1.7 (H) 0.8 - 1.2     DG Chest 2 View  Result Date: 06/30/2021 CLINICAL DATA:  Fever. EXAM: CHEST - 2 VIEW COMPARISON:  Radiograph 10/10/2006 FINDINGS: The cardiomediastinal  contours are normal. Minor right middle lobe atelectasis. Pulmonary vasculature is normal. No consolidation, pleural effusion, or pneumothorax. Thoracic spondylosis with endplate spurring. No acute osseous abnormalities are seen. IMPRESSION: Minor right middle lobe atelectasis.  No evidence of  pneumonia Electronically Signed   By: Keith Rake M.D.   On: 06/30/2021 21:08   CT Head Wo Contrast  Result Date: 06/30/2021 CLINICAL DATA:  Fever started on Friday, Seen at [facility] and cleared without anything abnormal. Covid test x 3 which have been negative, yesterday nausea, today lack of appetite and more sleepy. EXAM: CT HEAD WITHOUT CONTRAST TECHNIQUE: Contiguous axial images were obtained from the base of the skull through the vertex without intravenous contrast. COMPARISON:  None. FINDINGS: Brain: No evidence of acute infarction, hemorrhage, hydrocephalus, extra-axial collection or mass lesion/mass effect. Vascular: No hyperdense vessel or unexpected calcification. Skull: Normal. Negative for fracture or focal lesion. Sinuses/Orbits: Globes and orbits are unremarkable. Visualized sinuses are clear. Other: None. IMPRESSION: Normal unenhanced CT scan of the brain. Electronically Signed   By: Lajean Manes M.D.   On: 06/30/2021 21:55   MR Abdomen W or Wo Contrast  Result Date: 07/01/2021 CLINICAL DATA:  Liver lesions seen on CT scan. EXAM: MRI ABDOMEN WITHOUT AND WITH CONTRAST TECHNIQUE: Multiplanar multisequence MR imaging of the abdomen was performed both before and after the administration of intravenous contrast. CONTRAST:  2mL GADAVIST GADOBUTROL 1 MMOL/ML IV SOLN COMPARISON:  CT scan 06/30/2021 FINDINGS: Examination is quite limited due to respiratory motion. The patient could not hold his breath for this examination. Lower chest: The lung bases grossly clear. No worrisome pulmonary lesions. No pleural or pericardial effusion. Hepatobiliary: Innumerable lesions throughout the spleen appear to be diffusion positive. No intrahepatic biliary dilatation. Normal caliber and course of the common bile duct. The gallbladder is unremarkable. Pancreas: 8 mm cyst is noted in the head body junction region the pancreas as seen on the CT scan. No worrisome MR imaging features. No ductal dilatation. Spleen:   Normal size.  Small splenic cyst noted. Adrenals/Urinary Tract: The adrenal glands are unremarkable. Simple appearing renal cysts bilaterally. The largest cyst is on the right side and measures 10 cm. Stomach/Bowel: The stomach, duodenum, visualized small visualized colon are grossly. On the CT scan could not exclude the possibility of a partially circumferential rectal mass. Recommend correlation with rectal exam. Vascular/Lymphatic: No aortic aneurysm or dissection. The major venous structures are patent. No mesenteric or retroperitoneal mass or adenopathy. Other:  No ascites or abdominal wall hernia. Musculoskeletal: 2 no significant bony findings. IMPRESSION: 1. Examination is quite limited due to respiratory motion. 2. Innumerable lesions throughout the liver appear to be diffusion positive. Findings worrisome for metastatic disease. 3. 8 mm cyst in the head body junction region the pancreas. No worrisome MR imaging features. Recommend follow-up MRI abdomen without and with contrast in 1 year. 4. On the CT scan could not exclude the possibility of a partially circumferential rectal mass. Recommend correlation with rectal exam. 5. PET-CT may be helpful for further evaluation. Electronically Signed   By: Marijo Sanes M.D.   On: 07/01/2021 16:07   CT ABDOMEN PELVIS W CONTRAST  Result Date: 06/30/2021 CLINICAL DATA:  Abdominal pain, fever and unintended weight loss. Concern for intra-abdominal malignancy. EXAM: CT ABDOMEN AND PELVIS WITH CONTRAST TECHNIQUE: Multidetector CT imaging of the abdomen and pelvis was performed using the standard protocol following bolus administration of intravenous contrast. CONTRAST:  192mL OMNIPAQUE IOHEXOL 300 MG/ML  SOLN COMPARISON:  Abdominal ultrasound 09/25/2018.  Abdominal CT 01/22/2009 FINDINGS: Lower chest: Tiny platelet subpleural opacity in the right lower lobe which was faintly visualized on 2010 exam, considered benign. No basilar pulmonary nodule, effusion or focal  airspace disease. The heart is normal in size. Hepatobiliary: The liver parenchyma is diffusely heterogeneous with suggestion of innumerable low-density lesions. Largest suspected lesion in the left lobe measures 12 mm, series 2, image 15. nondistended gallbladder. No calcified gallstone or pericholecystic fat stranding. Pancreas: 9 mm cyst in the proximal pancreatic body, series 2, image 25. No ductal dilatation or inflammation. Spleen: Normal in size.  12 mm low-density in the inferior spleen. Adrenals/Urinary Tract: No adrenal nodule. No hydronephrosis. There are multiple bilateral cysts within both kidneys. This includes a dominant exophytic cyst arising from the lower right kidney that measures 11.1 cm. There is no internal complexity. No solid renal lesions. Urinary bladder is partially distended, no obvious bladder abnormality. Stomach/Bowel: Detailed bowel assessment is limited in the absence of enteric contrast as well as paucity of intra-abdominal fat. Stomach is decompressed further limiting assessment. There is no small bowel obstruction. No obvious small bowel inflammation the appendix is not discretely visualized moderate colonic stool burden. There is colonic redundancy. The splenic flexure of the colon is nondistended which limits assessment. Vascular/Lymphatic: Aortic atherosclerosis. No aortic aneurysm. Patent portal vein. No bulky abdominopelvic adenopathy, paucity of intra-abdominal fat limits assessment for adenopathy. Reproductive: Prominent prostate gland spanning 5.4 cm with nodular projection into the bladder base. Other: No ascites.  No free air.  No definite omental thickening. Musculoskeletal: Grade 1 anterolisthesis of L5 on S1. Unilateral left L5 pars defect. Slight heterogeneous appearance of the marrow but no discrete focal bone lesion. IMPRESSION: 1. Diffusely heterogeneous liver parenchyma with suggestion of innumerable low-density lesions. Recommend further characterization with  hepatic protocol MRI. This should only be performed if patient is able to tolerate breath hold technique. 2. Small cyst in the pancreas has benign CT features, 9 mm. Follow-up for a pancreatic cyst of this size in 5 years, if not further evaluated. 3. Multiple bilateral renal cysts. 4. Enlarged prostate gland with nodular projection into the bladder base. Recommend correlation with PSA. Aortic Atherosclerosis (ICD10-I70.0). Electronically Signed   By: Keith Rake M.D.   On: 06/30/2021 22:06   US Abdomen Limited RUQ (LIVER/GB)  Result Date: 06/30/2021 CLINICAL DATA:  Liver mass on CT. EXAM: ULTRASOUND ABDOMEN LIMITED RIGHT UPPER QUADRANT COMPARISON:  CT abdomen pelvis 06/30/2021 FINDINGS: Gallbladder: No gallstones or wall thickening visualized. No sonographic Murphy sign noted by sonographer. Common bile duct: Diameter: 4 mm. Liver: No focal hepatic lesion. Slightly heterogeneous parenchymal echogenicity. Portal vein is patent on color Doppler imaging with normal direction of blood flow towards the liver. Other: None. IMPRESSION: Slightly heterogeneous hepatic parenchyma with no sonographic finding of focal hepatic lesion. Recommend MRI liver protocol for further evaluation given CT abdomen pelvis 06/30/2021 findings. When the patient is clinically stable and able to follow directions and hold their breath (preferably as an outpatient) further evaluation with dedicated abdominal MRI should be considered. Electronically Signed   By: Iven Finn M.D.   On: 06/30/2021 23:41    ROS:  As stated above in the HPI otherwise negative.  Blood pressure 117/74, pulse 77, temperature 98.6 F (37 C), temperature source Oral, resp. rate 17, SpO2 97 %.    PE: Gen: Tired from the evening Lungs: CTA Bilaterally CV: RRR without M/G/R ABD: Soft, NTND, +BS Ext: No C/C/E Rectal:  No masses palpated, no gross blood, green stool.  Assessment/Plan: 1) ? Rectal mass. 2) Metastatic liver disease. 3) Fever.   A  rectal examination was performed and there was no palpation of any masses or abnormalities.  No blood was grossly identified.  Given the degree of hepatic mets, one expects a palpable abnormality in the rectum, if this is the source.  This may be a primary of unknown origin.  Even though nothing was palpated, it is good practice to perform a FFS to ensure that there is no rectal malignancy.  As for his fever, with the report of it being cyclical, it may be that he is having fever from his mets to his liver.  Plan: 1) FFs tomorrow. 2) If the FFS is negative, then a liver biopsy of the lesions will be required.  Remiel Corti D 07/02/2021, 1:07 PM

## 2021-07-03 ENCOUNTER — Inpatient Hospital Stay (HOSPITAL_COMMUNITY): Payer: Medicare Other | Admitting: Anesthesiology

## 2021-07-03 ENCOUNTER — Encounter (HOSPITAL_COMMUNITY): Payer: Self-pay | Admitting: Internal Medicine

## 2021-07-03 ENCOUNTER — Encounter (HOSPITAL_COMMUNITY)
Admission: EM | Disposition: A | Discharge status: Home Health | Payer: Self-pay | Source: Home / Self Care | Attending: Internal Medicine

## 2021-07-03 DIAGNOSIS — R6520 Severe sepsis without septic shock: Secondary | ICD-10-CM | POA: Diagnosis not present

## 2021-07-03 DIAGNOSIS — A419 Sepsis, unspecified organism: Secondary | ICD-10-CM | POA: Diagnosis not present

## 2021-07-03 HISTORY — PX: FLEXIBLE SIGMOIDOSCOPY: SHX5431

## 2021-07-03 LAB — COMPREHENSIVE METABOLIC PANEL
ALT: 28 U/L (ref 0–44)
AST: 16 U/L (ref 15–41)
Albumin: 2.6 g/dL — ABNORMAL LOW (ref 3.5–5.0)
Alkaline Phosphatase: 84 U/L (ref 38–126)
Anion gap: 6 (ref 5–15)
BUN: 11 mg/dL (ref 8–23)
CO2: 26 mmol/L (ref 22–32)
Calcium: 8.3 mg/dL — ABNORMAL LOW (ref 8.9–10.3)
Chloride: 105 mmol/L (ref 98–111)
Creatinine, Ser: 1.32 mg/dL — ABNORMAL HIGH (ref 0.61–1.24)
GFR, Estimated: 58 mL/min — ABNORMAL LOW (ref >60)
Glucose, Bld: 97 mg/dL (ref 70–99)
Potassium: 3.2 mmol/L — ABNORMAL LOW (ref 3.5–5.1)
Sodium: 137 mmol/L (ref 135–145)
Total Bilirubin: 0.8 mg/dL (ref 0.3–1.2)
Total Protein: 6 g/dL — ABNORMAL LOW (ref 6.5–8.1)

## 2021-07-03 LAB — CBC
HCT: 36.2 % — ABNORMAL LOW (ref 39.0–52.0)
Hemoglobin: 11.9 g/dL — ABNORMAL LOW (ref 13.0–17.0)
MCH: 29.5 pg (ref 26.0–34.0)
MCHC: 32.9 g/dL (ref 30.0–36.0)
MCV: 89.8 fL (ref 80.0–100.0)
Platelets: 246 10*3/uL (ref 150–400)
RBC: 4.03 MIL/uL — ABNORMAL LOW (ref 4.22–5.81)
RDW: 13.1 % (ref 11.5–15.5)
WBC: 17.1 10*3/uL — ABNORMAL HIGH (ref 4.0–10.5)
nRBC: 0 % (ref 0.0–0.2)

## 2021-07-03 LAB — PROCALCITONIN: Procalcitonin: 0.41 ng/mL

## 2021-07-03 LAB — AFP TUMOR MARKER: AFP, Serum, Tumor Marker: 1.4 ng/mL (ref 0.0–8.4)

## 2021-07-03 LAB — MAGNESIUM: Magnesium: 2.2 mg/dL (ref 1.7–2.4)

## 2021-07-03 LAB — PHOSPHORUS: Phosphorus: 3 mg/dL (ref 2.5–4.6)

## 2021-07-03 SURGERY — SIGMOIDOSCOPY, FLEXIBLE
Anesthesia: Monitor Anesthesia Care

## 2021-07-03 MED ORDER — LORAZEPAM 2 MG/ML IJ SOLN
0.5000 mg | INTRAMUSCULAR | Status: AC
  Filled 2021-07-03 (×2): qty 1

## 2021-07-03 MED ORDER — SODIUM CHLORIDE 0.9 % IV SOLN
12.5000 mg | Freq: Once | INTRAVENOUS | Status: AC
  Administered 2021-07-03: 12.5 mg via INTRAVENOUS
  Filled 2021-07-03: qty 12.5
  Filled 2021-07-03: qty 0.5

## 2021-07-03 MED ORDER — PROPOFOL 10 MG/ML IV BOLUS
INTRAVENOUS | Status: DC | PRN
  Administered 2021-07-03: 30 mg via INTRAVENOUS

## 2021-07-03 MED ORDER — ONDANSETRON HCL 4 MG/2ML IJ SOLN
4.0000 mg | Freq: Four times a day (QID) | INTRAMUSCULAR | Status: DC | PRN
  Administered 2021-07-03: 4 mg via INTRAVENOUS
  Filled 2021-07-03: qty 2

## 2021-07-03 MED ORDER — POTASSIUM CHLORIDE 10 MEQ/100ML IV SOLN
10.0000 meq | INTRAVENOUS | Status: AC
  Administered 2021-07-03 (×4): 10 meq via INTRAVENOUS
  Filled 2021-07-03 (×4): qty 100

## 2021-07-03 MED ORDER — PROPOFOL 500 MG/50ML IV EMUL
INTRAVENOUS | Status: DC | PRN
  Administered 2021-07-03: 75 ug/kg/min via INTRAVENOUS

## 2021-07-03 MED ORDER — POTASSIUM CHLORIDE CRYS ER 20 MEQ PO TBCR
40.0000 meq | EXTENDED_RELEASE_TABLET | Freq: Two times a day (BID) | ORAL | Status: DC
  Administered 2021-07-03: 40 meq via ORAL
  Filled 2021-07-03: qty 2

## 2021-07-03 MED ORDER — SODIUM CHLORIDE 0.9 % IV SOLN: INTRAVENOUS | Status: DC | PRN

## 2021-07-03 NOTE — Progress Notes (Signed)
PT Cancellation Note  Patient Details Name: Shane Lam MRN: [id number] DOB: 06-16-1951   Cancelled Treatment:    Reason Eval/Treat Not Completed: Medical issues which prohibited therapy. Pt vomiting upon PT arrival. PT will follow up as time allows.   Zenaida Niece 07/03/2021, 4:18 PM

## 2021-07-03 NOTE — Anesthesia Preprocedure Evaluation (Addendum)
Anesthesia Evaluation  Patient identified by MRN, date of birth, ID band Patient awake    Reviewed: Allergy & Precautions, NPO status , Patient's Chart, lab work & pertinent test results  Airway Mallampati: I  TM Distance: >3 FB Neck ROM: Full    Dental no notable dental hx. (+) Teeth Intact, Dental Advisory Given   Pulmonary neg pulmonary ROS,    Pulmonary exam normal breath sounds clear to auscultation       Cardiovascular hypertension, Pt. on medications Normal cardiovascular exam Rhythm:Regular Rate:Normal  TTE 2022 . Left ventricular ejection fraction, by estimation, is 55 to 60%. The  left ventricle has normal function. The left ventricle has no regional  wall motion abnormalities. Left ventricular diastolic parameters are  consistent with Grade I diastolic  dysfunction (impaired relaxation).  2. Right ventricular systolic function is normal. The right ventricular  size is normal. There is normal pulmonary artery systolic pressure. The  estimated right ventricular systolic pressure is 76.8 mmHg.  3. Left atrial size was mildly dilated.  4. The mitral valve is normal in structure. Trivial mitral valve  regurgitation. No evidence of mitral stenosis.  5. The aortic valve is tricuspid. Aortic valve regurgitation is not  visualized. Mild aortic valve sclerosis is present, with no evidence of  aortic valve stenosis.  6. The inferior vena cava is normal in size with greater than 50%  respiratory variability, suggesting right atrial pressure of 3 mmHg.  7. No definite endocarditis visualized. The aortic valve is calcified but  there is not a definite vegetation. If high suspicion, needs TEE.    Neuro/Psych negative neurological ROS  negative psych ROS   GI/Hepatic negative GI ROS, Neg liver ROS,   Endo/Other  negative endocrine ROS  Renal/GU negative Renal ROS  negative genitourinary   Musculoskeletal negative  musculoskeletal ROS (+)   Abdominal   Peds  Hematology negative hematology ROS (+)   Anesthesia Other Findings   Reproductive/Obstetrics                            Anesthesia Physical Anesthesia Plan  ASA: 2  Anesthesia Plan: MAC   Post-op Pain Management:    Induction: Intravenous  PONV Risk Score and Plan: Propofol infusion and Treatment may vary due to age or medical condition  Airway Management Planned: Natural Airway  Additional Equipment:   Intra-op Plan:   Post-operative Plan:   Informed Consent: I have reviewed the patients History and Physical, chart, labs and discussed the procedure including the risks, benefits and alternatives for the proposed anesthesia with the patient or authorized representative who has indicated his/her understanding and acceptance.     Dental advisory given  Plan Discussed with: CRNA  Anesthesia Plan Comments:         Anesthesia Quick Evaluation

## 2021-07-03 NOTE — Transfer of Care (Signed)
Immediate Anesthesia Transfer of Care Note  Patient: Shane Lam  Procedure(s) Performed: FLEXIBLE SIGMOIDOSCOPY  Patient Location: PACU  Anesthesia Type:MAC  Level of Consciousness: awake and drowsy  Airway & Oxygen Therapy: Patient Spontanous Breathing  Post-op Assessment: Report given to RN and Post -op Vital signs reviewed and stable  Post vital signs: Reviewed and stable  Last Vitals:  Vitals Value Taken Time  BP 121/73 07/03/21 1346  Temp    Pulse 41 07/03/21 1349  Resp 18 07/03/21 1349  SpO2 100 % 07/03/21 1349  Vitals shown include unvalidated device data.  Last Pain:  Vitals:   07/03/21 1300  TempSrc: Temporal  PainSc: 0-No pain         Complications: No notable events documented.

## 2021-07-03 NOTE — Anesthesia Postprocedure Evaluation (Signed)
Anesthesia Post Note  Patient: Shane Lam  Procedure(s) Performed: Ida     Patient location during evaluation: PACU Anesthesia Type: MAC Level of consciousness: awake and alert Pain management: pain level controlled Vital Signs Assessment: post-procedure vital signs reviewed and stable Respiratory status: spontaneous breathing, nonlabored ventilation, respiratory function stable and patient connected to nasal cannula oxygen Cardiovascular status: stable and blood pressure returned to baseline Postop Assessment: no apparent nausea or vomiting Anesthetic complications: no   No notable events documented.  Last Vitals:  Vitals:   07/03/21 1400 07/03/21 1430  BP: (!) 145/88 (!) 161/85  Pulse: (!) 40 (!) 45  Resp: (!) 9 16  Temp: 36.9 C 36.9 C  SpO2: 98% 100%    Last Pain:  Vitals:   07/03/21 1430  TempSrc:   PainSc: Asleep                 [name]

## 2021-07-03 NOTE — Interval H&P Note (Signed)
History and Physical Interval Note:  07/03/2021 1:20 PM  Shane Lam  has presented today for surgery, with the diagnosis of Abnormal MRI.  The various methods of treatment have been discussed with the patient and family. After consideration of risks, benefits and other options for treatment, the patient has consented to  Procedure(s): FLEXIBLE SIGMOIDOSCOPY (N/A) as a surgical intervention.  The patient's history has been reviewed, patient examined, no change in status, stable for surgery.  I have reviewed the patient's chart and labs.  Questions were answered to the patient's satisfaction.     Darrin Apodaca D

## 2021-07-03 NOTE — Op Note (Signed)
[facility] Patient Name: Shane Lam Procedure Date : 07/03/2021 MRN: [id number] Attending MD: Carol Ada , MD Date of Birth: [birth year] CSN: [id number] Age: 71 Admit Type: Outpatient Procedure:                Flexible Sigmoidoscopy Indications:              Abnormal MRI of the GI tract Providers:                Carol Ada, MD, Jaci Carrel, RN, Jeanella Cara, RN, Lodema Hong Technician,                            Technician Referring MD:              Medicines:                Propofol per Anesthesia Complications:            No immediate complications. Estimated Blood Loss:     Estimated blood loss: none. Procedure:                Pre-Anesthesia Assessment:                           - Prior to the procedure, a History and Physical                            was performed, and patient medications and                            allergies were reviewed. The patient's tolerance of                            previous anesthesia was also reviewed. The risks                            and benefits of the procedure and the sedation                            options and risks were discussed with the patient.                            All questions were answered, and informed consent                            was obtained. Prior Anticoagulants: The patient has                            taken no previous anticoagulant or antiplatelet                            agents. ASA Grade Assessment: III - A patient with  severe systemic disease. After reviewing the risks                            and benefits, the patient was deemed in                            satisfactory condition to undergo the procedure.                           - Sedation was administered by an anesthesia                            professional. Deep sedation was attained.                           After obtaining informed consent, the  scope was                            passed under direct vision. The [id number]-[id number] ([id number])                            Olympus endoscope was introduced through the anus                            and advanced to the the sigmoid colon. The flexible                            sigmoidoscopy was accomplished without difficulty.                            The patient tolerated the procedure well. The                            quality of the bowel preparation was adequate. Scope In: Scope Out: Findings:      The entire examined colon appeared normal. There was no evidence of any       masses, ulcerations, erosions, or vascular abnormalities. It is       concluded that the MRI reading was an over call. Impression:               - The entire examined colon is normal.                           - No specimens collected. Recommendation:           - Return patient to hospital ward for ongoing care.                           - Further evaluation per the primary service.                           - Signing off. Procedure Code(s):        --- Professional ---  68088, Sigmoidoscopy, flexible; diagnostic,                            including collection of specimen(s) by brushing or                            washing, when performed (separate procedure) Diagnosis Code(s):        --- Professional ---                           R93.3, Abnormal findings on diagnostic imaging of                            other parts of digestive tract CPT copyright 2019 [employer]. All rights reserved. The codes documented in this report are preliminary and upon coder review may  be revised to meet current compliance requirements. Carol Ada, MD Carol Ada, MD 07/03/2021 1:47:59 PM This report has been signed electronically. Number of Addenda: 0

## 2021-07-03 NOTE — Anesthesia Procedure Notes (Signed)
Procedure Name: MAC Date/Time: 07/03/2021 1:30 PM Performed by: Trinna Post., CRNA Pre-anesthesia Checklist: Patient identified, Emergency Drugs available, Suction available, Patient being monitored and Timeout performed Patient Re-evaluated:Patient Re-evaluated prior to induction Oxygen Delivery Method: Nasal cannula Preoxygenation: Pre-oxygenation with 100% oxygen Induction Type: IV induction Placement Confirmation: positive ETCO2

## 2021-07-03 NOTE — Progress Notes (Addendum)
PROGRESS NOTE  Shane Lam JWJ:[id number] DOB: 12-06-1950 DOA: 07/01/2021 PCP: [name], MD  HPI/Recap of past 24 hours: Shane Lam is a 70 y.o. male with medical history significant of HTN, BPH, HLD came with new onset of fever and generalized weakness and unintentional weight loss more than 25 pounds in the past several months.  Work-up revealed innumerable lesions in the liver seen on CT scan, an MRI was suggested which revealed findings worrisome for metastatic disease.  8 mm cyst in the head body junction region of the pancreas recommendation for follow-up MRI abdomen without and with contrast in 1 year.  On the CT scan could not exclude the possibility of a partially circumferential rectal mass.  Recommended correlation with rectal exam.  PET/CT may be helpful for further evaluation.  GI consulted.  07/03/2021: Seen and examined at his bedside.  He is minimally interactive.  He has no new complaints.  Planned flexible sigmoidoscopy completed by Dr. [name], no acute findings.  Assessment/Plan: Active Problems:   Sepsis ([place])  SIRS, no clear source of infection. WBC 17.3, T-max 102.4, respiration rate 30. No evidence of pneumonia on chest x-ray. No UTI on urinalysis. Blood cultures negative to date. He is on IV Zosyn and IV vancomycin empirically DC IV vancomycin for now. MRSA screen negative. Procalcitonin 0.41. Continue Zosyn for now  Innumerable lesions in the liver seen on CT scan/MRI abdomen, worrisome for metastatic disease. GI consulted, appreciate assistance. LFTs are normal  Concern for possible partially circumferential rectal mass Planned flexible sigmoidoscopy completed on 07/03/2021 by Dr. [name], no acute findings.  Acute blood loss anemia, unclear etiology Hemoglobin dropped 11.9 from 13.2. No overt bleeding Continue to closely monitor.  Resolved mild hypovolemic hyponatremia Serum sodium 134 Continue IV fluid hydration normal saline at 75 cc/h  CKD  3B Appears to be at his baseline creatinine 1.4 with GFR 52 Avoid nephrotoxic agent, dehydration and hypotension Continue IV fluid hydration Monitor urine output Repeat BMP in the morning  Hyperlipidemia LFTs are within normal limits. Holding off home Crestor 5 mg daily due to liver lesions.   Code Status: Full code  Family Communication: Wife at bedside  Disposition Plan: Likely will discharge to home once GI signs off.   Consultants: GI  Procedures: None  Antimicrobials: IV vancomycin, DC'd on 07/02/2021. IV Zosyn  DVT prophylaxis: Subcu Lovenox daily  Status is: Inpatient  Patient will require at least 2 midnights for further evaluation and treatment of present condition.      Objective: Vitals:   07/03/21 1345 07/03/21 1400 07/03/21 1430 07/03/21 1551  BP: 121/73 (!) 145/88 (!) 161/85 (!) 153/80  Pulse:  (!) 40 (!) 45 (!) 41  Resp: (!) 21 (!) 9 16 18   Temp:  98.4 F (36.9 C) 98.4 F (36.9 C) 98.2 F (36.8 C)  TempSrc:    Oral  SpO2: 100% 98% 100% 100%    Intake/Output Summary (Last 24 hours) at 07/03/2021 1717 Last data filed at 07/03/2021 1657 Gross per 24 hour  Intake 2388.04 ml  Output 150 ml  Net 2238.04 ml   There were no vitals filed for this visit.  Exam:  General: 70 y.o. year-old male frail-appearing no acute distress.  He is alert and oriented x3.   Cardiovascular: Regular rate and rhythm no rubs or gallops.  Respiratory: Clear to auscultation no wheezes or rales.  Abdomen: Soft nontender normal bowel sounds present.  Musculoskeletal: No lower extremity edema bilaterally.   Skin: No ulcerative lesions noted.  Psychiatry: Mood is appropriate for condition and setting.  Data Reviewed: CBC: Recent Labs  Lab 06/30/21 1844 07/01/21 1614 07/03/21 0918  WBC 12.1* 17.3* 17.1*  NEUTROABS  --  15.0*  --   HGB 13.1 13.2 11.9*  HCT 40.2 39.8 36.2*  MCV 88.9 89.4 89.8  PLT 223 246 193   Basic Metabolic Panel: Recent Labs  Lab  06/30/21 1844 07/01/21 1614 07/02/21 0252 07/03/21 0918  NA 131* 130* 134* 137  K 4.0 4.2 3.8 3.2*  CL 98 96* 104 105  CO2 25 26 22 26   GLUCOSE 113* 107* 103* 97  BUN 20 15 14 11   CREATININE 1.37* 1.43* 1.44* 1.32*  CALCIUM 8.9 8.6* 8.4* 8.3*  MG  --   --   --  2.2  PHOS  --   --   --  3.0   GFR: Estimated Creatinine Clearance: 50.5 mL/min (A) (by C-G formula based on SCr of 1.32 mg/dL (H)). Liver Function Tests: Recent Labs  Lab 06/30/21 1844 07/01/21 1614 07/03/21 0918  AST 22 27 16   ALT 40 38 28  ALKPHOS 105 109 84  BILITOT 0.5 0.9 0.8  PROT 7.1 6.7 6.0*  ALBUMIN 3.7 3.2* 2.6*   Recent Labs  Lab 07/01/21 1626  LIPASE 25   Recent Labs  Lab 07/01/21 1744  AMMONIA 26   Coagulation Profile: Recent Labs  Lab 07/01/21 1614 07/02/21 0252  INR 1.4* 1.7*   Cardiac Enzymes: No results for input(s): CKTOTAL, CKMB, CKMBINDEX, TROPONINI in the last 168 hours. BNP (last 3 results) No results for input(s): PROBNP in the last 8760 hours. HbA1C: No results for input(s): HGBA1C in the last 72 hours. CBG: No results for input(s): GLUCAP in the last 168 hours. Lipid Profile: No results for input(s): CHOL, HDL, LDLCALC, TRIG, CHOLHDL, LDLDIRECT in the last 72 hours. Thyroid Function Tests: No results for input(s): TSH, T4TOTAL, FREET4, T3FREE, THYROIDAB in the last 72 hours. Anemia Panel: No results for input(s): VITAMINB12, FOLATE, FERRITIN, TIBC, IRON, RETICCTPCT in the last 72 hours. Urine analysis:    Component Value Date/Time   COLORURINE COLORLESS (A) 07/01/2021 1941   APPEARANCEUR CLEAR 07/01/2021 1941   LABSPEC 1.003 (L) 07/01/2021 1941   PHURINE 6.0 07/01/2021 1941   GLUCOSEU NEGATIVE 07/01/2021 1941   HGBUR SMALL (A) 07/01/2021 1941   BILIRUBINUR NEGATIVE 07/01/2021 1941   KETONESUR NEGATIVE 07/01/2021 1941   PROTEINUR NEGATIVE 07/01/2021 1941   NITRITE NEGATIVE 07/01/2021 1941   LEUKOCYTESUR NEGATIVE 07/01/2021 1941   Sepsis  Labs: @LABRCNTIP (procalcitonin:4,lacticidven:4)  ) Recent Results (from the past 240 hour(s))  SARS CORONAVIRUS 2 (TAT 6-24 HRS) Nasopharyngeal Nasopharyngeal Swab     Status: None   Collection Time: 06/27/21  6:37 PM   Specimen: Nasopharyngeal Swab  Result Value Ref Range Status   SARS Coronavirus 2 NEGATIVE NEGATIVE Final    Comment: (NOTE) SARS-CoV-2 target nucleic acids are NOT DETECTED.  The SARS-CoV-2 RNA is generally detectable in upper and lower respiratory specimens during the acute phase of infection. Negative results do not preclude SARS-CoV-2 infection, do not rule out co-infections with other pathogens, and should not be used as the sole basis for treatment or other patient management decisions. Negative results must be combined with clinical observations, patient history, and epidemiological information. The expected result is Negative.  Fact Sheet for Patients: SugarRoll.be  Fact Sheet for Healthcare Providers: https://www.woods-mathews.com/  This test is not yet approved or cleared by the [place] FDA and  has been authorized for detection and/or  diagnosis of SARS-CoV-2 by FDA under an Emergency Use Authorization (EUA). This EUA will remain  in effect (meaning this test can be used) for the duration of the COVID-19 declaration under Se ction 564(b)(1) of the Act, 21 U.S.C. section 360bbb-3(b)(1), unless the authorization is terminated or revoked sooner.  Performed at [facility], [place] [address], [place], [place] [address]   Respiratory (~20 pathogens) panel by PCR     Status: None   Collection Time: 06/27/21  6:46 PM   Specimen: Nasopharyngeal Swab; Respiratory  Result Value Ref Range Status   Adenovirus NOT DETECTED NOT DETECTED Final   Coronavirus 229E NOT DETECTED NOT DETECTED Final    Comment: (NOTE) The Coronavirus on the Respiratory Panel, DOES NOT test for the novel  Coronavirus (2019 nCoV)     Coronavirus HKU1 NOT DETECTED NOT DETECTED Final   Coronavirus NL63 NOT DETECTED NOT DETECTED Final   Coronavirus OC43 NOT DETECTED NOT DETECTED Final   Metapneumovirus NOT DETECTED NOT DETECTED Final   Rhinovirus / Enterovirus NOT DETECTED NOT DETECTED Final   Influenza A NOT DETECTED NOT DETECTED Final   Influenza B NOT DETECTED NOT DETECTED Final   Parainfluenza Virus 1 NOT DETECTED NOT DETECTED Final   Parainfluenza Virus 2 NOT DETECTED NOT DETECTED Final   Parainfluenza Virus 3 NOT DETECTED NOT DETECTED Final   Parainfluenza Virus 4 NOT DETECTED NOT DETECTED Final   Respiratory Syncytial Virus NOT DETECTED NOT DETECTED Final   Bordetella pertussis NOT DETECTED NOT DETECTED Final   Bordetella Parapertussis NOT DETECTED NOT DETECTED Final   Chlamydophila pneumoniae NOT DETECTED NOT DETECTED Final   Mycoplasma pneumoniae NOT DETECTED NOT DETECTED Final    Comment: Performed at [facility], [place]. [address], [place], [place] [address]  Resp Panel by RT-PCR (Flu A&B, Covid) Nasopharyngeal Swab     Status: None   Collection Time: 06/30/21  6:45 PM   Specimen: Nasopharyngeal Swab; Nasopharyngeal(NP) swabs in vial transport medium  Result Value Ref Range Status   SARS Coronavirus 2 by RT PCR NEGATIVE NEGATIVE Final    Comment: (NOTE) SARS-CoV-2 target nucleic acids are NOT DETECTED.  The SARS-CoV-2 RNA is generally detectable in upper respiratory specimens during the acute phase of infection. The lowest concentration of SARS-CoV-2 viral copies this assay can detect is 138 copies/mL. A negative result does not preclude SARS-Cov-2 infection and should not be used as the sole basis for treatment or other patient management decisions. A negative result may occur with  improper specimen collection/handling, submission of specimen other than nasopharyngeal swab, presence of viral mutation(s) within the areas targeted by this assay, and inadequate number of viral copies(<138 copies/mL).  A negative result must be combined with clinical observations, patient history, and epidemiological information. The expected result is Negative.  Fact Sheet for Patients:  EntrepreneurPulse.com.au  Fact Sheet for Healthcare Providers:  IncredibleEmployment.be  This test is no t yet approved or cleared by the [place] FDA and  has been authorized for detection and/or diagnosis of SARS-CoV-2 by FDA under an Emergency Use Authorization (EUA). This EUA will remain  in effect (meaning this test can be used) for the duration of the COVID-19 declaration under Section 564(b)(1) of the Act, 21 U.S.C.section 360bbb-3(b)(1), unless the authorization is terminated  or revoked sooner.       Influenza A by PCR NEGATIVE NEGATIVE Final   Influenza B by PCR NEGATIVE NEGATIVE Final    Comment: (NOTE) The Xpert Xpress SARS-CoV-2/FLU/RSV plus assay is intended  as an aid in the diagnosis of influenza from Nasopharyngeal swab specimens and should not be used as a sole basis for treatment. Nasal washings and aspirates are unacceptable for Xpert Xpress SARS-CoV-2/FLU/RSV testing.  Fact Sheet for Patients: EntrepreneurPulse.com.au  Fact Sheet for Healthcare Providers: IncredibleEmployment.be  This test is not yet approved or cleared by the [place] FDA and has been authorized for detection and/or diagnosis of SARS-CoV-2 by FDA under an Emergency Use Authorization (EUA). This EUA will remain in effect (meaning this test can be used) for the duration of the COVID-19 declaration under Section 564(b)(1) of the Act, 21 U.S.C. section 360bbb-3(b)(1), unless the authorization is terminated or revoked.  Performed at [facility], [address], [place], [place] [address]   Blood culture (routine x 2)     Status: None (Preliminary result)   Collection Time: 06/30/21  9:20 PM   Specimen: BLOOD  Result  Value Ref Range Status   Specimen Description   Final    BLOOD BOTTLES DRAWN AEROBIC AND ANAEROBIC Performed at [facility], [address], [place], [place] [address]    Special Requests   Final    Blood Culture adequate volume LEFT ANTECUBITAL Performed at [facility], [address], [place], [place] [address]    Culture   Final    NO GROWTH 2 DAYS Performed at [facility], [place] [address], [place], [place] [address]    Report Status PENDING  Incomplete  Blood culture (routine x 2)     Status: None (Preliminary result)   Collection Time: 06/30/21  9:30 PM   Specimen: BLOOD  Result Value Ref Range Status   Specimen Description   Final    BLOOD BOTTLES DRAWN AEROBIC AND ANAEROBIC Performed at [facility], [address], [place], [place] [address]    Special Requests   Final    Blood Culture adequate volume RIGHT ANTECUBITAL Performed at [facility], [address] [place], [place] [address]    Culture   Final    NO GROWTH 2 DAYS Performed at [facility], [place] [address], [place], [place] [address]    Report Status PENDING  Incomplete  MRSA Next Gen by PCR, Nasal     Status: None   Collection Time: 07/02/21  3:24 PM   Specimen: Nasal Mucosa; Nasal Swab  Result Value Ref Range Status   MRSA by PCR Next Gen NOT DETECTED NOT DETECTED Final    Comment: (NOTE) The GeneXpert MRSA Assay (FDA approved for NASAL specimens only), is one component of a comprehensive MRSA colonization surveillance program. It is not intended to diagnose MRSA infection nor to guide or monitor treatment for MRSA infections. Test performance is not FDA approved in patients less than 27 years old. Performed at [facility], [place] [address] [place], [place] [address]       Studies: No results found.  Scheduled Meds:  enoxaparin (LOVENOX) injection  40 mg Subcutaneous Q24H   potassium chloride   40 mEq Oral BID    Continuous Infusions:  sodium chloride 75 mL/hr at 07/03/21 1128   piperacillin-tazobactam (ZOSYN)  IV 3.375 g (07/03/21 1100)   promethazine (PHENERGAN) injection (IM or IVPB)       LOS: 2 days     Kayleen Memos, MD [facility] Pager [phone]  If 7PM-7AM, please contact night-coverage [email or web address] Password [facility] 07/03/2021, 5:17 PM

## 2021-07-04 DIAGNOSIS — A419 Sepsis, unspecified organism: Secondary | ICD-10-CM | POA: Diagnosis not present

## 2021-07-04 DIAGNOSIS — R6520 Severe sepsis without septic shock: Secondary | ICD-10-CM | POA: Diagnosis not present

## 2021-07-04 LAB — BASIC METABOLIC PANEL
Anion gap: 8 (ref 5–15)
BUN: 14 mg/dL (ref 8–23)
CO2: 26 mmol/L (ref 22–32)
Calcium: 8.5 mg/dL — ABNORMAL LOW (ref 8.9–10.3)
Chloride: 102 mmol/L (ref 98–111)
Creatinine, Ser: 1.28 mg/dL — ABNORMAL HIGH (ref 0.61–1.24)
GFR, Estimated: 60 mL/min — ABNORMAL LOW (ref >60)
Glucose, Bld: 104 mg/dL — ABNORMAL HIGH (ref 70–99)
Potassium: 3.2 mmol/L — ABNORMAL LOW (ref 3.5–5.1)
Sodium: 136 mmol/L (ref 135–145)

## 2021-07-04 LAB — CBC
HCT: 39.6 % (ref 39.0–52.0)
Hemoglobin: 13.5 g/dL (ref 13.0–17.0)
MCH: 29.7 pg (ref 26.0–34.0)
MCHC: 34.1 g/dL (ref 30.0–36.0)
MCV: 87.2 fL (ref 80.0–100.0)
Platelets: 242 10*3/uL (ref 150–400)
RBC: 4.54 MIL/uL (ref 4.22–5.81)
RDW: 12.7 % (ref 11.5–15.5)
WBC: 11.6 10*3/uL — ABNORMAL HIGH (ref 4.0–10.5)
nRBC: 0 % (ref 0.0–0.2)

## 2021-07-04 MED ORDER — IRBESARTAN 75 MG PO TABS: 75.0000 mg | ORAL_TABLET | Freq: Every day | ORAL | Status: DC

## 2021-07-04 MED ORDER — SPIRONOLACTONE 25 MG PO TABS
25.0000 mg | ORAL_TABLET | Freq: Every day | ORAL | Status: DC
  Administered 2021-07-04 – 2021-07-13 (×10): 25 mg via ORAL
  Filled 2021-07-04 (×10): qty 1

## 2021-07-04 MED ORDER — POTASSIUM CHLORIDE CRYS ER 20 MEQ PO TBCR
20.0000 meq | EXTENDED_RELEASE_TABLET | Freq: Every day | ORAL | Status: DC
  Administered 2021-07-04: 20 meq via ORAL
  Filled 2021-07-04: qty 1

## 2021-07-04 MED ORDER — SPIRONOLACTONE 25 MG PO TABS: 25.0000 mg | ORAL_TABLET | Freq: Every day | ORAL | Status: DC

## 2021-07-04 MED ORDER — IRBESARTAN 75 MG PO TABS
75.0000 mg | ORAL_TABLET | Freq: Every day | ORAL | Status: DC
  Administered 2021-07-04 – 2021-07-13 (×10): 75 mg via ORAL
  Filled 2021-07-04 (×10): qty 1

## 2021-07-04 MED ORDER — SODIUM CHLORIDE 0.9 % IV SOLN: INTRAVENOUS | Status: AC

## 2021-07-04 NOTE — Progress Notes (Signed)
Physical Therapy Treatment Note  Clinical Impression:  Pt admitted with above diagnosis. Comes from home where he lives with his wife in a two story home with bedroom upstairs (has an option to sleep downstairs, but would rather sleep in own bed); Presents to PT with decr balance, unsteadiness at initial standing, and significant balance deficits with amb, including tendency to lose balance to the R, at one point requiring heavy mod assist to prevent fall;  Pt currently with functional limitations due to the deficits listed below (see PT Problem List). Pt will benefit from skilled PT to increase their independence and safety with mobility to allow discharge to the venue listed below.       07/04/21 1900  PT Visit Information  Last PT Received On 07/04/21  Assistance Needed +1  PT/OT/SLP Co-Evaluation/Treatment Yes  Reason for Co-Treatment To address functional/ADL transfers  PT goals addressed during session Mobility/safety with mobility  History of Present Illness Shane Lam is a 70 y.o. male with medical history significant of HTN, BPH, HLD came with new onset of fever and generalized weakness and weight loss; Sepsis; Imaging reveals metastatic liver Ca  Precautions  Precautions Fall  Home Living  Family/patient expects to be discharged to: Private residence  Living Arrangements Spouse/significant other  Available Help at Discharge Family;Available 24 hours/day  Type of Home House  Home Access Stairs to enter  Entrance Stairs-Number of Steps 6  Home Layout Two level;Bed/bath upstairs  Alternate Level Stairs-Number of Steps flight  Alternate Level Stairs-Rails Right  Bathroom Shower/Tub Walk-in shower  Bathroom Toilet Handicapped height  Home Equipment None  Prior Function  Prior Level of Function  Independent/Modified Independent;Driving  Communication  Communication No difficulties (low volume at times)  Pain Assessment  Pain Assessment No/denies pain  Cognition   Arousal/Alertness Awake/alert  Behavior During Therapy Impulsive  Overall Cognitive Status Impaired/Different from baseline  Area of Impairment Safety/judgement;Awareness;Memory  Memory Decreased short-term memory  Safety/Judgement Decreased awareness of safety;Decreased awareness of deficits  Awareness Emergent  General Comments Slow processing and struggling to recall some details of home environment. Wife reports pt was very confused yesterday and seems to be better today. Impulsive during mobility and transfers with decreased awareness of deficits.  Upper Extremity Assessment  Upper Extremity Assessment Defer to OT evaluation  Lower Extremity Assessment  Lower Extremity Assessment Generalized weakness  Cervical / Trunk Assessment  Cervical / Trunk Assessment Normal  Bed Mobility  Overal bed mobility Needs Assistance  Bed Mobility Supine to Sit  Supine to sit Supervision  Transfers  Overall transfer level Needs assistance  Equipment used Rolling walker (2 wheels)  Transfers Sit to/from Stand  Sit to Stand Min guard  General transfer comment stood impulsively from EOB; unsteady at intial stand, but good rise  Ambulation/Gait  Ambulation/Gait assistance Min assist;Mod assist  Gait Distance (Feet) 200 Feet  Assistive device None;Rolling walker (2 wheels)  Gait Pattern/deviations Step-through pattern;Drifts right/left (rending to drift R)  General Gait Details Unsteadiness walking without UE support with multiple losses of balance to R, requiring heavy mod assist to prevent fall; Better walking back to room with bil UE support on RW  Gait velocity slowed  Balance  Sitting balance-Leahy Scale Good  Standing balance support During functional activity  Standing balance-Leahy Scale Fair  PT - End of Session  Equipment Utilized During Treatment Gait belt  Activity Tolerance Patient tolerated treatment well;Patient limited by fatigue  Patient left in chair;with call bell/phone  within reach;with chair alarm set;with family/visitor  present  Nurse Communication Mobility status  PT Assessment  PT Recommendation/Assessment Patient needs continued PT services  PT Visit Diagnosis Unsteadiness on feet (R26.81);Other abnormalities of gait and mobility (R26.89)  PT Problem List Decreased strength;Decreased activity tolerance;Decreased balance;Decreased coordination;Decreased cognition;Decreased knowledge of use of DME;Decreased safety awareness  Barriers to Discharge Other (comment)  Barriers to Discharge Comments fligth of steps to reach bedroom  PT Plan  PT Frequency (ACUTE ONLY) Min 3X/week  PT Treatment/Interventions (ACUTE ONLY) DME instruction;Gait training;Stair training;Functional mobility training;Therapeutic activities;Therapeutic exercise;Balance training;Neuromuscular re-education;Cognitive remediation;Patient/family education  AM-PAC PT "6 Clicks" Mobility Outcome Measure (Version 2)  Help needed turning from your back to your side while in a flat bed without using bedrails? 4  Help needed moving from lying on your back to sitting on the side of a flat bed without using bedrails? 3  Help needed moving to and from a bed to a chair (including a wheelchair)? 3  Help needed standing up from a chair using your arms (e.g., wheelchair or bedside chair)? 3  Help needed to walk in hospital room? 2  Help needed climbing 3-5 steps with a railing?  2  6 Click Score 17  Consider Recommendation of Discharge To: Home with [facility]  Progressive Mobility  What is the highest level of mobility based on the progressive mobility assessment? Level 5 (Walks with assist in room/hall) - Balance while stepping forward/back and can walk in room with assist - Complete  Mobility Ambulated with assistance in hallway  PT Recommendation  Follow Up Recommendations Home health PT (May progress well enough to not need HHPT)  Assistance recommended at discharge Intermittent Supervision/Assistance   Functional Status Assessment Patient has had a recent decline in their functional status and demonstrates the ability to make significant improvements in function in a reasonable and predictable amount of time.  PT equipment Rolling walker (2 wheels);3in1 (PT)  Individuals Consulted  Consulted and Agree with Results and Recommendations Patient  Acute Rehab PT Goals  Patient Stated Goal Did not state, but agreeable to amb  PT Goal Formulation With patient  Time For Goal Achievement 07/18/21  Potential to [place], PT  Acute Rehabilitation Services Pager [phone] Office [phone]     07/04/21 1758  PT Time Calculation  PT Start Time (ACUTE ONLY) 1552  PT Stop Time (ACUTE ONLY) 1621  PT Time Calculation (min) (ACUTE ONLY) 29 min  PT General Charges  $$ ACUTE PT VISIT 1 Visit  PT Evaluation  $PT Eval Moderate Complexity 1 Mod

## 2021-07-04 NOTE — Progress Notes (Addendum)
PROGRESS NOTE  Shane Lam QPY:[id number] DOB: 1951-02-15 DOA: 07/01/2021 PCP: [name], MD  HPI/Recap of past 24 hours: Shane Lam is a 70 y.o. male with medical history significant of HTN, HLD, chronic hypokalemia, who came with new onset of fever and generalized weakness and unintentional weight loss more than 25 pounds in the past several months.  Work-up revealed innumerable lesions in the liver seen on CT scan, an MRI was suggested which revealed findings worrisome for metastatic disease.  8 mm cyst in the head body junction region of the pancreas recommendation for follow-up MRI abdomen without and with contrast in 1 year.  CT scan 06/30/21 could not exclude the possibility of a partially circumferential rectal mass.  Recommended correlation with rectal exam.  Post flexible sigmoidoscopy on 07/03/2021 by Dr. [name], which was unrevealing.   07/04/2021: Seen in his bedside.  His wife and son present in the room.  He is lethargic however easily arousable to voices.  Episode of delirium overnight.  He is alert and oriented x3 but slow in responding to questions.  IR consulted for possible liver lesions biopsies.  Assessment/Plan: Active Problems:   Sepsis ([place])  SIRS, no clear source of infection. WBC 17.3, T-max 102.4, respiration rate 30. No evidence of pneumonia on chest x-ray. No UTI on urinalysis. Blood cultures negative to date. MRSA screen negative. IV antibiotics discontinued on 07/04/2021, no evidence of active infective process.  Innumerable lesions in the liver seen on CT scan/MRI abdomen, worrisome for metastatic disease. GI consulted, appreciate assistance. LFTs are normal IR consulted for possible liver lesions biopsies.  Mild acute metabolic encephalopathy suspect multifactorial, secondary to acute illness, iatrogenic from possibly Phenergan Reorient as needed Monitor closely  Refractory hypokalemia Home spironolactone 25 mg daily and home irbesartan 75 mg  daily restarted Resume home potassium supplement. Monitor serum potassium level.  Bradycardia Heart rate in the 40s to 50s Hold off home felodipine to avoid worsening bradycardia  Essential hypertension BP is not at goal Elevated Restart home oral antihypertensives except felodipine Closely monitor vital signs  Rectal mass, ruled out on flex sigmoidoscopy. Planned flexible sigmoidoscopy completed on 07/03/2021 by Dr. [name], no acute findings.  Acute blood loss anemia, unclear etiology Hemoglobin dropped 11.9 from 13.2. No overt bleeding Continue to closely monitor.  Resolved mild hypovolemic hyponatremia Serum sodium 134>> 136. Continue IV fluid hydration normal saline at 75 cc/h  CKD 2 He appears to be at his baseline creatinine 1.2 with GFR of 60. Continue to monitor for toxic agents, dehydration and hypotension. Continue gentle IV fluid hydration. Continue to monitor urine output Repeat BMP in the morning.  Hyperlipidemia LFTs are within normal limits. Holding off home Crestor 5 mg daily due to liver lesions.  Generalized weakness PT OT to assess Umbilicus tolerated   Code Status: Full code  Family Communication: Updated wife and son at bedside.  Disposition Plan: Likely will discharge to home once GI signs off.   Consultants: GI  Procedures: None  Antimicrobials: IV vancomycin, DC'd on 07/02/2021. IV Zosyn, DC'd on 07/04/2021.  DVT prophylaxis: Subcu Lovenox daily  Status is: Inpatient  Patient will require at least 2 midnights for further evaluation and treatment of present condition.      Objective: Vitals:   07/03/21 1955 07/04/21 0148 07/04/21 0506 07/04/21 0714  BP: (!) 167/90 136/84 (!) 158/82 (!) 173/89  Pulse: (!) 45 (!) 58 (!) 58 (!) 49  Resp: 19 18 18 16   Temp: 98.8 F (37.1 C) 99.1 F (37.3  C) 99.3 F (37.4 C) 99.9 F (37.7 C)  TempSrc: Oral Oral Oral Oral  SpO2: 100% 99% 99% 99%    Intake/Output Summary (Last 24 hours)  at 07/04/2021 1356 Last data filed at 07/03/2021 1807 Gross per 24 hour  Intake 1258.91 ml  Output --  Net 1258.91 ml   There were no vitals filed for this visit.  Exam:  General: 70 y.o. year-old male frail-appearing in no acute distress.  He is somnolent but easily arousable to voices.  Answers to questions appropriately. Cardiovascular: Regular rate and rhythm no rubs or gallops.  Respiratory: Clear to auscultation with no wheezes or rales.  Abdomen: Soft nontender normal bowel sounds present.  Musculoskeletal: No lower extremity edema bilaterally.   Skin: No ulcerative lesions noted. Psychiatry: Mood is appropriate for condition and setting.  Data Reviewed: CBC: Recent Labs  Lab 06/30/21 1844 07/01/21 1614 07/03/21 0918 07/04/21 1307  WBC 12.1* 17.3* 17.1* 11.6*  NEUTROABS  --  15.0*  --   --   HGB 13.1 13.2 11.9* 13.5  HCT 40.2 39.8 36.2* 39.6  MCV 88.9 89.4 89.8 87.2  PLT 223 246 246 657   Basic Metabolic Panel: Recent Labs  Lab 06/30/21 1844 07/01/21 1614 07/02/21 0252 07/03/21 0918  NA 131* 130* 134* 137  K 4.0 4.2 3.8 3.2*  CL 98 96* 104 105  CO2 25 26 22 26   GLUCOSE 113* 107* 103* 97  BUN 20 15 14 11   CREATININE 1.37* 1.43* 1.44* 1.32*  CALCIUM 8.9 8.6* 8.4* 8.3*  MG  --   --   --  2.2  PHOS  --   --   --  3.0   GFR: Estimated Creatinine Clearance: 50.5 mL/min (A) (by C-G formula based on SCr of 1.32 mg/dL (H)). Liver Function Tests: Recent Labs  Lab 06/30/21 1844 07/01/21 1614 07/03/21 0918  AST 22 27 16   ALT 40 38 28  ALKPHOS 105 109 84  BILITOT 0.5 0.9 0.8  PROT 7.1 6.7 6.0*  ALBUMIN 3.7 3.2* 2.6*   Recent Labs  Lab 07/01/21 1626  LIPASE 25   Recent Labs  Lab 07/01/21 1744  AMMONIA 26   Coagulation Profile: Recent Labs  Lab 07/01/21 1614 07/02/21 0252  INR 1.4* 1.7*   Cardiac Enzymes: No results for input(s): CKTOTAL, CKMB, CKMBINDEX, TROPONINI in the last 168 hours. BNP (last 3 results) No results for input(s): PROBNP  in the last 8760 hours. HbA1C: No results for input(s): HGBA1C in the last 72 hours. CBG: No results for input(s): GLUCAP in the last 168 hours. Lipid Profile: No results for input(s): CHOL, HDL, LDLCALC, TRIG, CHOLHDL, LDLDIRECT in the last 72 hours. Thyroid Function Tests: No results for input(s): TSH, T4TOTAL, FREET4, T3FREE, THYROIDAB in the last 72 hours. Anemia Panel: No results for input(s): VITAMINB12, FOLATE, FERRITIN, TIBC, IRON, RETICCTPCT in the last 72 hours. Urine analysis:    Component Value Date/Time   COLORURINE COLORLESS (A) 07/01/2021 1941   APPEARANCEUR CLEAR 07/01/2021 1941   LABSPEC 1.003 (L) 07/01/2021 1941   PHURINE 6.0 07/01/2021 1941   GLUCOSEU NEGATIVE 07/01/2021 1941   HGBUR SMALL (A) 07/01/2021 1941   BILIRUBINUR NEGATIVE 07/01/2021 1941   KETONESUR NEGATIVE 07/01/2021 1941   PROTEINUR NEGATIVE 07/01/2021 1941   NITRITE NEGATIVE 07/01/2021 1941   LEUKOCYTESUR NEGATIVE 07/01/2021 1941   Sepsis Labs: @LABRCNTIP (procalcitonin:4,lacticidven:4)  ) Recent Results (from the past 240 hour(s))  SARS CORONAVIRUS 2 (TAT 6-24 HRS) Nasopharyngeal Nasopharyngeal Swab     Status: None  Collection Time: 06/27/21  6:37 PM   Specimen: Nasopharyngeal Swab  Result Value Ref Range Status   SARS Coronavirus 2 NEGATIVE NEGATIVE Final    Comment: (NOTE) SARS-CoV-2 target nucleic acids are NOT DETECTED.  The SARS-CoV-2 RNA is generally detectable in upper and lower respiratory specimens during the acute phase of infection. Negative results do not preclude SARS-CoV-2 infection, do not rule out co-infections with other pathogens, and should not be used as the sole basis for treatment or other patient management decisions. Negative results must be combined with clinical observations, patient history, and epidemiological information. The expected result is Negative.  Fact Sheet for Patients: SugarRoll.be  Fact Sheet for Healthcare  Providers: https://www.woods-mathews.com/  This test is not yet approved or cleared by the [place] FDA and  has been authorized for detection and/or diagnosis of SARS-CoV-2 by FDA under an Emergency Use Authorization (EUA). This EUA will remain  in effect (meaning this test can be used) for the duration of the COVID-19 declaration under Se ction 564(b)(1) of the Act, 21 U.S.C. section 360bbb-3(b)(1), unless the authorization is terminated or revoked sooner.  Performed at [facility], [place] [address] [place], [place] [address]   Respiratory (~20 pathogens) panel by PCR     Status: None   Collection Time: 06/27/21  6:46 PM   Specimen: Nasopharyngeal Swab; Respiratory  Result Value Ref Range Status   Adenovirus NOT DETECTED NOT DETECTED Final   Coronavirus 229E NOT DETECTED NOT DETECTED Final    Comment: (NOTE) The Coronavirus on the Respiratory Panel, DOES NOT test for the novel  Coronavirus (2019 nCoV)    Coronavirus HKU1 NOT DETECTED NOT DETECTED Final   Coronavirus NL63 NOT DETECTED NOT DETECTED Final   Coronavirus OC43 NOT DETECTED NOT DETECTED Final   Metapneumovirus NOT DETECTED NOT DETECTED Final   Rhinovirus / Enterovirus NOT DETECTED NOT DETECTED Final   Influenza A NOT DETECTED NOT DETECTED Final   Influenza B NOT DETECTED NOT DETECTED Final   Parainfluenza Virus 1 NOT DETECTED NOT DETECTED Final   Parainfluenza Virus 2 NOT DETECTED NOT DETECTED Final   Parainfluenza Virus 3 NOT DETECTED NOT DETECTED Final   Parainfluenza Virus 4 NOT DETECTED NOT DETECTED Final   Respiratory Syncytial Virus NOT DETECTED NOT DETECTED Final   Bordetella pertussis NOT DETECTED NOT DETECTED Final   Bordetella Parapertussis NOT DETECTED NOT DETECTED Final   Chlamydophila pneumoniae NOT DETECTED NOT DETECTED Final   Mycoplasma pneumoniae NOT DETECTED NOT DETECTED Final    Comment: Performed at [facility], [place]. [address] [place], [place] [address]  Resp Panel  by RT-PCR (Flu A&B, Covid) Nasopharyngeal Swab     Status: None   Collection Time: 06/30/21  6:45 PM   Specimen: Nasopharyngeal Swab; Nasopharyngeal(NP) swabs in vial transport medium  Result Value Ref Range Status   SARS Coronavirus 2 by RT PCR NEGATIVE NEGATIVE Final    Comment: (NOTE) SARS-CoV-2 target nucleic acids are NOT DETECTED.  The SARS-CoV-2 RNA is generally detectable in upper respiratory specimens during the acute phase of infection. The lowest concentration of SARS-CoV-2 viral copies this assay can detect is 138 copies/mL. A negative result does not preclude SARS-Cov-2 infection and should not be used as the sole basis for treatment or other patient management decisions. A negative result may occur with  improper specimen collection/handling, submission of specimen other than nasopharyngeal swab, presence of viral mutation(s) within the areas targeted by this assay, and inadequate number of viral copies(<138 copies/mL). A  negative result must be combined with clinical observations, patient history, and epidemiological information. The expected result is Negative.  Fact Sheet for Patients:  EntrepreneurPulse.com.au  Fact Sheet for Healthcare Providers:  IncredibleEmployment.be  This test is no t yet approved or cleared by the [place] FDA and  has been authorized for detection and/or diagnosis of SARS-CoV-2 by FDA under an Emergency Use Authorization (EUA). This EUA will remain  in effect (meaning this test can be used) for the duration of the COVID-19 declaration under Section 564(b)(1) of the Act, 21 U.S.C.section 360bbb-3(b)(1), unless the authorization is terminated  or revoked sooner.       Influenza A by PCR NEGATIVE NEGATIVE Final   Influenza B by PCR NEGATIVE NEGATIVE Final    Comment: (NOTE) The Xpert Xpress SARS-CoV-2/FLU/RSV plus assay is intended as an aid in the diagnosis of influenza from Nasopharyngeal swab  specimens and should not be used as a sole basis for treatment. Nasal washings and aspirates are unacceptable for Xpert Xpress SARS-CoV-2/FLU/RSV testing.  Fact Sheet for Patients: EntrepreneurPulse.com.au  Fact Sheet for Healthcare Providers: IncredibleEmployment.be  This test is not yet approved or cleared by the [place] FDA and has been authorized for detection and/or diagnosis of SARS-CoV-2 by FDA under an Emergency Use Authorization (EUA). This EUA will remain in effect (meaning this test can be used) for the duration of the COVID-19 declaration under Section 564(b)(1) of the Act, 21 U.S.C. section 360bbb-3(b)(1), unless the authorization is terminated or revoked.  Performed at [facility], [address], [place], [place] [address]   Blood culture (routine x 2)     Status: None (Preliminary result)   Collection Time: 06/30/21  9:20 PM   Specimen: BLOOD  Result Value Ref Range Status   Specimen Description   Final    BLOOD BOTTLES DRAWN AEROBIC AND ANAEROBIC Performed at [facility], [address], [place], [place] [address]    Special Requests   Final    Blood Culture adequate volume LEFT ANTECUBITAL Performed at [facility], [address], [place], [place] [address]    Culture   Final    NO GROWTH 3 DAYS Performed at [facility], [place] [address] [place], [place] [address]    Report Status PENDING  Incomplete  Blood culture (routine x 2)     Status: None (Preliminary result)   Collection Time: 06/30/21  9:30 PM   Specimen: BLOOD  Result Value Ref Range Status   Specimen Description   Final    BLOOD BOTTLES DRAWN AEROBIC AND ANAEROBIC Performed at [facility], [address] [place], [place] [address]    Special Requests   Final    Blood Culture adequate volume RIGHT ANTECUBITAL Performed at [facility], [address], [place], [place] [address]    Culture   Final    NO GROWTH 3 DAYS Performed at [facility], [place] [address] [place], [place] [address]    Report Status PENDING  Incomplete  MRSA Next Gen by PCR, Nasal     Status: None   Collection Time: 07/02/21  3:24 PM   Specimen: Nasal Mucosa; Nasal Swab  Result Value Ref Range Status   MRSA by PCR Next Gen NOT DETECTED NOT DETECTED Final    Comment: (NOTE) The GeneXpert MRSA Assay (FDA approved for NASAL specimens only), is one component of a comprehensive MRSA colonization surveillance program. It is not intended to diagnose MRSA infection nor to  guide or monitor treatment for MRSA infections. Test performance is not FDA approved in patients less than 53 years old. Performed at [facility], [place] [address], [place], [place] [address]       Studies: No results found.  Scheduled Meds:  enoxaparin (LOVENOX) injection  40 mg Subcutaneous Q24H   irbesartan  75 mg Oral Daily   LORazepam  0.5 mg Intravenous STAT   potassium chloride SA  20 mEq Oral Daily   spironolactone  25 mg Oral Daily    Continuous Infusions:  sodium chloride 50 mL/hr at 07/04/21 1229     LOS: 3 days     Kayleen Memos, MD [facility] Pager [phone]  If 7PM-7AM, please contact night-coverage [email or web address] Password [email or web address] 07/04/2021, 1:56 PM

## 2021-07-04 NOTE — Evaluation (Signed)
Occupational Therapy Evaluation Patient Details Name: Shane Lam MRN: [id number] DOB: [birth year] Today's Date: 07/04/2021   History of Present Illness Shane Lam is a 70 y.o. male with medical history significant of HTN, BPH, HLD came with new onset of fever and generalized weakness and weight loss; Sepsis; Imaging reveals metastatic liver Ca   Clinical Impression   Pt presents with decreased balance, activity tolerance, and cognition (see below). Pt currently requiring supervision - Min guard with ADLs and Min guard - Min A for functional transfers/mobility. Pt is independent at baseline, highly active, drives, and lives with wife who will be available to provide assistance as needed at home. Pt should be safe to return home without any further skilled OT services once medically cleared. Would likely benefit from [facility] to use as shower chair to improve safety and reduce risk of falls with showering. Will follow acutely to maximize safety/independence with ADLs and functional transfers/mobility prior to return home.      Recommendations for follow up therapy are one component of a multi-disciplinary discharge planning process, led by the attending physician.  Recommendations may be updated based on patient status, additional functional criteria and insurance authorization.   Follow Up Recommendations  No OT follow up    Assistance Recommended at Discharge Intermittent Supervision/Assistance  Functional Status Assessment  Patient has had a recent decline in their functional status and demonstrates the ability to make significant improvements in function in a reasonable and predictable amount of time.  Equipment Recommendations  BSC    Recommendations for Other Services       Precautions / Restrictions Precautions Precautions: Fall Restrictions Weight Bearing Restrictions: No      Mobility Bed Mobility Overal bed mobility: Needs Assistance Bed Mobility: Supine to Sit      Supine to sit: Supervision          Transfers Overall transfer level: Needs assistance Equipment used: Rolling walker (2 wheels) Transfers: Sit to/from Stand Sit to Stand: Min guard                  Balance Overall balance assessment: Needs assistance Sitting-balance support: No upper extremity supported;Feet supported Sitting balance-Leahy Scale: Good     Standing balance support: During functional activity Standing balance-Leahy Scale: Fair Standing balance comment: Requiring assistance to maintain balance when ambulating without device with loss of balance to L side. Improved balance with mobility using RW.                           ADL either performed or assessed with clinical judgement   ADL Overall ADL's : Needs assistance/impaired Eating/Feeding: Independent   Grooming: Supervision/safety;Standing   Upper Body Bathing: Supervision/ safety;Sitting   Lower Body Bathing: Min guard;Sit to/from stand   Upper Body Dressing : Set up;Sitting   Lower Body Dressing: Min guard;Sit to/from stand   Toilet Transfer: Min guard;Ambulation;Rolling walker (2 wheels);Grab bars;Regular Toilet   Toileting- Clothing Manipulation and Hygiene: Supervision/safety       Functional mobility during ADLs: Minimal assistance;Rolling walker (2 wheels) General ADL Comments: Pt limited by balance, activity tolerance, and cognition.     Vision Baseline Vision/History: 1 Wears glasses       Perception     Praxis      Pertinent Vitals/Pain       Hand Dominance     Extremity/Trunk Assessment Upper Extremity Assessment Upper Extremity Assessment: Overall WFL for tasks assessed   Lower Extremity Assessment Lower Extremity  Assessment: Defer to PT evaluation   Cervical / Trunk Assessment Cervical / Trunk Assessment: Normal   Communication Communication Communication: No difficulties   Cognition Arousal/Alertness: Awake/alert Behavior During Therapy:  Impulsive Overall Cognitive Status: Impaired/Different from baseline Area of Impairment: Safety/judgement;Awareness;Memory                     Memory: Decreased short-term memory   Safety/Judgement: Decreased awareness of safety;Decreased awareness of deficits Awareness: Emergent   General Comments: Slow processing and struggling to recall some details of home environment. Wife reports pt was very confused yesterday and seems to be better today. Impulsive during mobility and transfers with decreased awareness of deficits.     General Comments       Exercises     Shoulder Instructions      Home Living Family/patient expects to be discharged to:: Private residence Living Arrangements: Spouse/significant other Available Help at Discharge: Family;Available 24 hours/day Type of Home: House Home Access: Stairs to enter [employer] of Steps: 6   Home Layout: Two level;Bed/bath upstairs Alternate Level Stairs-Number of Steps: flight   Bathroom Shower/Tub: Occupational psychologist: Handicapped height     Home Equipment: None          Prior Functioning/Environment Prior Level of Function : Independent/Modified Independent;Driving                        OT Problem List: Impaired balance (sitting and/or standing);Decreased activity tolerance;Decreased safety awareness;Decreased knowledge of use of DME or AE;Decreased knowledge of precautions;Decreased cognition      OT Treatment/Interventions: Self-care/ADL training;Therapeutic exercise;Neuromuscular education;DME and/or AE instruction;Therapeutic activities;Patient/family education;Balance training    OT Goals(Current goals can be found in the care plan section) Acute Rehab OT Goals Patient Stated Goal: return home OT Goal Formulation: With patient Time For Goal Achievement: 07/18/21 Potential to Achieve Goals: Good  OT Frequency: Min 2X/week   Barriers to D/C:             Co-evaluation PT/OT/SLP Co-Evaluation/Treatment: Yes Reason for Co-Treatment: Necessary to address cognition/behavior during functional activity;For patient/therapist safety   OT goals addressed during session: ADL's and self-care;Proper use of Adaptive equipment and DME      AM-PAC OT "6 Clicks" Daily Activity     Outcome Measure Help from another person eating meals?: None Help from another person taking care of personal grooming?: A Little Help from another person toileting, which includes using toliet, bedpan, or urinal?: A Little Help from another person bathing (including washing, rinsing, drying)?: A Little Help from another person to put on and taking off regular upper body clothing?: A Little Help from another person to put on and taking off regular lower body clothing?: A Little 6 Click Score: 19   End of Session Equipment Utilized During Treatment: Gait belt;Rolling walker (2 wheels) Nurse Communication: Mobility status  Activity Tolerance: Patient tolerated treatment well Patient left: in chair;with call bell/phone within reach;with chair alarm set;with family/visitor present  OT Visit Diagnosis: Unsteadiness on feet (R26.81);Other symptoms and signs involving cognitive function                Time: 2778-2423 OT Time Calculation (min): 42 min Charges:  OT General Charges $OT Visit: 1 Visit OT Evaluation $OT Eval Moderate Complexity: 1 Mod OT Treatments $Therapeutic Activity: 8-22 mins  Amariyon Maynes C, OT/L  Acute Rehab [place] 07/04/2021, 4:38 PM

## 2021-07-04 NOTE — Plan of Care (Signed)
  Problem: Pain Managment: Goal: General experience of comfort will improve Outcome: Progressing   Problem: Safety: Goal: Ability to remain free from injury will improve Outcome: Progressing   

## 2021-07-05 ENCOUNTER — Encounter (HOSPITAL_COMMUNITY): Payer: Self-pay | Admitting: Internal Medicine

## 2021-07-05 DIAGNOSIS — A419 Sepsis, unspecified organism: Secondary | ICD-10-CM | POA: Diagnosis not present

## 2021-07-05 DIAGNOSIS — R6520 Severe sepsis without septic shock: Secondary | ICD-10-CM | POA: Diagnosis not present

## 2021-07-05 LAB — COMPREHENSIVE METABOLIC PANEL
ALT: 34 U/L (ref 0–44)
AST: 36 U/L (ref 15–41)
Albumin: 2.5 g/dL — ABNORMAL LOW (ref 3.5–5.0)
Alkaline Phosphatase: 90 U/L (ref 38–126)
Anion gap: 8 (ref 5–15)
BUN: 19 mg/dL (ref 8–23)
CO2: 23 mmol/L (ref 22–32)
Calcium: 8.5 mg/dL — ABNORMAL LOW (ref 8.9–10.3)
Chloride: 105 mmol/L (ref 98–111)
Creatinine, Ser: 1.18 mg/dL (ref 0.61–1.24)
GFR, Estimated: >60 mL/min (ref >60)
Glucose, Bld: 103 mg/dL — ABNORMAL HIGH (ref 70–99)
Potassium: 3.3 mmol/L — ABNORMAL LOW (ref 3.5–5.1)
Sodium: 136 mmol/L (ref 135–145)
Total Bilirubin: 0.9 mg/dL (ref 0.3–1.2)
Total Protein: 5.8 g/dL — ABNORMAL LOW (ref 6.5–8.1)

## 2021-07-05 LAB — CBC
HCT: 37.4 % — ABNORMAL LOW (ref 39.0–52.0)
Hemoglobin: 12.7 g/dL — ABNORMAL LOW (ref 13.0–17.0)
MCH: 29.7 pg (ref 26.0–34.0)
MCHC: 34 g/dL (ref 30.0–36.0)
MCV: 87.6 fL (ref 80.0–100.0)
Platelets: 259 10*3/uL (ref 150–400)
RBC: 4.27 MIL/uL (ref 4.22–5.81)
RDW: 13 % (ref 11.5–15.5)
WBC: 11.4 10*3/uL — ABNORMAL HIGH (ref 4.0–10.5)
nRBC: 0 % (ref 0.0–0.2)

## 2021-07-05 LAB — LIPID PANEL
Cholesterol: 101 mg/dL (ref 0–200)
HDL: 18 mg/dL — ABNORMAL LOW (ref >40)
LDL Cholesterol: 68 mg/dL (ref 0–99)
Total CHOL/HDL Ratio: 5.6 RATIO
Triglycerides: 75 mg/dL (ref <150)
VLDL: 15 mg/dL (ref 0–40)

## 2021-07-05 LAB — PROCALCITONIN: Procalcitonin: 1.19 ng/mL

## 2021-07-05 LAB — HEMOGLOBIN A1C
Hgb A1c MFr Bld: 5.6 % (ref 4.8–5.6)
Mean Plasma Glucose: 114.02 mg/dL

## 2021-07-05 LAB — PHOSPHORUS: Phosphorus: 2.5 mg/dL (ref 2.5–4.6)

## 2021-07-05 LAB — MAGNESIUM: Magnesium: 2.2 mg/dL (ref 1.7–2.4)

## 2021-07-05 MED ORDER — POTASSIUM CHLORIDE CRYS ER 20 MEQ PO TBCR
20.0000 meq | EXTENDED_RELEASE_TABLET | Freq: Two times a day (BID) | ORAL | Status: DC
  Administered 2021-07-05 – 2021-07-09 (×8): 20 meq via ORAL
  Filled 2021-07-05 (×9): qty 1

## 2021-07-05 NOTE — Progress Notes (Signed)
Occupational Therapy Treatment Patient Details Name: Shane Lam MRN: [id number] DOB: [birth year] Today's Date: 07/05/2021   History of present illness Shane Lam is a 70 y.o. male with medical history significant of HTN, BPH, HLD came with new onset of fever and generalized weakness and weight loss; Sepsis; Imaging reveals metastatic liver Ca   OT comments  Pt is progressing towards OT goals. Remains limited by balance and cognition. Continues to be impulsive with functional activity and mobility, exhibiting decreased awareness of deficits. During session, pt completed LB dressing with Min guard (see details below). Will continue to follow acutely.   Recommendations for follow up therapy are one component of a multi-disciplinary discharge planning process, led by the attending physician.  Recommendations may be updated based on patient status, additional functional criteria and insurance authorization.    Follow Up Recommendations  No OT follow up    Assistance Recommended at Discharge Intermittent Supervision/Assistance  Equipment Recommendations  [facility]    Recommendations for Other Services      Precautions / Restrictions Precautions Precautions: Fall Restrictions Weight Bearing Restrictions: No       Mobility Bed Mobility Overal bed mobility: Needs Assistance Bed Mobility: Supine to Sit;Sit to Supine     Supine to sit: Supervision Sit to supine: Supervision        Transfers Overall transfer level: Needs assistance Equipment used: Rolling walker (2 wheels) Transfers: Sit to/from Stand Sit to Stand: Min guard                 Balance Overall balance assessment: Needs assistance Sitting-balance support: No upper extremity supported;Feet supported Sitting balance-Leahy Scale: Good     Standing balance support: During functional activity Standing balance-Leahy Scale: Fair Standing balance comment: Able to let go of walker to pull pants over hips, however  noted swaying and unsteadiness when standing without support.                           ADL either performed or assessed with clinical judgement   ADL Overall ADL's : Needs assistance/impaired                     Lower Body Dressing: Min guard;Sit to/from stand Lower Body Dressing Details (indicate cue type and reason): Donned/doffed jeans and socks at EOB. Requiring cues for pacing and safety as pt tried to stand before pant legs were completely over feet.               General ADL Comments: Pt limited by balance, activity tolerance, and cognition. Remains impulsive during funcitonal activity.     Vision       Perception     Praxis      Cognition Arousal/Alertness: Awake/alert Behavior During Therapy: Impulsive Overall Cognitive Status: Impaired/Different from baseline                                 General Comments: Remains impulsive with lack of awareness of deficits.          Exercises     Shoulder Instructions       General Comments      Pertinent Vitals/ Pain          Home Living  Prior Functioning/Environment              Frequency  Min 2X/week        Progress Toward Goals  OT Goals(current goals can now be found in the care plan section)  Progress towards OT goals: Progressing toward goals  Acute Rehab OT Goals Patient Stated Goal: return home OT Goal Formulation: With patient Time For Goal Achievement: 07/18/21 Potential to Achieve Goals: Good ADL Goals Pt Will Perform Grooming: with modified independence;standing Pt Will Perform Lower Body Bathing: with modified independence Pt Will Perform Lower Body Dressing: with modified independence;sit to/from stand Pt Will Transfer to Toilet: with modified independence;regular height toilet;ambulating Pt Will Perform Toileting - Clothing Manipulation and hygiene: with modified independence  Plan  Discharge plan remains appropriate;Frequency remains appropriate    Co-evaluation                 AM-PAC OT "6 Clicks" Daily Activity     Outcome Measure   Help from another person eating meals?: None Help from another person taking care of personal grooming?: A Little Help from another person toileting, which includes using toliet, bedpan, or urinal?: A Little Help from another person bathing (including washing, rinsing, drying)?: A Little Help from another person to put on and taking off regular upper body clothing?: A Little Help from another person to put on and taking off regular lower body clothing?: A Little 6 Click Score: 19    End of Session Equipment Utilized During Treatment: Rolling walker (2 wheels)  OT Visit Diagnosis: Unsteadiness on feet (R26.81);Other symptoms and signs involving cognitive function   Activity Tolerance Patient tolerated treatment well   Patient Left with family/visitor present;in bed;with call bell/phone within reach   Nurse Communication Mobility status        Time: 3875-6433 OT Time Calculation (min): 16 min  Charges: OT General Charges $OT Visit: 1 Visit OT Treatments $Self Care/Home Management : 8-22 mins  Danitra Payano C, OT/L  Acute Rehab [facility] 07/05/2021, 5:24 PM

## 2021-07-05 NOTE — Progress Notes (Signed)
PROGRESS NOTE  Shane Lam OBS:[id number] DOB: 24-Jun-1951 DOA: 07/01/2021 PCP: [name], MD  HPI/Recap of past 24 hours: Shane Lam is a 70 y.o. male with medical history significant of HTN, HLD, chronic hypokalemia, who came with new onset of fever and generalized weakness and unintentional weight loss more than 25 pounds in the past several months.  Work-up revealed innumerable lesions in the liver seen on CT scan, an MRI was suggested which revealed findings worrisome for metastatic disease.  8 mm cyst in the head body junction region of the pancreas recommendation for follow-up MRI abdomen without and with contrast in 1 year.  CT scan 06/30/21 could not exclude the possibility of a partially circumferential rectal mass.  Recommended correlation with rectal exam.  Post flexible sigmoidoscopy on 07/03/2021 by Dr. [name], which was unrevealing.  IR consulted for possible liver lesions biopsies.  Plan for liver biopsy on 07/06/2021, n.p.o. after midnight.  07/05/2021: Patient was seen and examined bedside.  His wife was present in the room.  Still very weak however he had no new complaints.   Assessment/Plan: Active Problems:   Sepsis ([place])  SIRS, no clear source of infection. WBC 17.3, T-max 102.4, respiration rate 30. No evidence of pneumonia on chest x-ray. No UTI on urinalysis. Blood cultures negative to date. MRSA screen negative. IV antibiotics discontinued on 07/04/2021, no evidence of active infective process.  Innumerable lesions in the liver seen on CT scan/MRI abdomen, worrisome for metastatic disease. GI consulted, appreciate assistance. LFTs are normal IR consulted for possible liver lesions biopsies. Plan follow liver biopsies on 07/06/2021.    Mild acute metabolic encephalopathy suspect multifactorial, secondary to acute illness, iatrogenic from possibly Phenergan Reorient as needed Monitor closely  Refractory hypokalemia Home spironolactone 25 mg daily and home  irbesartan 75 mg daily restarted Continue home potassium supplement 20 mEq twice daily. Monitor serum potassium level.  Bradycardia Heart rate in the 40s to 50s Hold off home felodipine to avoid worsening bradycardia  Essential hypertension BP is not at goal Elevated Continue home oral antihypertensives except felodipine Closely monitor vital signs  Rectal mass, ruled out on flex sigmoidoscopy. Planned flexible sigmoidoscopy completed on 07/03/2021 by Dr. [name], no acute findings.  Acute blood loss anemia, unclear etiology Hemoglobin dropped 11.9 from 13.2. No overt bleeding Continue to closely monitor.  Resolved mild hypovolemic hyponatremia Serum sodium 134>> 136. Continue IV fluid hydration normal saline at 75 cc/h  CKD 2 He appears to be at his baseline creatinine 1.2 with GFR of 60. Continue to monitor for toxic agents, dehydration and hypotension. Continue gentle IV fluid hydration. Continue to monitor urine output Repeat BMP in the morning.  Hyperlipidemia LFTs are within normal limits. Holding off home Crestor 5 mg daily due to liver lesions.  Generalized weakness Seen by physical therapy and Occupational Therapy. Continue to mobilize as tolerated.   Code Status: Full code  Family Communication: Updated wife and son at bedside.  Disposition Plan: Likely will discharge to home once GI signs off.   Consultants: GI  Procedures: None  Antimicrobials: IV vancomycin, DC'd on 07/02/2021. IV Zosyn, DC'd on 07/04/2021.  DVT prophylaxis: Subcu Lovenox daily  Status is: Inpatient  Patient will require at least 2 midnights for further evaluation and treatment of present condition.      Objective: Vitals:   07/04/21 2048 07/05/21 0425 07/05/21 0425 07/05/21 0850  BP: 112/77 (!) 154/98 (!) 154/98 (!) 120/104  Pulse: 62 (!) 47 (!) 50 86  Resp:  18  Temp: 98.4 F (36.9 C) 98.2 F (36.8 C) 98.2 F (36.8 C) 97.7 F (36.5 C)  TempSrc: Oral Oral Oral  Oral  SpO2: 100% 100% 100% 100%    Intake/Output Summary (Last 24 hours) at 07/05/2021 1539 Last data filed at 07/05/2021 1100 Gross per 24 hour  Intake 240 ml  Output --  Net 240 ml   There were no vitals filed for this visit.  Exam:  General: 70 y.o. year-old male frail-appearing in no acute distress.  He is alert and oriented x3.   Cardiovascular: Regular rate and rhythm with no rubs or gallops. Respiratory: Clear to auscultation no wheezes or rales. Abdomen: Soft nontender normal bowel sounds present. Musculoskeletal: No lower extremity edema bilaterally.   Skin: No ulcerative lesions noted. Psychiatry: Mood is appropriate for condition and setting.  Data Reviewed: CBC: Recent Labs  Lab 06/30/21 1844 07/01/21 1614 07/03/21 0918 07/04/21 1307 07/05/21 0718  WBC 12.1* 17.3* 17.1* 11.6* 11.4*  NEUTROABS  --  15.0*  --   --   --   HGB 13.1 13.2 11.9* 13.5 12.7*  HCT 40.2 39.8 36.2* 39.6 37.4*  MCV 88.9 89.4 89.8 87.2 87.6  PLT 223 246 246 242 026   Basic Metabolic Panel: Recent Labs  Lab 07/01/21 1614 07/02/21 0252 07/03/21 0918 07/04/21 1307 07/05/21 0718  NA 130* 134* 137 136 136  K 4.2 3.8 3.2* 3.2* 3.3*  CL 96* 104 105 102 105  CO2 26 22 26 26 23   GLUCOSE 107* 103* 97 104* 103*  BUN 15 14 11 14 19   CREATININE 1.43* 1.44* 1.32* 1.28* 1.18  CALCIUM 8.6* 8.4* 8.3* 8.5* 8.5*  MG  --   --  2.2  --  2.2  PHOS  --   --  3.0  --  2.5   GFR: Estimated Creatinine Clearance: 56.5 mL/min (by C-G formula based on SCr of 1.18 mg/dL). Liver Function Tests: Recent Labs  Lab 06/30/21 1844 07/01/21 1614 07/03/21 0918 07/05/21 0718  AST 22 27 16  36  ALT 40 38 28 34  ALKPHOS 105 109 84 90  BILITOT 0.5 0.9 0.8 0.9  PROT 7.1 6.7 6.0* 5.8*  ALBUMIN 3.7 3.2* 2.6* 2.5*   Recent Labs  Lab 07/01/21 1626  LIPASE 25   Recent Labs  Lab 07/01/21 1744  AMMONIA 26   Coagulation Profile: Recent Labs  Lab 07/01/21 1614 07/02/21 0252  INR 1.4* 1.7*   Cardiac  Enzymes: No results for input(s): CKTOTAL, CKMB, CKMBINDEX, TROPONINI in the last 168 hours. BNP (last 3 results) No results for input(s): PROBNP in the last 8760 hours. HbA1C: Recent Labs    07/05/21 0718  HGBA1C 5.6   CBG: No results for input(s): GLUCAP in the last 168 hours. Lipid Profile: Recent Labs    07/05/21 0718  CHOL 101  HDL 18*  LDLCALC 68  TRIG 75  CHOLHDL 5.6   Thyroid Function Tests: No results for input(s): TSH, T4TOTAL, FREET4, T3FREE, THYROIDAB in the last 72 hours. Anemia Panel: No results for input(s): VITAMINB12, FOLATE, FERRITIN, TIBC, IRON, RETICCTPCT in the last 72 hours. Urine analysis:    Component Value Date/Time   COLORURINE COLORLESS (A) 07/01/2021 1941   APPEARANCEUR CLEAR 07/01/2021 1941   LABSPEC 1.003 (L) 07/01/2021 1941   PHURINE 6.0 07/01/2021 1941   GLUCOSEU NEGATIVE 07/01/2021 1941   HGBUR SMALL (A) 07/01/2021 1941   BILIRUBINUR NEGATIVE 07/01/2021 [place] NEGATIVE 07/01/2021 [place] NEGATIVE 07/01/2021 1941  NITRITE NEGATIVE 07/01/2021 1941   LEUKOCYTESUR NEGATIVE 07/01/2021 1941   Sepsis Labs: @LABRCNTIP (procalcitonin:4,lacticidven:4)  ) Recent Results (from the past 240 hour(s))  SARS CORONAVIRUS 2 (TAT 6-24 HRS) Nasopharyngeal Nasopharyngeal Swab     Status: None   Collection Time: 06/27/21  6:37 PM   Specimen: Nasopharyngeal Swab  Result Value Ref Range Status   SARS Coronavirus 2 NEGATIVE NEGATIVE Final    Comment: (NOTE) SARS-CoV-2 target nucleic acids are NOT DETECTED.  The SARS-CoV-2 RNA is generally detectable in upper and lower respiratory specimens during the acute phase of infection. Negative results do not preclude SARS-CoV-2 infection, do not rule out co-infections with other pathogens, and should not be used as the sole basis for treatment or other patient management decisions. Negative results must be combined with clinical observations, patient history, and epidemiological  information. The expected result is Negative.  Fact Sheet for Patients: SugarRoll.be  Fact Sheet for Healthcare Providers: https://www.woods-mathews.com/  This test is not yet approved or cleared by the [place] FDA and  has been authorized for detection and/or diagnosis of SARS-CoV-2 by FDA under an Emergency Use Authorization (EUA). This EUA will remain  in effect (meaning this test can be used) for the duration of the COVID-19 declaration under Se ction 564(b)(1) of the Act, 21 U.S.C. section 360bbb-3(b)(1), unless the authorization is terminated or revoked sooner.  Performed at [facility], [place] [address] [place], [place] [address]   Respiratory (~20 pathogens) panel by PCR     Status: None   Collection Time: 06/27/21  6:46 PM   Specimen: Nasopharyngeal Swab; Respiratory  Result Value Ref Range Status   Adenovirus NOT DETECTED NOT DETECTED Final   Coronavirus 229E NOT DETECTED NOT DETECTED Final    Comment: (NOTE) The Coronavirus on the Respiratory Panel, DOES NOT test for the novel  Coronavirus (2019 nCoV)    Coronavirus HKU1 NOT DETECTED NOT DETECTED Final   Coronavirus NL63 NOT DETECTED NOT DETECTED Final   Coronavirus OC43 NOT DETECTED NOT DETECTED Final   Metapneumovirus NOT DETECTED NOT DETECTED Final   Rhinovirus / Enterovirus NOT DETECTED NOT DETECTED Final   Influenza A NOT DETECTED NOT DETECTED Final   Influenza B NOT DETECTED NOT DETECTED Final   Parainfluenza Virus 1 NOT DETECTED NOT DETECTED Final   Parainfluenza Virus 2 NOT DETECTED NOT DETECTED Final   Parainfluenza Virus 3 NOT DETECTED NOT DETECTED Final   Parainfluenza Virus 4 NOT DETECTED NOT DETECTED Final   Respiratory Syncytial Virus NOT DETECTED NOT DETECTED Final   Bordetella pertussis NOT DETECTED NOT DETECTED Final   Bordetella Parapertussis NOT DETECTED NOT DETECTED Final   Chlamydophila pneumoniae NOT DETECTED NOT DETECTED Final    Mycoplasma pneumoniae NOT DETECTED NOT DETECTED Final    Comment: Performed at [facility], [place]. [address] [place],  [address]  Resp Panel by RT-PCR (Flu A&B, Covid) Nasopharyngeal Swab     Status: None   Collection Time: 06/30/21  6:45 PM   Specimen: Nasopharyngeal Swab; Nasopharyngeal(NP) swabs in vial transport medium  Result Value Ref Range Status   SARS Coronavirus 2 by RT PCR NEGATIVE NEGATIVE Final    Comment: (NOTE) SARS-CoV-2 target nucleic acids are NOT DETECTED.  The SARS-CoV-2 RNA is generally detectable in upper respiratory specimens during the acute phase of infection. The lowest concentration of SARS-CoV-2 viral copies this assay can detect is 138 copies/mL. A negative result does not preclude SARS-Cov-2 infection and should not be used as the sole basis for  treatment or other patient management decisions. A negative result may occur with  improper specimen collection/handling, submission of specimen other than nasopharyngeal swab, presence of viral mutation(s) within the areas targeted by this assay, and inadequate number of viral copies(<138 copies/mL). A negative result must be combined with clinical observations, patient history, and epidemiological information. The expected result is Negative.  Fact Sheet for Patients:  EntrepreneurPulse.com.au  Fact Sheet for Healthcare Providers:  IncredibleEmployment.be  This test is no t yet approved or cleared by the [place] FDA and  has been authorized for detection and/or diagnosis of SARS-CoV-2 by FDA under an Emergency Use Authorization (EUA). This EUA will remain  in effect (meaning this test can be used) for the duration of the COVID-19 declaration under Section 564(b)(1) of the Act, 21 U.S.C.section 360bbb-3(b)(1), unless the authorization is terminated  or revoked sooner.       Influenza A by PCR NEGATIVE NEGATIVE Final   Influenza B by PCR NEGATIVE  NEGATIVE Final    Comment: (NOTE) The Xpert Xpress SARS-CoV-2/FLU/RSV plus assay is intended as an aid in the diagnosis of influenza from Nasopharyngeal swab specimens and should not be used as a sole basis for treatment. Nasal washings and aspirates are unacceptable for Xpert Xpress SARS-CoV-2/FLU/RSV testing.  Fact Sheet for Patients: EntrepreneurPulse.com.au  Fact Sheet for Healthcare Providers: IncredibleEmployment.be  This test is not yet approved or cleared by the [place] FDA and has been authorized for detection and/or diagnosis of SARS-CoV-2 by FDA under an Emergency Use Authorization (EUA). This EUA will remain in effect (meaning this test can be used) for the duration of the COVID-19 declaration under Section 564(b)(1) of the Act, 21 U.S.C. section 360bbb-3(b)(1), unless the authorization is terminated or revoked.  Performed at [facility], [address] [place], [place] [address]   Blood culture (routine x 2)     Status: None (Preliminary result)   Collection Time: 06/30/21  9:20 PM   Specimen: BLOOD  Result Value Ref Range Status   Specimen Description   Final    BLOOD BOTTLES DRAWN AEROBIC AND ANAEROBIC Performed at [facility], [address], [place], [place] [address]    Special Requests   Final    Blood Culture adequate volume LEFT ANTECUBITAL Performed at [facility], [address] [place], [place] [address]    Culture   Final    NO GROWTH 4 DAYS Performed at [facility], [place] [address], [place], [place] [address]    Report Status PENDING  Incomplete  Blood culture (routine x 2)     Status: None (Preliminary result)   Collection Time: 06/30/21  9:30 PM   Specimen: BLOOD  Result Value Ref Range Status   Specimen Description   Final    BLOOD BOTTLES DRAWN AEROBIC AND ANAEROBIC Performed at [facility], [address], [place], [place] [address]    Special Requests   Final    Blood Culture adequate volume RIGHT ANTECUBITAL Performed at [facility], [address] [place], [place] [address]    Culture   Final    NO GROWTH 4 DAYS Performed at [facility], [place] [address] [place], [place] [address]    Report Status PENDING  Incomplete  MRSA Next Gen by PCR, Nasal     Status: None   Collection Time: 07/02/21  3:24 PM   Specimen: Nasal Mucosa; Nasal Swab  Result Value Ref Range Status   MRSA by PCR  Next Gen NOT DETECTED NOT DETECTED Final    Comment: (NOTE) The GeneXpert MRSA Assay (FDA approved for NASAL specimens only), is one component of a comprehensive MRSA colonization surveillance program. It is not intended to diagnose MRSA infection nor to guide or monitor treatment for MRSA infections. Test performance is not FDA approved in patients less than 42 years old. Performed at [facility], [place] [address] [place], [place] [address]       Studies: No results found.  Scheduled Meds:  enoxaparin (LOVENOX) injection  40 mg Subcutaneous Q24H   irbesartan  75 mg Oral Daily   potassium chloride SA  20 mEq Oral BID   spironolactone  25 mg Oral Daily    Continuous Infusions:  sodium chloride 50 mL/hr at 07/05/21 0610     LOS: 4 days     Kayleen Memos, MD [facility] Pager [phone]  If 7PM-7AM, please contact night-coverage [email or web address] Password [facility] 07/05/2021, 3:39 PM

## 2021-07-05 NOTE — Consult Note (Signed)
Chief Complaint: Patient was seen in consultation today for  Chief Complaint  Patient presents with   Emesis    Referring Physician(s): Dr. Nevada Crane  Supervising Physician: Aletta Edouard  Patient Status: [facility] - In-pt  History of Present Illness: Shane Lam is a 70 y.o. male with a medical history significant for HTN and chronic hypokalemia who presented to the ED 07/01/21 with fever, generalized weakness and unintentional weight loss greater than 25 pounds over the past several months. CT showed findings concerning for metastatic disease - additional imaging ordered.  MR Abdomen 07/01/21 IMPRESSION: 1. Examination is quite limited due to respiratory motion. 2. Innumerable lesions throughout the liver appear to be diffusion positive. Findings worrisome for metastatic disease. 3. 8 mm cyst in the head body junction region the pancreas. No worrisome MR imaging features. Recommend follow-up MRI abdomen without and with contrast in 1 year. 4. On the CT scan could not exclude the possibility of a partially circumferential rectal mass. Recommend correlation with rectal exam. 5. PET-CT may be helpful for further evaluation.  A flexible sigmoidoscopy performed 07/03/21 revealed no mass.  Interventional Radiology has been asked to evaluate this patient for an image-guided liver lesion biopsy for further work up. This case has been reviewed and procedure approved by Dr. Kathlene Cote    Past Medical History:  Diagnosis Date   Allergic rhinitis due to pollen    Essential hypertension, malignant    Nonspecific abnormal electrocardiogram (ECG) (EKG)    Pure hypercholesterolemia     History reviewed. No pertinent surgical history.  Allergies: Tomato  Medications: Prior to Admission medications   Medication Sig Start Date End Date Taking? Authorizing Provider  acetaminophen (TYLENOL) 500 MG tablet Take 1,000 mg by mouth every 6 (six) hours as needed for moderate pain.   Yes [provider]  Ascorbic Acid (VITAMIN C) 1000 MG tablet Take 1,000 mg by mouth daily.   Yes [provider]  Cholecalciferol (VITAMIN D3) 1000 UNITS CAPS Take 10,000 Units by mouth daily.   Yes [provider]  Coenzyme Q10 (CO Q 10) 100 MG CAPS Take 2 tablets by mouth daily at 6 (six) AM.   Yes [provider]  felodipine (PLENDIL) 10 MG 24 hr tablet Take 10 mg by mouth daily.   Yes [provider]  Magnesium 400 MG CAPS Take 400 mg by mouth daily.   Yes [provider]  Multiple Vitamin (MULTIVITAMIN) tablet Take 1 tablet by mouth daily.     Yes [provider]  potassium chloride SA (KLOR-CON) 20 MEQ tablet Take 20 mEq by mouth daily.   Yes [provider]  PROTEASE-BETAINE HCL PO Take 2 tablets by mouth daily.   Yes [provider]  spironolactone (ALDACTONE) 25 MG tablet Take 25 mg by mouth daily. 11/21/20  Yes [provider]  tadalafil (CIALIS) 20 MG tablet Take 20 mg by mouth daily as needed for erectile dysfunction. 06/22/21  Yes [provider]  telmisartan (MICARDIS) 80 MG tablet Take 80 mg by mouth daily. 10/27/20  Yes [provider]  Zinc 100 MG TABS Take 100 mg by mouth daily.   Yes [provider]  rosuvastatin (CRESTOR) 5 MG tablet Take 1 tablet (5 mg total) by mouth daily. Patient not taking: No sig reported 04/23/21 07/22/21  Pixie Casino, MD     Family History  Problem Relation Age of Onset   CAD Mother    CAD Father     Social History  Socioeconomic History   Marital status: Married    Spouse name: Not on file   Number of children: Not on file   Years of education: Not on file   Highest education level: Not on file  Occupational History   Not on file  Tobacco Use   Smoking status: Never   Smokeless tobacco: Never  Vaping Use   Vaping Use: Never used  Substance and Sexual Activity   Alcohol use: Never   Drug use: Never   Sexual activity: Not on  file  Other Topics Concern   Not on file  Social History Narrative   Not on file   Social Determinants of Health   Financial Resource Strain: Not on file  Food Insecurity: Not on file  Transportation Needs: Not on file  Physical Activity: Not on file  Stress: Not on file  Social Connections: Not on file    Review of Systems: A 12 point ROS discussed and pertinent positives are indicated in the HPI above.  All other systems are negative.  Review of Systems  Constitutional:  Positive for appetite change and fatigue.  Respiratory:  Negative for cough and shortness of breath.   Cardiovascular:  Negative for chest pain and leg swelling.  Gastrointestinal:  Negative for abdominal pain, diarrhea, nausea and vomiting.  Neurological:  Negative for headaches.   Vital Signs: BP (!) 120/104 (BP Location: Left Arm)   Pulse 86   Temp 97.7 F (36.5 C) (Oral)   Resp 18   SpO2 100%   Physical Exam Constitutional:      General: He is not in acute distress.    Appearance: He is not ill-appearing.  HENT:     Mouth/Throat:     Mouth: Mucous membranes are moist.     Pharynx: Oropharynx is clear.  Cardiovascular:     Rate and Rhythm: Normal rate and regular rhythm.     Pulses: Normal pulses.     Heart sounds: Normal heart sounds.  Pulmonary:     Effort: Pulmonary effort is normal.     Breath sounds: Normal breath sounds.  Abdominal:     General: Bowel sounds are normal.     Palpations: Abdomen is soft.  Skin:    General: Skin is warm and dry.  Neurological:     Mental Status: He is alert and oriented to person, place, and time.    Imaging: DG Chest 2 View  Result Date: 06/30/2021 CLINICAL DATA:  Fever. EXAM: CHEST - 2 VIEW COMPARISON:  Radiograph 10/10/2006 FINDINGS: The cardiomediastinal contours are normal. Minor right middle lobe atelectasis. Pulmonary vasculature is normal. No consolidation, pleural effusion, or pneumothorax. Thoracic spondylosis with endplate spurring. No  acute osseous abnormalities are seen. IMPRESSION: Minor right middle lobe atelectasis.  No evidence of pneumonia Electronically Signed   By: Keith Rake M.D.   On: 06/30/2021 21:08   CT Head Wo Contrast  Result Date: 06/30/2021 CLINICAL DATA:  Fever started on Friday, Seen at [facility] and cleared without anything abnormal. Covid test x 3 which have been negative, yesterday nausea, today lack of appetite and more sleepy. EXAM: CT HEAD WITHOUT CONTRAST TECHNIQUE: Contiguous axial images were obtained from the base of the skull through the vertex without intravenous contrast. COMPARISON:  None. FINDINGS: Brain: No evidence of acute infarction, hemorrhage, hydrocephalus, extra-axial collection or mass lesion/mass effect. Vascular: No hyperdense vessel or unexpected calcification. Skull: Normal. Negative for fracture or focal lesion. Sinuses/Orbits: Globes and orbits are unremarkable. Visualized sinuses are clear. Other:  None. IMPRESSION: Normal unenhanced CT scan of the brain. Electronically Signed   By: Lajean Manes M.D.   On: 06/30/2021 21:55   MR Abdomen W or Wo Contrast  Result Date: 07/01/2021 CLINICAL DATA:  Liver lesions seen on CT scan. EXAM: MRI ABDOMEN WITHOUT AND WITH CONTRAST TECHNIQUE: Multiplanar multisequence MR imaging of the abdomen was performed both before and after the administration of intravenous contrast. CONTRAST:  45mL GADAVIST GADOBUTROL 1 MMOL/ML IV SOLN COMPARISON:  CT scan 06/30/2021 FINDINGS: Examination is quite limited due to respiratory motion. The patient could not hold his breath for this examination. Lower chest: The lung bases grossly clear. No worrisome pulmonary lesions. No pleural or pericardial effusion. Hepatobiliary: Innumerable lesions throughout the spleen appear to be diffusion positive. No intrahepatic biliary dilatation. Normal caliber and course of the common bile duct. The gallbladder is unremarkable. Pancreas: 8 mm cyst is noted in the head body junction region  the pancreas as seen on the CT scan. No worrisome MR imaging features. No ductal dilatation. Spleen:  Normal size.  Small splenic cyst noted. Adrenals/Urinary Tract: The adrenal glands are unremarkable. Simple appearing renal cysts bilaterally. The largest cyst is on the right side and measures 10 cm. Stomach/Bowel: The stomach, duodenum, visualized small visualized colon are grossly. On the CT scan could not exclude the possibility of a partially circumferential rectal mass. Recommend correlation with rectal exam. Vascular/Lymphatic: No aortic aneurysm or dissection. The major venous structures are patent. No mesenteric or retroperitoneal mass or adenopathy. Other:  No ascites or abdominal wall hernia. Musculoskeletal: 2 no significant bony findings. IMPRESSION: 1. Examination is quite limited due to respiratory motion. 2. Innumerable lesions throughout the liver appear to be diffusion positive. Findings worrisome for metastatic disease. 3. 8 mm cyst in the head body junction region the pancreas. No worrisome MR imaging features. Recommend follow-up MRI abdomen without and with contrast in 1 year. 4. On the CT scan could not exclude the possibility of a partially circumferential rectal mass. Recommend correlation with rectal exam. 5. PET-CT may be helpful for further evaluation. Electronically Signed   By: Marijo Sanes M.D.   On: 07/01/2021 16:07   CT ABDOMEN PELVIS W CONTRAST  Result Date: 06/30/2021 CLINICAL DATA:  Abdominal pain, fever and unintended weight loss. Concern for intra-abdominal malignancy. EXAM: CT ABDOMEN AND PELVIS WITH CONTRAST TECHNIQUE: Multidetector CT imaging of the abdomen and pelvis was performed using the standard protocol following bolus administration of intravenous contrast. CONTRAST:  131mL OMNIPAQUE IOHEXOL 300 MG/ML  SOLN COMPARISON:  Abdominal ultrasound 09/25/2018. Abdominal CT 01/22/2009 FINDINGS: Lower chest: Tiny platelet subpleural opacity in the right lower lobe which was  faintly visualized on 2010 exam, considered benign. No basilar pulmonary nodule, effusion or focal airspace disease. The heart is normal in size. Hepatobiliary: The liver parenchyma is diffusely heterogeneous with suggestion of innumerable low-density lesions. Largest suspected lesion in the left lobe measures 12 mm, series 2, image 15. nondistended gallbladder. No calcified gallstone or pericholecystic fat stranding. Pancreas: 9 mm cyst in the proximal pancreatic body, series 2, image 25. No ductal dilatation or inflammation. Spleen: Normal in size.  12 mm low-density in the inferior spleen. Adrenals/Urinary Tract: No adrenal nodule. No hydronephrosis. There are multiple bilateral cysts within both kidneys. This includes a dominant exophytic cyst arising from the lower right kidney that measures 11.1 cm. There is no internal complexity. No solid renal lesions. Urinary bladder is partially distended, no obvious bladder abnormality. Stomach/Bowel: Detailed bowel assessment is limited in the absence  of enteric contrast as well as paucity of intra-abdominal fat. Stomach is decompressed further limiting assessment. There is no small bowel obstruction. No obvious small bowel inflammation the appendix is not discretely visualized moderate colonic stool burden. There is colonic redundancy. The splenic flexure of the colon is nondistended which limits assessment. Vascular/Lymphatic: Aortic atherosclerosis. No aortic aneurysm. Patent portal vein. No bulky abdominopelvic adenopathy, paucity of intra-abdominal fat limits assessment for adenopathy. Reproductive: Prominent prostate gland spanning 5.4 cm with nodular projection into the bladder base. Other: No ascites.  No free air.  No definite omental thickening. Musculoskeletal: Grade 1 anterolisthesis of L5 on S1. Unilateral left L5 pars defect. Slight heterogeneous appearance of the marrow but no discrete focal bone lesion. IMPRESSION: 1. Diffusely heterogeneous liver  parenchyma with suggestion of innumerable low-density lesions. Recommend further characterization with hepatic protocol MRI. This should only be performed if patient is able to tolerate breath hold technique. 2. Small cyst in the pancreas has benign CT features, 9 mm. Follow-up for a pancreatic cyst of this size in 5 years, if not further evaluated. 3. Multiple bilateral renal cysts. 4. Enlarged prostate gland with nodular projection into the bladder base. Recommend correlation with PSA. Aortic Atherosclerosis (ICD10-I70.0). Electronically Signed   By: Keith Rake M.D.   On: 06/30/2021 22:06   ECHOCARDIOGRAM COMPLETE  Result Date: 07/02/2021    ECHOCARDIOGRAM REPORT   Patient Name:   ANDRUE DINI Date of Exam: 07/02/2021 Medical Rec #:  [id number]      Height:       68.5 in Accession #:    [id number]     Weight:       154.0 lb Date of Birth:  1951-04-26      BSA:          1.839 m Patient Age:    60 years       BP:           117/74 mmHg Patient Gender: M              HR:           64 bpm. Exam Location:  Inpatient Procedure: 2D Echo, Cardiac Doppler and Color Doppler Indications:    Enocardits  History:        Patient has no prior history of Echocardiogram examinations.  Sonographer:    Merrie Roof RDCS Referring Phys: [id number]  1. Left ventricular ejection fraction, by estimation, is 55 to 60%. The left ventricle has normal function. The left ventricle has no regional wall motion abnormalities. Left ventricular diastolic parameters are consistent with Grade I diastolic dysfunction (impaired relaxation).  2. Right ventricular systolic function is normal. The right ventricular size is normal. There is normal pulmonary artery systolic pressure. The estimated right ventricular systolic pressure is 97.0 mmHg.  3. Left atrial size was mildly dilated.  4. The mitral valve is normal in structure. Trivial mitral valve regurgitation. No evidence of mitral stenosis.  5. The aortic valve is  tricuspid. Aortic valve regurgitation is not visualized. Mild aortic valve sclerosis is present, with no evidence of aortic valve stenosis.  6. The inferior vena cava is normal in size with greater than 50% respiratory variability, suggesting right atrial pressure of 3 mmHg.  7. No definite endocarditis visualized. The aortic valve is calcified but there is not a definite vegetation. If high suspicion, needs TEE. FINDINGS  Left Ventricle: Left ventricular ejection fraction, by estimation, is 55 to 60%. The left ventricle has normal function.  The left ventricle has no regional wall motion abnormalities. The left ventricular internal cavity size was normal in size. There is  no left ventricular hypertrophy. Left ventricular diastolic parameters are consistent with Grade I diastolic dysfunction (impaired relaxation). Right Ventricle: The right ventricular size is normal. No increase in right ventricular wall thickness. Right ventricular systolic function is normal. There is normal pulmonary artery systolic pressure. The tricuspid regurgitant velocity is 2.54 m/s, and  with an assumed right atrial pressure of 3 mmHg, the estimated right ventricular systolic pressure is 40.0 mmHg. Left Atrium: Left atrial size was mildly dilated. Right Atrium: Right atrial size was normal in size. Pericardium: There is no evidence of pericardial effusion. Mitral Valve: The mitral valve is normal in structure. Trivial mitral valve regurgitation. No evidence of mitral valve stenosis. Tricuspid Valve: The tricuspid valve is normal in structure. Tricuspid valve regurgitation is trivial. Aortic Valve: The aortic valve is tricuspid. Aortic valve regurgitation is not visualized. Mild aortic valve sclerosis is present, with no evidence of aortic valve stenosis. Aortic valve mean gradient measures 8.0 mmHg. Aortic valve peak gradient measures 16.2 mmHg. Aortic valve area, by VTI measures 1.75 cm. Pulmonic Valve: The pulmonic valve was normal in  structure. Pulmonic valve regurgitation is trivial. Aorta: The aortic root is normal in size and structure. Venous: The inferior vena cava is normal in size with greater than 50% respiratory variability, suggesting right atrial pressure of 3 mmHg. IAS/Shunts: No atrial level shunt detected by color flow Doppler.  LEFT VENTRICLE PLAX 2D LVIDd:         4.70 cm   Diastology LVIDs:         3.10 cm   LV e' medial:    9.02 cm/s LV PW:         1.00 cm   LV E/e' medial:  8.7 LV IVS:        0.70 cm   LV e' lateral:   15.30 cm/s LVOT diam:     2.00 cm   LV E/e' lateral: 5.1 LV SV:         71 LV SV Index:   39 LVOT Area:     3.14 cm  RIGHT VENTRICLE RV Basal diam:  2.80 cm LEFT ATRIUM           Index        RIGHT ATRIUM           Index LA diam:      3.30 cm 1.79 cm/m   RA Area:     13.40 cm LA Vol (A2C): 59.9 ml 32.57 ml/m  RA Volume:   25.80 ml  14.03 ml/m LA Vol (A4C): 21.0 ml 11.42 ml/m  AORTIC VALVE AV Area (Vmax):    1.71 cm AV Area (Vmean):   1.74 cm AV Area (VTI):     1.75 cm AV Vmax:           201.00 cm/s AV Vmean:          128.000 cm/s AV VTI:            0.406 m AV Peak Grad:      16.2 mmHg AV Mean Grad:      8.0 mmHg LVOT Vmax:         109.15 cm/s LVOT Vmean:        70.950 cm/s LVOT VTI:          0.226 m LVOT/AV VTI ratio: 0.56  AORTA Ao Root diam: 3.20 cm Ao Asc  diam:  3.50 cm MITRAL VALVE               TRICUSPID VALVE MV Area (PHT): 3.27 cm    TR Peak grad:   25.8 mmHg MV Decel Time: 232 msec    TR Vmax:        254.00 cm/s MV E velocity: 78.70 cm/s MV A velocity: 90.80 cm/s  SHUNTS MV E/A ratio:  0.87        Systemic VTI:  0.23 m                            Systemic Diam: 2.00 cm Dalton McleanMD Electronically signed by Franki Monte Signature Date/Time: 07/02/2021/3:37:58 PM    Final    US Abdomen Limited RUQ (LIVER/GB)  Result Date: 06/30/2021 CLINICAL DATA:  Liver mass on CT. EXAM: ULTRASOUND ABDOMEN LIMITED RIGHT UPPER QUADRANT COMPARISON:  CT abdomen pelvis 06/30/2021 FINDINGS: Gallbladder: No  gallstones or wall thickening visualized. No sonographic Murphy sign noted by sonographer. Common bile duct: Diameter: 4 mm. Liver: No focal hepatic lesion. Slightly heterogeneous parenchymal echogenicity. Portal vein is patent on color Doppler imaging with normal direction of blood flow towards the liver. Other: None. IMPRESSION: Slightly heterogeneous hepatic parenchyma with no sonographic finding of focal hepatic lesion. Recommend MRI liver protocol for further evaluation given CT abdomen pelvis 06/30/2021 findings. When the patient is clinically stable and able to follow directions and hold their breath (preferably as an outpatient) further evaluation with dedicated abdominal MRI should be considered. Electronically Signed   By: Iven Finn M.D.   On: 06/30/2021 23:41    Labs:  CBC: Recent Labs    07/01/21 1614 07/03/21 0918 07/04/21 1307 07/05/21 0718  WBC 17.3* 17.1* 11.6* 11.4*  HGB 13.2 11.9* 13.5 12.7*  HCT 39.8 36.2* 39.6 37.4*  PLT 246 246 242 259    COAGS: Recent Labs    07/01/21 1614 07/02/21 0252  INR 1.4* 1.7*    BMP: Recent Labs    07/02/21 0252 07/03/21 0918 07/04/21 1307 07/05/21 0718  NA 134* 137 136 136  K 3.8 3.2* 3.2* 3.3*  CL 104 105 102 105  CO2 22 26 26 23   GLUCOSE 103* 97 104* 103*  BUN 14 11 14 19   CALCIUM 8.4* 8.3* 8.5* 8.5*  CREATININE 1.44* 1.32* 1.28* 1.18  GFRNONAA 52* 58* 60* >60    LIVER FUNCTION TESTS: Recent Labs    06/30/21 1844 07/01/21 1614 07/03/21 0918 07/05/21 0718  BILITOT 0.5 0.9 0.8 0.9  AST 22 27 16  36  ALT 40 38 28 34  ALKPHOS 105 109 84 90  PROT 7.1 6.7 6.0* 5.8*  ALBUMIN 3.7 3.2* 2.6* 2.5*    TUMOR MARKERS: No results for input(s): AFPTM, CEA, CA199, CHROMGRNA in the last 8760 hours.  Assessment and Plan:  Liver lesions: Shane Lam, 70 year old male, is tentatively scheduled for 07/06/21 for an image-guided liver lesion biopsy. The procedure was discussed with the patient and his wife at the  bedside. Consent was signed by the patient's wife per patient request.   Risks and benefits of this procedure were discussed with the patient and/or patient's family including, but not limited to bleeding, infection, damage to adjacent structures or low yield requiring additional tests.  All of the questions were answered and there is agreement to proceed. He will be NPO at midnight. AM labs have been ordered. Lovenox will be held tonight.   Consent signed and in IR  Thank you for this interesting consult.  I greatly enjoyed meeting Shane Lam and look forward to participating in their care.  A copy of this report was sent to the requesting provider on this date.  Electronically Signed: Soyla Dryer, AGACNP-BC [phone] 07/05/2021, 9:43 AM   I spent a total of 20 Minutes    in face to face in clinical consultation, greater than 50% of which was counseling/coordinating care for liver lesion biopsy

## 2021-07-06 DIAGNOSIS — A419 Sepsis, unspecified organism: Secondary | ICD-10-CM | POA: Diagnosis not present

## 2021-07-06 DIAGNOSIS — R6520 Severe sepsis without septic shock: Secondary | ICD-10-CM | POA: Diagnosis not present

## 2021-07-06 LAB — COMPREHENSIVE METABOLIC PANEL
ALT: 28 U/L (ref 0–44)
AST: 22 U/L (ref 15–41)
Albumin: 2.4 g/dL — ABNORMAL LOW (ref 3.5–5.0)
Alkaline Phosphatase: 71 U/L (ref 38–126)
Anion gap: 7 (ref 5–15)
BUN: 19 mg/dL (ref 8–23)
CO2: 24 mmol/L (ref 22–32)
Calcium: 8.6 mg/dL — ABNORMAL LOW (ref 8.9–10.3)
Chloride: 106 mmol/L (ref 98–111)
Creatinine, Ser: 1.29 mg/dL — ABNORMAL HIGH (ref 0.61–1.24)
GFR, Estimated: 59 mL/min — ABNORMAL LOW (ref >60)
Glucose, Bld: 105 mg/dL — ABNORMAL HIGH (ref 70–99)
Potassium: 3.4 mmol/L — ABNORMAL LOW (ref 3.5–5.1)
Sodium: 137 mmol/L (ref 135–145)
Total Bilirubin: 1 mg/dL (ref 0.3–1.2)
Total Protein: 5.3 g/dL — ABNORMAL LOW (ref 6.5–8.1)

## 2021-07-06 LAB — CBC
HCT: 36.8 % — ABNORMAL LOW (ref 39.0–52.0)
HCT: 37.4 % — ABNORMAL LOW (ref 39.0–52.0)
Hemoglobin: 12.6 g/dL — ABNORMAL LOW (ref 13.0–17.0)
Hemoglobin: 12.8 g/dL — ABNORMAL LOW (ref 13.0–17.0)
MCH: 29.6 pg (ref 26.0–34.0)
MCH: 30 pg (ref 26.0–34.0)
MCHC: 34.2 g/dL (ref 30.0–36.0)
MCHC: 34.2 g/dL (ref 30.0–36.0)
MCV: 86.6 fL (ref 80.0–100.0)
MCV: 87.6 fL (ref 80.0–100.0)
Platelets: 261 10*3/uL (ref 150–400)
Platelets: 284 10*3/uL (ref 150–400)
RBC: 4.25 MIL/uL (ref 4.22–5.81)
RBC: 4.27 MIL/uL (ref 4.22–5.81)
RDW: 13 % (ref 11.5–15.5)
RDW: 13.2 % (ref 11.5–15.5)
WBC: 13.5 10*3/uL — ABNORMAL HIGH (ref 4.0–10.5)
WBC: 11.3 10*3/uL — ABNORMAL HIGH (ref 4.0–10.5)
nRBC: 0 % (ref 0.0–0.2)
nRBC: 0 % (ref 0.0–0.2)

## 2021-07-06 LAB — PROCALCITONIN: Procalcitonin: 1.16 ng/mL

## 2021-07-06 LAB — BASIC METABOLIC PANEL
Anion gap: 5 (ref 5–15)
BUN: 18 mg/dL (ref 8–23)
CO2: 24 mmol/L (ref 22–32)
Calcium: 8.4 mg/dL — ABNORMAL LOW (ref 8.9–10.3)
Chloride: 106 mmol/L (ref 98–111)
Creatinine, Ser: 1.07 mg/dL (ref 0.61–1.24)
GFR, Estimated: >60 mL/min (ref >60)
Glucose, Bld: 107 mg/dL — ABNORMAL HIGH (ref 70–99)
Potassium: 3.5 mmol/L (ref 3.5–5.1)
Sodium: 135 mmol/L (ref 135–145)

## 2021-07-06 LAB — CULTURE, BLOOD (ROUTINE X 2)
Culture: NO GROWTH
Culture: NO GROWTH
Special Requests: ADEQUATE
Special Requests: ADEQUATE

## 2021-07-06 LAB — AMMONIA: Ammonia: 13 umol/L (ref 9–35)

## 2021-07-06 LAB — PROTIME-INR
INR: 3.4 — ABNORMAL HIGH (ref 0.8–1.2)
Prothrombin Time: 34.5 seconds — ABNORMAL HIGH (ref 11.4–15.2)

## 2021-07-06 MED ORDER — POTASSIUM CHLORIDE CRYS ER 20 MEQ PO TBCR
20.0000 meq | EXTENDED_RELEASE_TABLET | ORAL | Status: AC
  Administered 2021-07-06: 20 meq via ORAL
  Filled 2021-07-06: qty 1

## 2021-07-06 MED ORDER — KATE FARMS STANDARD 1.4 PO LIQD
325.0000 mL | Freq: Two times a day (BID) | ORAL | Status: DC
  Administered 2021-07-07 – 2021-07-13 (×12): 325 mL via ORAL
  Filled 2021-07-06 (×17): qty 325

## 2021-07-06 MED ORDER — ADULT MULTIVITAMIN W/MINERALS CH
1.0000 | ORAL_TABLET | Freq: Every day | ORAL | Status: DC
  Administered 2021-07-06 – 2021-07-13 (×7): 1 via ORAL
  Filled 2021-07-06 (×8): qty 1

## 2021-07-06 MED ORDER — PHYTONADIONE 5 MG PO TABS
5.0000 mg | ORAL_TABLET | Freq: Every day | ORAL | Status: DC
  Administered 2021-07-06: 5 mg via ORAL
  Filled 2021-07-06 (×2): qty 1

## 2021-07-06 MED ORDER — FELODIPINE ER 5 MG PO TB24
5.0000 mg | ORAL_TABLET | Freq: Every day | ORAL | Status: DC
  Administered 2021-07-07 – 2021-07-08 (×2): 5 mg via ORAL
  Filled 2021-07-06 (×3): qty 1

## 2021-07-06 NOTE — Progress Notes (Signed)
PROGRESS NOTE  Shane Lam AOZ:[id number] DOB: 10/14/1950 DOA: 07/01/2021 PCP: [name], MD  HPI/Recap of past 24 hours: Shane Lam is a 70 y.o. male with medical history significant of HTN, HLD, chronic hypokalemia, who came with new onset of fever and generalized weakness and unintentional weight loss more than 25 pounds in the past several months.  Work-up revealed innumerable lesions in the liver seen on CT scan, an MRI was suggested which revealed findings worrisome for metastatic disease.  8 mm cyst in the head body junction region of the pancreas recommendation for follow-up MRI abdomen without and with contrast in 1 year.  CT scan 06/30/21 could not exclude the possibility of a partially circumferential rectal mass.  Recommended correlation with rectal exam.  Post flexible sigmoidoscopy on 07/03/2021 by Dr. [name], which was unrevealing.  IR consulted for possible liver lesions biopsies.  Plan for liver biopsy on 07/06/2021, however INR was elevated at 3.4, received 5 mg of vitamin K for goal INR of 1.5 prior to procedure.  07/06/2021: Patient was seen and examined at his bedside.  His wife was present in the room.  He is alert and oriented x3 but with slow mentation.  Patient had a fall overnight, denies hitting his head.  Bruising noted on the left lateral side of his back.  He denies having any pain.  Ammonia level obtained, normal.  No evidence of active infective process.  WBC downtrending, procalcitonin downtrending.   Assessment/Plan: Active Problems:   Sepsis ([place])  SIRS, no clear source of infection. WBC 17.3, T-max 102.4, respiration rate 30. No evidence of pneumonia on chest x-ray. No UTI on urinalysis. Blood cultures negative to date. MRSA screen negative. IV antibiotics discontinued on 07/04/2021, no evidence of active infective process. WBC and procalcitonin downtrending. Afebrile for the last 24 to 48 hours.  Innumerable lesions in the liver seen on CT scan/MRI  abdomen, worrisome for metastatic disease. GI consulted, appreciate assistance. LFTs are normal IR consulted for possible liver lesions biopsies. Plan follow liver biopsies on 07/06/2021 however was postponed due to coagulopathy.  Acute metabolic encephalopathy, suspect multifactorial secondary to acute illness, versus others Per his wife at bedside he is not at his baseline.  Mentation is slower than baseline. Ammonia level, CT head were unrevealing No evidence of active infective process. Reorient as needed Delirium precautions Fall precautions  Coagulopathy in the setting of innumerable lesions in the liver seen on CT scan INR level this morning 3.4, from 1.4 on admission 5 days ago. LFTs, ammonia level within normal limits Vitamin K 5 mg daily x2 days ordered for goal INR of 1.5 prior to procedure. Obtain INR in the morning.  Mild acute metabolic encephalopathy suspect multifactorial, secondary to acute illness, iatrogenic from possibly Phenergan Reorient as needed Monitor closely  Refractory hypokalemia, repleted Home spironolactone 25 mg daily and home irbesartan 75 mg daily restarted Continue home potassium supplement 20 mEq twice daily. Additional dose of p.o. potassium 20 mg x 1 Monitor serum potassium level.  Bradycardia Heart rate in the 40s to 50s Hold off home felodipine to avoid worsening bradycardia  Essential hypertension BP is not at goal Elevated Continue home oral antihypertensives except felodipine Closely monitor vital signs  Rectal mass, ruled out on flex sigmoidoscopy. Planned flexible sigmoidoscopy completed on 07/03/2021 by Dr. [name], no acute findings.  Acute blood loss anemia, unclear etiology Hemoglobin dropped 11.9 from 13.2. No overt bleeding Continue to closely monitor.  Resolved mild hypovolemic hyponatremia Serum sodium 134>> 136. Continue  IV fluid hydration normal saline at 75 cc/h  CKD 2 He appears to be at his baseline creatinine  1.2 with GFR of 60. Continue to monitor for toxic agents, dehydration and hypotension.  Continue to avoid nephrotoxic agents, dehydration and hypotension.  Continue gentle IV fluid hydration. No urine output recorded Repeat BMP in the morning.  Hyperlipidemia LFTs are within normal limits. Holding off home Crestor 5 mg daily due to liver lesions.  Generalized weakness Seen by physical therapy and Occupational Therapy. Continue to mobilize as tolerated.  Moderate protein calorie malnutrition BMI 23 Albumin 2.4 Moderate muscle mass loss Dietitian consult  Critical care time: 65 minutes.   Code Status: Full code  Family Communication: Updated his wife at bedside.  Disposition Plan: Likely will discharge to home once GI and IR sign off.     Consultants: GI  Procedures: None  Antimicrobials: IV vancomycin, DC'd on 07/02/2021. IV Zosyn, DC'd on 07/04/2021.  DVT prophylaxis: Subcu Lovenox daily  Status is: Inpatient  Patient will require at least 2 midnights for further evaluation and treatment of present condition.      Objective: Vitals:   07/06/21 0001 07/06/21 0232 07/06/21 0530 07/06/21 0737  BP: (!) 152/96 (!) 130/98 (!) 138/98 (!) 128/100  Pulse:  84 84 67  Resp:  16 17 16   Temp:  98.1 F (36.7 C) 98.1 F (36.7 C) 97.8 F (36.6 C)  TempSrc:  Oral Oral Oral  SpO2:  100% 100% 100%  Weight:      Height:        Intake/Output Summary (Last 24 hours) at 07/06/2021 1522 Last data filed at 07/05/2021 1542 Gross per 24 hour  Intake 120 ml  Output --  Net 120 ml   Filed Weights   07/05/21 1911  Weight: 69.9 kg    Exam:  General: 70 y.o. year-old male frail-appearing in no acute distress.  He is alert and oriented x3 however his mentation is slow compared to his baseline.   Cardiovascular: Regular rate and rhythm no rubs or gallops. Respiratory: Clear to auscultation with no wheezes or rales.  Abdomen: Soft nontender normal bowel sounds present.   Musculoskeletal: No lower extremity edema bilaterally.   Skin: No ulcerative lesions noted.  Mild bruising noted at left lateral mid back. Psychiatry: Mood is appropriate for condition and setting. Neuro: Alert and awake, moves all 4 extremities equally.  Data Reviewed: CBC: Recent Labs  Lab 07/01/21 1614 07/03/21 0918 07/04/21 1307 07/05/21 0718 07/06/21 0038 07/06/21 0838  WBC 17.3* 17.1* 11.6* 11.4* 13.5* 11.3*  NEUTROABS 15.0*  --   --   --   --   --   HGB 13.2 11.9* 13.5 12.7* 12.6* 12.8*  HCT 39.8 36.2* 39.6 37.4* 36.8* 37.4*  MCV 89.4 89.8 87.2 87.6 86.6 87.6  PLT 246 246 242 259 261 161   Basic Metabolic Panel: Recent Labs  Lab 07/03/21 0918 07/04/21 1307 07/05/21 0718 07/06/21 0616 07/06/21 0838  NA 137 136 136 135 137  K 3.2* 3.2* 3.3* 3.5 3.4*  CL 105 102 105 106 106  CO2 26 26 23 24 24   GLUCOSE 97 104* 103* 107* 105*  BUN 11 14 19 18 19   CREATININE 1.32* 1.28* 1.18 1.07 1.29*  CALCIUM 8.3* 8.5* 8.5* 8.4* 8.6*  MG 2.2  --  2.2  --   --   PHOS 3.0  --  2.5  --   --    GFR: Estimated Creatinine Clearance: 51.7 mL/min (A) (by C-G formula  based on SCr of 1.29 mg/dL (H)). Liver Function Tests: Recent Labs  Lab 06/30/21 1844 07/01/21 1614 07/03/21 0918 07/05/21 0718 07/06/21 0838  AST 22 27 16  36 22  ALT 40 38 28 34 28  ALKPHOS 105 109 84 90 71  BILITOT 0.5 0.9 0.8 0.9 1.0  PROT 7.1 6.7 6.0* 5.8* 5.3*  ALBUMIN 3.7 3.2* 2.6* 2.5* 2.4*   Recent Labs  Lab 07/01/21 1626  LIPASE 25   Recent Labs  Lab 07/01/21 1744 07/06/21 0838  AMMONIA 26 13   Coagulation Profile: Recent Labs  Lab 07/01/21 1614 07/02/21 0252 07/06/21 0038  INR 1.4* 1.7* 3.4*   Cardiac Enzymes: No results for input(s): CKTOTAL, CKMB, CKMBINDEX, TROPONINI in the last 168 hours. BNP (last 3 results) No results for input(s): PROBNP in the last 8760 hours. HbA1C: Recent Labs    07/05/21 0718  HGBA1C 5.6   CBG: No results for input(s): GLUCAP in the last 168  hours. Lipid Profile: Recent Labs    07/05/21 0718  CHOL 101  HDL 18*  LDLCALC 68  TRIG 75  CHOLHDL 5.6   Thyroid Function Tests: No results for input(s): TSH, T4TOTAL, FREET4, T3FREE, THYROIDAB in the last 72 hours. Anemia Panel: No results for input(s): VITAMINB12, FOLATE, FERRITIN, TIBC, IRON, RETICCTPCT in the last 72 hours. Urine analysis:    Component Value Date/Time   COLORURINE COLORLESS (A) 07/01/2021 1941   APPEARANCEUR CLEAR 07/01/2021 1941   LABSPEC 1.003 (L) 07/01/2021 1941   PHURINE 6.0 07/01/2021 1941   GLUCOSEU NEGATIVE 07/01/2021 1941   HGBUR SMALL (A) 07/01/2021 1941   BILIRUBINUR NEGATIVE 07/01/2021 1941   KETONESUR NEGATIVE 07/01/2021 1941   PROTEINUR NEGATIVE 07/01/2021 1941   NITRITE NEGATIVE 07/01/2021 1941   LEUKOCYTESUR NEGATIVE 07/01/2021 1941   Sepsis Labs: @LABRCNTIP (procalcitonin:4,lacticidven:4)  ) Recent Results (from the past 240 hour(s))  SARS CORONAVIRUS 2 (TAT 6-24 HRS) Nasopharyngeal Nasopharyngeal Swab     Status: None   Collection Time: 06/27/21  6:37 PM   Specimen: Nasopharyngeal Swab  Result Value Ref Range Status   SARS Coronavirus 2 NEGATIVE NEGATIVE Final    Comment: (NOTE) SARS-CoV-2 target nucleic acids are NOT DETECTED.  The SARS-CoV-2 RNA is generally detectable in upper and lower respiratory specimens during the acute phase of infection. Negative results do not preclude SARS-CoV-2 infection, do not rule out co-infections with other pathogens, and should not be used as the sole basis for treatment or other patient management decisions. Negative results must be combined with clinical observations, patient history, and epidemiological information. The expected result is Negative.  Fact Sheet for Patients: SugarRoll.be  Fact Sheet for Healthcare Providers: https://www.woods-mathews.com/  This test is not yet approved or cleared by the [place] FDA and  has been  authorized for detection and/or diagnosis of SARS-CoV-2 by FDA under an Emergency Use Authorization (EUA). This EUA will remain  in effect (meaning this test can be used) for the duration of the COVID-19 declaration under Se ction 564(b)(1) of the Act, 21 U.S.C. section 360bbb-3(b)(1), unless the authorization is terminated or revoked sooner.  Performed at [facility], [place] [address], [place], [place] [address]   Respiratory (~20 pathogens) panel by PCR     Status: None   Collection Time: 06/27/21  6:46 PM   Specimen: Nasopharyngeal Swab; Respiratory  Result Value Ref Range Status   Adenovirus NOT DETECTED NOT DETECTED Final   Coronavirus 229E NOT DETECTED NOT DETECTED Final    Comment: (NOTE) The Coronavirus on the  Respiratory Panel, DOES NOT test for the novel  Coronavirus (2019 nCoV)    Coronavirus HKU1 NOT DETECTED NOT DETECTED Final   Coronavirus NL63 NOT DETECTED NOT DETECTED Final   Coronavirus OC43 NOT DETECTED NOT DETECTED Final   Metapneumovirus NOT DETECTED NOT DETECTED Final   Rhinovirus / Enterovirus NOT DETECTED NOT DETECTED Final   Influenza A NOT DETECTED NOT DETECTED Final   Influenza B NOT DETECTED NOT DETECTED Final   Parainfluenza Virus 1 NOT DETECTED NOT DETECTED Final   Parainfluenza Virus 2 NOT DETECTED NOT DETECTED Final   Parainfluenza Virus 3 NOT DETECTED NOT DETECTED Final   Parainfluenza Virus 4 NOT DETECTED NOT DETECTED Final   Respiratory Syncytial Virus NOT DETECTED NOT DETECTED Final   Bordetella pertussis NOT DETECTED NOT DETECTED Final   Bordetella Parapertussis NOT DETECTED NOT DETECTED Final   Chlamydophila pneumoniae NOT DETECTED NOT DETECTED Final   Mycoplasma pneumoniae NOT DETECTED NOT DETECTED Final    Comment: Performed at [facility], [place] [address] [place], [place] [address]  Resp Panel by RT-PCR (Flu A&B, Covid) Nasopharyngeal Swab     Status: None   Collection Time: 06/30/21  6:45 PM   Specimen: Nasopharyngeal Swab;  Nasopharyngeal(NP) swabs in vial transport medium  Result Value Ref Range Status   SARS Coronavirus 2 by RT PCR NEGATIVE NEGATIVE Final    Comment: (NOTE) SARS-CoV-2 target nucleic acids are NOT DETECTED.  The SARS-CoV-2 RNA is generally detectable in upper respiratory specimens during the acute phase of infection. The lowest concentration of SARS-CoV-2 viral copies this assay can detect is 138 copies/mL. A negative result does not preclude SARS-Cov-2 infection and should not be used as the sole basis for treatment or other patient management decisions. A negative result may occur with  improper specimen collection/handling, submission of specimen other than nasopharyngeal swab, presence of viral mutation(s) within the areas targeted by this assay, and inadequate number of viral copies(<138 copies/mL). A negative result must be combined with clinical observations, patient history, and epidemiological information. The expected result is Negative.  Fact Sheet for Patients:  EntrepreneurPulse.com.au  Fact Sheet for Healthcare Providers:  IncredibleEmployment.be  This test is no t yet approved or cleared by the [place] FDA and  has been authorized for detection and/or diagnosis of SARS-CoV-2 by FDA under an Emergency Use Authorization (EUA). This EUA will remain  in effect (meaning this test can be used) for the duration of the COVID-19 declaration under Section 564(b)(1) of the Act, 21 U.S.C.section 360bbb-3(b)(1), unless the authorization is terminated  or revoked sooner.       Influenza A by PCR NEGATIVE NEGATIVE Final   Influenza B by PCR NEGATIVE NEGATIVE Final    Comment: (NOTE) The Xpert Xpress SARS-CoV-2/FLU/RSV plus assay is intended as an aid in the diagnosis of influenza from Nasopharyngeal swab specimens and should not be used as a sole basis for treatment. Nasal washings and aspirates are unacceptable for Xpert Xpress  SARS-CoV-2/FLU/RSV testing.  Fact Sheet for Patients: EntrepreneurPulse.com.au  Fact Sheet for Healthcare Providers: IncredibleEmployment.be  This test is not yet approved or cleared by the [place] FDA and has been authorized for detection and/or diagnosis of SARS-CoV-2 by FDA under an Emergency Use Authorization (EUA). This EUA will remain in effect (meaning this test can be used) for the duration of the COVID-19 declaration under Section 564(b)(1) of the Act, 21 U.S.C. section 360bbb-3(b)(1), unless the authorization is terminated or revoked.  Performed at [facility], [place]  [place], [place], [place] [address]   Blood culture (routine x 2)     Status: None   Collection Time: 06/30/21  9:20 PM   Specimen: BLOOD  Result Value Ref Range Status   Specimen Description   Final    BLOOD BOTTLES DRAWN AEROBIC AND ANAEROBIC Performed at [facility], [address], [place], [place] [address]    Special Requests   Final    Blood Culture adequate volume LEFT ANTECUBITAL Performed at [facility], [address] [place], [place] [address]    Culture   Final    NO GROWTH 5 DAYS Performed at [facility], [place] [address], [place], [place] [address]    Report Status 07/06/2021 FINAL  Final  Blood culture (routine x 2)     Status: None   Collection Time: 06/30/21  9:30 PM   Specimen: BLOOD  Result Value Ref Range Status   Specimen Description   Final    BLOOD BOTTLES DRAWN AEROBIC AND ANAEROBIC Performed at [facility], [address] [place], [place] [address]    Special Requests   Final    Blood Culture adequate volume RIGHT ANTECUBITAL Performed at [facility], [address] [place], [place] [address]    Culture   Final    NO GROWTH 5 DAYS Performed at [facility], [place] [address] [place], [place] [address]     Report Status 07/06/2021 FINAL  Final  MRSA Next Gen by PCR, Nasal     Status: None   Collection Time: 07/02/21  3:24 PM   Specimen: Nasal Mucosa; Nasal Swab  Result Value Ref Range Status   MRSA by PCR Next Gen NOT DETECTED NOT DETECTED Final    Comment: (NOTE) The GeneXpert MRSA Assay (FDA approved for NASAL specimens only), is one component of a comprehensive MRSA colonization surveillance program. It is not intended to diagnose MRSA infection nor to guide or monitor treatment for MRSA infections. Test performance is not FDA approved in patients less than 59 years old. Performed at [facility], [place] [address] [place],  [address]       Studies: No results found.  Scheduled Meds:  felodipine  5 mg Oral Daily   irbesartan  75 mg Oral Daily   phytonadione  5 mg Oral Daily   potassium chloride SA  20 mEq Oral BID   spironolactone  25 mg Oral Daily    Continuous Infusions:     LOS: 5 days     Kayleen Memos, MD [facility] Pager [phone]  If 7PM-7AM, please contact night-coverage [email or web address] Password [facility] 07/06/2021, 3:22 PM

## 2021-07-06 NOTE — Progress Notes (Signed)
Pt found on floor next to bed by wife who was at bedside at the time of fall but did not witness. Pt was found sitting upright on the floor against the wall, VSS, pt able to answer questions appropriately, denies pain, no evidence of head injury or trauma, fall did result in bruise and small abrasion to center of back, ice applied, MD notified, CN notified, Safe zone and post fall assessment completed, bed alarm activated and audible, floor mats placed, family educated on unit safety protocols and verbalized an understanding. Wife of pt encouraged to call staff at anytime for pt needs and request. Bed in low position, call bell in reach

## 2021-07-06 NOTE — Progress Notes (Signed)
Physical Therapy Treatment Patient Details Name: Shane Lam MRN: [id number] DOB: 1951/02/13 Today's Date: 07/06/2021   History of Present Illness Shane Lam is a 70 y.o. male with medical history significant of HTN, BPH, HLD came with new onset of fever and generalized weakness and weight loss; Sepsis; Imaging reveals metastatic liver Ca    PT Comments    Continuing work on functional mobility and activity tolerance;  session focused on Berg Balance Assessment today, especially in light of last night's fall; Pt scored a 41/56 on the Berg, which is indicative of high fall risk; He had the most difficulty with tasks that involved decr base of support, so an assistive device that can effectively increace his base of support is in order; at this time, recommend RW;     07/06/21 1200  Standardized Balance Assessment  Standardized Balance Assessment  Berg Balance Test  Berg Balance Test  Sit to Stand 4  Standing Unsupported 3  Sitting with Back Unsupported but Feet Supported on Floor or Stool 4  Stand to Sit 3  Transfers 4  Standing Unsupported with Eyes Closed 3  Standing Ubsupported with Feet Together 3  From Standing, Reach Forward with Outstretched Arm 4  From Standing Position, Pick up Object from Floor 4  From Standing Position, Turn to Look Behind Over each Shoulder 3  Turn 360 Degrees 2  Standing Unsupported, Alternately Place Feet on Step/Stool 1  Standing Unsupported, One Foot in Front 2  Standing on One Leg 1  Total Score 41    Recommendations for follow up therapy are one component of a multi-disciplinary discharge planning process, led by the attending physician.  Recommendations may be updated based on patient status, additional functional criteria and insurance authorization.  Follow Up Recommendations  Home health PT     Assistance Recommended at Discharge Frequent or constant Supervision/Assistance  Equipment Recommendations  Rolling walker (2  wheels);BSC/3in1    Recommendations for Other Services       Precautions / Restrictions Precautions Precautions: Fall     Mobility  Bed Mobility Overal bed mobility: Needs Assistance Bed Mobility: Supine to Sit     Supine to sit: Supervision     General bed mobility comments: Supervision for safety    Transfers Overall transfer level: Needs assistance Equipment used: None Transfers: Sit to/from Stand Sit to Stand: Min guard           General transfer comment: stood impulsively from EOB; unsteady at intial stand, but good rise    Ambulation/Gait Ambulation/Gait assistance: Min guard Gait Distance (Feet): 25 Feet Assistive device: None;Rolling walker (2 wheels) Gait Pattern/deviations: Step-through pattern;Drifts right/left (rending to drift R)       General Gait Details: Unsteadiness walking without UE support with a few small losses of balance   Stairs             Wheelchair Mobility    Modified Rankin (Stroke Patients Only)       Balance     Sitting balance-Leahy Scale: Good     Standing balance support: During functional activity Standing balance-Leahy Scale: Fair                              Cognition Arousal/Alertness: Awake/alert Behavior During Therapy: Impulsive Overall Cognitive Status: Impaired/Different from baseline Area of Impairment: Safety/judgement;Awareness;Memory                     Memory: Decreased short-term  memory   Safety/Judgement: Decreased awareness of safety;Decreased awareness of deficits Awareness: Emergent   General Comments: Remains impulsive with lack of awareness of deficits.        Exercises      General Comments        Pertinent Vitals/Pain Pain Assessment: No/denies pain    Home Living                          Prior Function            PT Goals (current goals can now be found in the care plan section) Acute Rehab PT Goals Patient Stated Goal: Did  not state, but agreeable to amb PT Goal Formulation: With patient Time For Goal Achievement: 07/18/21 Potential to Achieve Goals: Good Progress towards PT goals: Progressing toward goals    Frequency    Min 3X/week      PT Plan Current plan remains appropriate    Co-evaluation              AM-PAC PT "6 Clicks" Mobility   Outcome Measure  Help needed turning from your back to your side while in a flat bed without using bedrails?: None Help needed moving from lying on your back to sitting on the side of a flat bed without using bedrails?: A Little Help needed moving to and from a bed to a chair (including a wheelchair)?: A Little Help needed standing up from a chair using your arms (e.g., wheelchair or bedside chair)?: A Little Help needed to walk in hospital room?: A Lot Help needed climbing 3-5 steps with a railing? : A Lot 6 Click Score: 17    End of Session Equipment Utilized During Treatment: Gait belt Activity Tolerance: Patient tolerated treatment well;Patient limited by fatigue Patient left: in chair;with call bell/phone within reach;with chair alarm set;with family/visitor present Nurse Communication: Mobility status PT Visit Diagnosis: Unsteadiness on feet (R26.81);Other abnormalities of gait and mobility (R26.89)     Time: 1694-5038 PT Time Calculation (min) (ACUTE ONLY): 36 min  Charges:  $Therapeutic Activity: 23-37 mins                     Roney Marion, PT  Acute Rehabilitation Services Pager [phone] Office [phone]    [place] 07/06/2021, 5:26 PM

## 2021-07-06 NOTE — Progress Notes (Signed)
Initial Nutrition Assessment  DOCUMENTATION CODES:   Severe malnutrition in context of chronic illness  INTERVENTION:   Multivitamin w/ minerals daily  [employer] PO BID, each supplement provides 455 kcal and 20 grams of protein  Encourage good PO intake  NUTRITION DIAGNOSIS:   Severe Malnutrition related to chronic illness (possible liver cancer) as evidenced by severe fat depletion, moderate muscle depletion, percent weight loss.  GOAL:   Patient will meet greater than or equal to 90% of their needs  MONITOR:   PO intake, Supplement acceptance, Weight trends, Labs, I & O's  REASON FOR ASSESSMENT:   Consult Assessment of nutrition requirement/status  ASSESSMENT:   70 y.o. male presented to the ED with fever, weakness, and fatigue. PMH includes HTN. Pt admitted with sepsis and with possible metastatic liver cancer.   11/4 - Flexible Sigmoidoscopy  Pt sleeping at time of visit, pt's wife and son at bedside and are able to provide nutrition related information.   Per wife, pt has been following Dr. Quay Burow diet for the most part. Per wife, they have been following this diet since March due to high cholesterol levels. Wife reports that his appetite was great prior to admission and that he eats well if it is something that he likes. Wife reports that he will not eat anything if he does not like it, even with her cooking. Reports a typical intake of; Breakfast: potatoes, egg, oats, fruit and nuts; Lunch: salad and vegetables; Dinner: chicken or fish and vegetables. Wife reports that she has been restricting his diet to help him. Reports that he really like seafood and sweets and no longer has those things. Pt wife reports that they drink a shake with flax and chia seeds but was unable to recall the name of the shake.  Per EMR, pt intake includes 100% for dinner on 11/4 and 75% for dinner on 11/6.  Wife reports that he use to weight ~200# a year ago and his doctor told him that he  was obese and he started to lose weight intentionally. Pt got to 180# within 2-4 months and was happy. In August wife notice that he started loosing even more weight. Per EMR, pt has had a 8% weight loss in 3 months, which is clinically significant for time frame. Wife reports that they use to be very active and would walk 5 miles per day. They currently do not walk that much but are still active.  Discussed ONS with pt family; pt does not do soy or dairy products. Will provide pt with [employer] supplement.  Planned for liver biopsy in IR on 11/8.  Medications reviewed and include: Vitamin K, Potassium Chloride, Spironolactone  Labs reviewed: Potassium 3.4  NUTRITION - FOCUSED PHYSICAL EXAM:  Flowsheet Row Most Recent Value  Orbital Region Severe depletion  Upper Arm Region Moderate depletion  Thoracic and Lumbar Region Moderate depletion  Buccal Region Severe depletion  Temple Region Severe depletion  Clavicle Bone Region Moderate depletion  Clavicle and Acromion Bone Region Moderate depletion  Scapular Bone Region Moderate depletion  Dorsal Hand Moderate depletion  Patellar Region Mild depletion  [unable to fully bend legs]  Anterior Thigh Region Mild depletion  Posterior Calf Region Mild depletion  Edema (RD Assessment) None  Hair Reviewed  Eyes Unable to assess  [Pt sleeping]  Mouth Unable to assess  [Pt sleeping]  Skin Reviewed  Nails Reviewed       Diet Order:   Diet Order  Diet NPO time specified Except for: Sips with Meds  Diet effective midnight           Diet regular Room service appropriate? Yes; Fluid consistency: Thin  Diet effective now                   EDUCATION NEEDS:   No education needs have been identified at this time  Skin:  Skin Assessment: Reviewed RN Assessment  Last BM:  07/06/2021  Height:   Ht Readings from Last 1 Encounters:  07/05/21 5' 8.5" (1.74 m)    Weight:   Wt Readings from Last 1 Encounters:  07/05/21  69.9 kg    Ideal Body Weight:  70 kg  BMI:  Body mass index is 23.09 kg/m.  Estimated Nutritional Needs:   Kcal:  2100-2300  Protein:  105-120 grams  Fluid:  > 2 L    Mandy Fitzwater BS, PLDN Clinical Dietitian See AMiON for contact information.

## 2021-07-06 NOTE — Care Management Important Message (Signed)
Important Message  Patient Details  Name: Shane Lam MRN: [id number] Date of Birth: [birth year]   Medicare Important Message Given:  Yes     [name] 07/06/2021, 1:22 PM

## 2021-07-06 NOTE — TOC Initial Note (Signed)
Transition of Care [facility]) - Initial/Assessment Note    Patient Details  Name: Shane Lam MRN: [id number] Date of Birth: [birth year]  Transition of Care [facility]) CM/SW Contact:    Marilu Favre, RN Phone Number: 07/06/2021, 3:10 PM  Clinical Narrative:                 Patient from home with wife.   PT recommended HHPT, wife in agreement.   Patient has a walker at home that belonged to his mother, he can use that. Wife states they will need 3 in 1 . Will order 3 in1 closer to discharge.   Hoyle Sauer with [facility] accepted referral for HHPT   Expected Discharge Plan: [place]     Patient Goals and CMS Choice Patient states their goals for this hospitalization and ongoing recovery are:: to return to home CMS Medicare.gov Compare Post Acute Care list provided to:: Patient Choice offered to / list presented to : Patient, Spouse, Adult Children  Expected Discharge Plan and Services Expected Discharge Plan: [place]   Discharge Planning Services: CM Consult   Living arrangements for the past 2 months: Single Family Home                 DME Arranged: 3-N-1 DME Agency: [employer]       HH Arranged: PT          Prior Living Arrangements/Services Living arrangements for the past 2 months: Single Family Home Lives with:: Spouse Patient language and need for interpreter reviewed:: Yes Do you feel safe going back to the place where you live?: Yes      Need for Family Participation in Patient Care: Yes (Comment) Care giver support system in place?: Yes (comment) Current home services: DME Criminal Activity/Legal Involvement Pertinent to Current Situation/Hospitalization: No - Comment as needed  Activities of Daily Living Home Assistive Devices/Equipment: None ADL Screening (condition at time of admission) Patient's cognitive ability adequate to safely complete daily activities?: Yes Is the patient deaf or have difficulty  hearing?: No Does the patient have difficulty seeing, even when wearing glasses/contacts?: No Does the patient have difficulty concentrating, remembering, or making decisions?: No Patient able to express need for assistance with ADLs?: Yes Does the patient have difficulty dressing or bathing?: Yes Independently performs ADLs?: No Communication: Independent Dressing (OT): Needs assistance Is this a change from baseline?: Pre-admission baseline Grooming: Needs assistance Does the patient have difficulty walking or climbing stairs?: No Weakness of Legs: Both Weakness of Arms/Hands: Both  Permission Sought/Granted   Permission granted to share information with : Yes, Verbal Permission Granted  Share Information with NAME: wife           Emotional Assessment Appearance:: Appears stated age Attitude/Demeanor/Rapport: Engaged Affect (typically observed): Accepting Orientation: : Oriented to Self, Oriented to Place, Oriented to  Time, Oriented to Situation Alcohol / Substance Use: Not Applicable Psych Involvement: No (comment)  Admission diagnosis:  Liver lesion [K76.9] Sepsis ([place]) [A41.9] Sepsis, due to unspecified organism, unspecified whether acute organ dysfunction present [facility]) [A41.9] Patient Active Problem List   Diagnosis Date Noted   Sepsis ([place]) 07/01/2021   PCP:  [name], MD Pharmacy:   [id number] - [place], [place] [address] [place] [address] Phone: [phone] Fax: [phone]     Social Determinants of Health (SDOH) Interventions    Readmission Risk Interventions No flowsheet data found.

## 2021-07-07 DIAGNOSIS — A419 Sepsis, unspecified organism: Secondary | ICD-10-CM | POA: Diagnosis not present

## 2021-07-07 DIAGNOSIS — R6520 Severe sepsis without septic shock: Secondary | ICD-10-CM | POA: Diagnosis not present

## 2021-07-07 LAB — PROTIME-INR
INR: 2.9 — ABNORMAL HIGH (ref 0.8–1.2)
INR: 3.2 — ABNORMAL HIGH (ref 0.8–1.2)
Prothrombin Time: 30.7 seconds — ABNORMAL HIGH (ref 11.4–15.2)
Prothrombin Time: 32.8 seconds — ABNORMAL HIGH (ref 11.4–15.2)

## 2021-07-07 LAB — CBC
HCT: 35.5 % — ABNORMAL LOW (ref 39.0–52.0)
Hemoglobin: 11.8 g/dL — ABNORMAL LOW (ref 13.0–17.0)
MCH: 29.2 pg (ref 26.0–34.0)
MCHC: 33.2 g/dL (ref 30.0–36.0)
MCV: 87.9 fL (ref 80.0–100.0)
Platelets: 306 10*3/uL (ref 150–400)
RBC: 4.04 MIL/uL — ABNORMAL LOW (ref 4.22–5.81)
RDW: 13.3 % (ref 11.5–15.5)
WBC: 10.8 10*3/uL — ABNORMAL HIGH (ref 4.0–10.5)
nRBC: 0 % (ref 0.0–0.2)

## 2021-07-07 LAB — BASIC METABOLIC PANEL
Anion gap: 5 (ref 5–15)
BUN: 21 mg/dL (ref 8–23)
CO2: 22 mmol/L (ref 22–32)
Calcium: 8.6 mg/dL — ABNORMAL LOW (ref 8.9–10.3)
Chloride: 109 mmol/L (ref 98–111)
Creatinine, Ser: 1.18 mg/dL (ref 0.61–1.24)
GFR, Estimated: >60 mL/min (ref >60)
Glucose, Bld: 102 mg/dL — ABNORMAL HIGH (ref 70–99)
Potassium: 3.8 mmol/L (ref 3.5–5.1)
Sodium: 136 mmol/L (ref 135–145)

## 2021-07-07 LAB — TSH: TSH: 1.799 u[IU]/mL (ref 0.350–4.500)

## 2021-07-07 LAB — VITAMIN B12: Vitamin B-12: 801 pg/mL (ref 180–914)

## 2021-07-07 LAB — PROCALCITONIN: Procalcitonin: 0.81 ng/mL

## 2021-07-07 MED ORDER — PHYTONADIONE 5 MG PO TABS
10.0000 mg | ORAL_TABLET | Freq: Every day | ORAL | Status: AC
  Administered 2021-07-07 – 2021-07-08 (×2): 10 mg via ORAL
  Filled 2021-07-07 (×4): qty 2

## 2021-07-07 MED ORDER — LACTATED RINGERS IV SOLN: INTRAVENOUS | Status: DC

## 2021-07-07 MED ORDER — PHYTONADIONE 5 MG PO TABS
10.0000 mg | ORAL_TABLET | ORAL | Status: AC
  Administered 2021-07-07: 10 mg via ORAL
  Filled 2021-07-07: qty 2

## 2021-07-07 NOTE — Progress Notes (Signed)
Occupational Therapy Treatment Patient Details Name: Shane Lam MRN: [id number] DOB: 07-02-1951 Today's Date: 07/07/2021   History of present illness Shane Lam is a 70 y.o. male with medical history significant of HTN, BPH, HLD came with new onset of fever and generalized weakness and weight loss; Sepsis; Imaging reveals metastatic liver Ca   OT comments  Pt is progressing towards OT goals. Remains limited by balance and cognition. During session, pt completed grooming tasks including shaving while standing at sink with supervision. Noted to have improved balance today. Continues to exhibit decreased safety awareness and insight of deficits. Will continue to follow acutely.   Recommendations for follow up therapy are one component of a multi-disciplinary discharge planning process, led by the attending physician.  Recommendations may be updated based on patient status, additional functional criteria and insurance authorization.    Follow Up Recommendations  No OT follow up    Assistance Recommended at Discharge Intermittent Supervision/Assistance  Equipment Recommendations  BSC/3in1    Recommendations for Other Services      Precautions / Restrictions Precautions Precautions: Fall Restrictions Weight Bearing Restrictions: No       Mobility Bed Mobility     Transfers Overall transfer level: Needs assistance Equipment used: Rolling walker (2 wheels) Transfers: Sit to/from Stand Sit to Stand: Min guard           General transfer comment: Cues for hand placement     Balance Overall balance assessment: Needs assistance Sitting-balance support: No upper extremity supported;Feet supported Sitting balance-Leahy Scale: Good     Standing balance support: During functional activity Standing balance-Leahy Scale: Fair Standing balance comment: Noted more steady with standing activity today, less swaying                           ADL either performed or  assessed with clinical judgement   ADL Overall ADL's : Needs assistance/impaired     Grooming: Wash/dry face;Oral care;Supervision/safety;Standing (And shaving) Grooming Details (indicate cue type and reason): At sink.                             Functional mobility during ADLs: Min guard;Rolling walker (2 wheels)      Extremity/Trunk Assessment              Vision       Perception     Praxis      Cognition Arousal/Alertness: Awake/alert Behavior During Therapy: WFL for tasks assessed/performed Overall Cognitive Status: Impaired/Different from baseline Area of Impairment: Safety/judgement;Awareness;Memory                     Memory: Decreased short-term memory   Safety/Judgement: Decreased awareness of safety;Decreased awareness of deficits Awareness: Emergent   General Comments: When told his balance was improved today did not remember ever having impaired balance          Exercises     Shoulder Instructions       General Comments HR increase to 145 bpm during grooming while standing at sink. Down to 98 after returning to sitting.    Pertinent Vitals/ Pain       Pain Assessment: No/denies pain  Home Living  Prior Functioning/Environment              Frequency  Min 2X/week        Progress Toward Goals  OT Goals(current goals can now be found in the care plan section)  Progress towards OT goals: Progressing toward goals  Acute Rehab OT Goals Patient Stated Goal: return home OT Goal Formulation: With patient Time For Goal Achievement: 07/18/21 Potential to Achieve Goals: Good ADL Goals Pt Will Perform Grooming: with modified independence;standing Pt Will Perform Lower Body Bathing: with modified independence Pt Will Perform Lower Body Dressing: with modified independence;sit to/from stand Pt Will Transfer to Toilet: with modified independence;regular height  toilet;ambulating Pt Will Perform Toileting - Clothing Manipulation and hygiene: with modified independence  Plan Discharge plan remains appropriate;Frequency remains appropriate    Co-evaluation                 AM-PAC OT "6 Clicks" Daily Activity     Outcome Measure   Help from another person eating meals?: None Help from another person taking care of personal grooming?: A Little Help from another person toileting, which includes using toliet, bedpan, or urinal?: A Little Help from another person bathing (including washing, rinsing, drying)?: A Little Help from another person to put on and taking off regular upper body clothing?: A Little Help from another person to put on and taking off regular lower body clothing?: A Little 6 Click Score: 19    End of Session Equipment Utilized During Treatment: Rolling walker (2 wheels)  OT Visit Diagnosis: Unsteadiness on feet (R26.81);Other symptoms and signs involving cognitive function   Activity Tolerance Patient tolerated treatment well   Patient Left in chair;with call bell/phone within reach;with family/visitor present   Nurse Communication Mobility status        Time: 1751-0258 OT Time Calculation (min): 31 min  Charges: OT General Charges $OT Visit: 1 Visit OT Treatments $Self Care/Home Management : 23-37 mins  Shane Lam, OT/L  Acute Rehab [place] 07/07/2021, 5:14 PM

## 2021-07-07 NOTE — Progress Notes (Signed)
PROGRESS NOTE  Shane Lam VEL:[id number] DOB: 04/15/1951 DOA: 07/01/2021 PCP: [name], MD  HPI/Recap of past 24 hours: Shane Lam is a 70 y.o. male with medical history significant of HTN, HLD, chronic hypokalemia, who came with new onset of fever and generalized weakness and unintentional weight loss more than 25 pounds in the past several months.  Work-up revealed innumerable lesions in the liver seen on CT scan, an MRI was suggested which revealed findings worrisome for metastatic disease.  8 mm cyst in the head body junction region of the pancreas recommendation for follow-up MRI abdomen without and with contrast in 1 year.  CT scan 06/30/21 could not exclude the possibility of a partially circumferential rectal mass.  Recommended correlation with rectal exam.  Post flexible sigmoidoscopy on 07/03/2021 by Dr. [name], which was unrevealing.  IR consulted for possible liver lesions biopsies.  Plan for liver biopsy on 07/06/2021, however INR was elevated at 3.4, received 5 mg of vitamin K for goal INR of 1.5 prior to procedure.  07/06/2021: Patient was seen and examined at his bedside.  His wife was present in the room.  He is alert and oriented x3 but with slow mentation.  Patient had a fall overnight, denies hitting his head.  Bruising noted on the left lateral side of his back.  He denies having any pain.  Ammonia level obtained, normal.  No evidence of active infective process.  WBC downtrending, procalcitonin downtrending.  07/07/21:  Feels better, delirium is improved.  A&O x 3.  His wife at bedside.  She has been very helpful in reorienting him and having a familiar face which helped his delirium.  INR is elevated, given extra doses of vitamin K.   Assessment/Plan: Active Problems:   Sepsis ([facility])  SIRS, no clear source of infection. WBC 17.3, T-max 102.4, respiration rate 30. No evidence of pneumonia on chest x-ray. No UTI on urinalysis. Blood cultures negative to date. MRSA  screen negative. IV antibiotics discontinued on 07/04/2021, no evidence of active infective process. WBC and procalcitonin downtrending. Afebrile for the last 24 to 48 hours.  Innumerable lesions in the liver seen on CT scan/MRI abdomen, worrisome for metastatic disease. GI consulted, appreciate assistance. LFTs are normal IR consulted for possible liver lesions biopsies. Plan follow liver biopsies on 07/06/2021 however was postponed due to coagulopathy.  Acute metabolic encephalopathy, suspect multifactorial secondary to acute illness, versus others Per his wife at bedside he is not at his baseline.  Mentation is slower than baseline. Ammonia level, CT head were unrevealing No evidence of active infective process. Reorient as needed Delirium precautions Fall precautions  Coagulopathy in the setting of innumerable lesions in the liver seen on CT scan INR level this morning 3.4, from 1.4 on admission 5 days ago. LFTs, ammonia level within normal limits Vitamin K 10 mg daily x2 days ordered for goal INR of 1.5 prior to procedure. Obtain INR in the morning.  Mild acute metabolic encephalopathy suspect multifactorial, secondary to acute illness, iatrogenic from possibly Phenergan Reorient as needed Monitor closely  Refractory hypokalemia, repleted Home spironolactone 25 mg daily and home irbesartan 75 mg daily restarted Continue home potassium supplement 20 mEq twice daily. Additional dose of p.o. potassium 20 mg x 1 Monitor serum potassium level.  Bradycardia Heart rate in the 40s to 50s Hold off home felodipine to avoid worsening bradycardia  Essential hypertension BP is not at goal Elevated Continue home oral antihypertensives except felodipine Closely monitor vital signs  Rectal mass, ruled  out on flex sigmoidoscopy. Planned flexible sigmoidoscopy completed on 07/03/2021 by Dr. [name], no acute findings.  Acute blood loss anemia, unclear etiology Hemoglobin dropped 11.9  from 13.2. No overt bleeding Continue to closely monitor.  Resolved mild hypovolemic hyponatremia Serum sodium 134>> 136. Continue IV fluid hydration normal saline at 75 cc/h  CKD 2 He appears to be at his baseline creatinine 1.2 with GFR of 60. Continue to monitor for toxic agents, dehydration and hypotension.  Continue to avoid nephrotoxic agents, dehydration and hypotension.  Continue gentle IV fluid hydration. No urine output recorded Repeat BMP in the morning.  Hyperlipidemia LFTs are within normal limits. Holding off home Crestor 5 mg daily due to liver lesions.  Generalized weakness Seen by physical therapy and Occupational Therapy. Continue to mobilize as tolerated.  Moderate protein calorie malnutrition BMI 23 Albumin 2.4 Moderate muscle mass loss Dietitian consult  Critical care time: 65 minutes.   Code Status: Full code  Family Communication: Updated his wife at bedside.  Disposition Plan: Likely will discharge to home once GI and IR sign off.     Consultants: GI  Procedures: None  Antimicrobials: IV vancomycin, DC'd on 07/02/2021. IV Zosyn, DC'd on 07/04/2021.  DVT prophylaxis: Subcu Lovenox daily  Status is: Inpatient  Patient will require at least 2 midnights for further evaluation and treatment of present condition.      Objective: Vitals:   07/06/21 2124 07/07/21 0444 07/07/21 0824 07/07/21 1627  BP: (!) 142/98 129/83 (!) 146/92 115/83  Pulse: 65 (!) 53 (!) 50 92  Resp:   18 16  Temp: 98.2 F (36.8 C) 98.7 F (37.1 C) 97.7 F (36.5 C) 98.4 F (36.9 C)  TempSrc: Oral Oral Oral Oral  SpO2: 100% 100% 100% 100%  Weight:      Height:        Intake/Output Summary (Last 24 hours) at 07/07/2021 1853 Last data filed at 07/07/2021 1645 Gross per 24 hour  Intake 1049.42 ml  Output 1475 ml  Net -425.58 ml   Filed Weights   07/05/21 1911  Weight: 69.9 kg    Exam:  General: 70 y.o. year-old male frail-appearing in no acute  distress.  He is alert and oriented x3 however his mentation is slow compared to his baseline.   Cardiovascular: Regular rate and rhythm no rubs or gallops. Respiratory: Clear to auscultation with no wheezes or rales.  Abdomen: Soft nontender normal bowel sounds present.  Musculoskeletal: No lower extremity edema bilaterally.   Skin: No ulcerative lesions noted.  Mild bruising noted at left lateral mid back. Psychiatry: Mood is appropriate for condition and setting. Neuro: Alert and awake, moves all 4 extremities equally.  Data Reviewed: CBC: Recent Labs  Lab 07/01/21 1614 07/03/21 0918 07/04/21 1307 07/05/21 0718 07/06/21 0038 07/06/21 0838 07/07/21 0224  WBC 17.3*   < > 11.6* 11.4* 13.5* 11.3* 10.8*  NEUTROABS 15.0*  --   --   --   --   --   --   HGB 13.2   < > 13.5 12.7* 12.6* 12.8* 11.8*  HCT 39.8   < > 39.6 37.4* 36.8* 37.4* 35.5*  MCV 89.4   < > 87.2 87.6 86.6 87.6 87.9  PLT 246   < > 242 259 261 284 306   < > = values in this interval not displayed.   Basic Metabolic Panel: Recent Labs  Lab 07/03/21 0918 07/04/21 1307 07/05/21 0718 07/06/21 0616 07/06/21 0838 07/07/21 0224  NA 137 136 136 135 137  136  K 3.2* 3.2* 3.3* 3.5 3.4* 3.8  CL 105 102 105 106 106 109  CO2 26 26 23 24 24 22   GLUCOSE 97 104* 103* 107* 105* 102*  BUN 11 14 19 18 19 21   CREATININE 1.32* 1.28* 1.18 1.07 1.29* 1.18  CALCIUM 8.3* 8.5* 8.5* 8.4* 8.6* 8.6*  MG 2.2  --  2.2  --   --   --   PHOS 3.0  --  2.5  --   --   --    GFR: Estimated Creatinine Clearance: 55.7 mL/min (by C-G formula based on SCr of 1.18 mg/dL). Liver Function Tests: Recent Labs  Lab 07/01/21 1614 07/03/21 0918 07/05/21 0718 07/06/21 0838  AST 27 16 36 22  ALT 38 28 34 28  ALKPHOS 109 84 90 71  BILITOT 0.9 0.8 0.9 1.0  PROT 6.7 6.0* 5.8* 5.3*  ALBUMIN 3.2* 2.6* 2.5* 2.4*   Recent Labs  Lab 07/01/21 1626  LIPASE 25   Recent Labs  Lab 07/01/21 1744 07/06/21 0838  AMMONIA 26 13   Coagulation  Profile: Recent Labs  Lab 07/01/21 1614 07/02/21 0252 07/06/21 0038 07/07/21 0224 07/07/21 1634  INR 1.4* 1.7* 3.4* 2.9* 3.2*   Cardiac Enzymes: No results for input(s): CKTOTAL, CKMB, CKMBINDEX, TROPONINI in the last 168 hours. BNP (last 3 results) No results for input(s): PROBNP in the last 8760 hours. HbA1C: Recent Labs    07/05/21 0718  HGBA1C 5.6   CBG: No results for input(s): GLUCAP in the last 168 hours. Lipid Profile: Recent Labs    07/05/21 0718  CHOL 101  HDL 18*  LDLCALC 68  TRIG 75  CHOLHDL 5.6   Thyroid Function Tests: Recent Labs    07/07/21 1000  TSH 1.799   Anemia Panel: Recent Labs    07/07/21 1000  VITAMINB12 801   Urine analysis:    Component Value Date/Time   COLORURINE COLORLESS (A) 07/01/2021 1941   APPEARANCEUR CLEAR 07/01/2021 1941   LABSPEC 1.003 (L) 07/01/2021 1941   PHURINE 6.0 07/01/2021 1941   GLUCOSEU NEGATIVE 07/01/2021 1941   HGBUR SMALL (A) 07/01/2021 1941   BILIRUBINUR NEGATIVE 07/01/2021 1941   KETONESUR NEGATIVE 07/01/2021 1941   PROTEINUR NEGATIVE 07/01/2021 1941   NITRITE NEGATIVE 07/01/2021 1941   LEUKOCYTESUR NEGATIVE 07/01/2021 1941   Sepsis Labs: @LABRCNTIP (procalcitonin:4,lacticidven:4)  ) Recent Results (from the past 240 hour(s))  Resp Panel by RT-PCR (Flu A&B, Covid) Nasopharyngeal Swab     Status: None   Collection Time: 06/30/21  6:45 PM   Specimen: Nasopharyngeal Swab; Nasopharyngeal(NP) swabs in vial transport medium  Result Value Ref Range Status   SARS Coronavirus 2 by RT PCR NEGATIVE NEGATIVE Final    Comment: (NOTE) SARS-CoV-2 target nucleic acids are NOT DETECTED.  The SARS-CoV-2 RNA is generally detectable in upper respiratory specimens during the acute phase of infection. The lowest concentration of SARS-CoV-2 viral copies this assay can detect is 138 copies/mL. A negative result does not preclude SARS-Cov-2 infection and should not be used as the sole basis for treatment or other  patient management decisions. A negative result may occur with  improper specimen collection/handling, submission of specimen other than nasopharyngeal swab, presence of viral mutation(s) within the areas targeted by this assay, and inadequate number of viral copies(<138 copies/mL). A negative result must be combined with clinical observations, patient history, and epidemiological information. The expected result is Negative.  Fact Sheet for Patients:  EntrepreneurPulse.com.au  Fact Sheet for Healthcare Providers:  IncredibleEmployment.be  This  test is no t yet approved or cleared by the [place] and  has been authorized for detection and/or diagnosis of SARS-CoV-2 by FDA under an Emergency Use Authorization (EUA). This EUA will remain  in effect (meaning this test can be used) for the duration of the COVID-19 declaration under Section 564(b)(1) of the Act, 21 U.S.C.section 360bbb-3(b)(1), unless the authorization is terminated  or revoked sooner.       Influenza A by PCR NEGATIVE NEGATIVE Final   Influenza B by PCR NEGATIVE NEGATIVE Final    Comment: (NOTE) The Xpert Xpress SARS-CoV-2/FLU/RSV plus assay is intended as an aid in the diagnosis of influenza from Nasopharyngeal swab specimens and should not be used as a sole basis for treatment. Nasal washings and aspirates are unacceptable for Xpert Xpress SARS-CoV-2/FLU/RSV testing.  Fact Sheet for Patients: EntrepreneurPulse.com.au  Fact Sheet for Healthcare Providers: IncredibleEmployment.be  This test is not yet approved or cleared by the [place] FDA and has been authorized for detection and/or diagnosis of SARS-CoV-2 by FDA under an Emergency Use Authorization (EUA). This EUA will remain in effect (meaning this test can be used) for the duration of the COVID-19 declaration under Section 564(b)(1) of the Act, 21 U.S.C. section  360bbb-3(b)(1), unless the authorization is terminated or revoked.  Performed at [facility], [address] [place], [place] [address]   Blood culture (routine x 2)     Status: None   Collection Time: 06/30/21  9:20 PM   Specimen: BLOOD  Result Value Ref Range Status   Specimen Description   Final    BLOOD BOTTLES DRAWN AEROBIC AND ANAEROBIC Performed at [facility], [address], [place], [place] [address]    Special Requests   Final    Blood Culture adequate volume LEFT ANTECUBITAL Performed at [facility], [address] [place], [place] [address]    Culture   Final    NO GROWTH 5 DAYS Performed at [facility], [place] [address] [place], [place] [address]    Report Status 07/06/2021 FINAL  Final  Blood culture (routine x 2)     Status: None   Collection Time: 06/30/21  9:30 PM   Specimen: BLOOD  Result Value Ref Range Status   Specimen Description   Final    BLOOD BOTTLES DRAWN AEROBIC AND ANAEROBIC Performed at [facility], [address] [place], [place] [address]    Special Requests   Final    Blood Culture adequate volume RIGHT ANTECUBITAL Performed at [facility], [address] [place], [place] [address]    Culture   Final    NO GROWTH 5 DAYS Performed at [facility], [place] [address] [place], [place] [address]    Report Status 07/06/2021 FINAL  Final  MRSA Next Gen by PCR, Nasal     Status: None   Collection Time: 07/02/21  3:24 PM   Specimen: Nasal Mucosa; Nasal Swab  Result Value Ref Range Status   MRSA by PCR Next Gen NOT DETECTED NOT DETECTED Final    Comment: (NOTE) The GeneXpert MRSA Assay (FDA approved for NASAL specimens only), is one component of a comprehensive MRSA colonization surveillance program. It is not intended to diagnose MRSA infection nor to guide or monitor treatment for MRSA infections. Test performance is not FDA  approved in patients less than 6 years old. Performed at [facility], [place] [address], [place], [place] [address]  Studies: No results found.  Scheduled Meds:  feeding supplement (KATE FARMS STANDARD 1.4)  325 mL Oral BID BM   felodipine  5 mg Oral Daily   irbesartan  75 mg Oral Daily   multivitamin with minerals  1 tablet Oral Daily   potassium chloride SA  20 mEq Oral BID   spironolactone  25 mg Oral Daily    Continuous Infusions:  lactated ringers 50 mL/hr at 07/07/21 0821      LOS: 6 days     Kayleen Memos, MD [facility] Pager [phone]  If 7PM-7AM, please contact night-coverage [email or web address] Password [facility] 07/07/2021, 6:53 PM

## 2021-07-07 NOTE — Progress Notes (Signed)
Physical Therapy Treatment Patient Details Name: Shane Lam MRN: [id number] DOB: 21-Apr-1951 Today's Date: 07/07/2021   History of Present Illness Shane Lam is a 70 y.o. male with medical history significant of HTN, BPH, HLD came with new onset of fever and generalized weakness and weight loss; Sepsis; Imaging reveals metastatic liver Ca    PT Comments    Continuing work on functional mobility and activity tolerance;  session focused on progressive amb, and pt was able to walk the hallways with RW and overall much improved steadiness with wider base of support; Will consider practicing with cane next session, as well as performing balance challenge therex   Recommendations for follow up therapy are one component of a multi-disciplinary discharge planning process, led by the attending physician.  Recommendations may be updated based on patient status, additional functional criteria and insurance authorization.  Follow Up Recommendations  Home health PT     Assistance Recommended at Discharge Frequent or constant Supervision/Assistance  Equipment Recommendations  Rolling walker (2 wheels);BSC/3in1    Recommendations for Other Services       Precautions / Restrictions Precautions Precautions: Fall     Mobility  Bed Mobility Overal bed mobility: Needs Assistance Bed Mobility: Supine to Sit     Supine to sit: Min assist     General bed mobility comments: Min handheld assist today, mostly for tactile cueing and to lead him to get up    Transfers Overall transfer level: Needs assistance Equipment used: Rolling walker (2 wheels) Transfers: Sit to/from Stand Sit to Stand: Min guard           General transfer comment: Tended to pull up on RW    Ambulation/Gait Ambulation/Gait assistance: Min guard (with and without physical contact) Gait Distance (Feet): 550 Feet Assistive device: Rolling walker (2 wheels) Gait Pattern/deviations: Step-through pattern        General Gait Details: Walked with RW with much better overall steadiness; Pt's balance is taxed the most with tasks that involve a small base of support, and the RW makes base of support bigger   Stairs             Wheelchair Mobility    Modified Rankin (Stroke Patients Only)       Balance     Sitting balance-Leahy Scale: Good     Standing balance support: During functional activity Standing balance-Leahy Scale: Fair                              Cognition Arousal/Alertness: Awake/alert Behavior During Therapy: WFL for tasks assessed/performed Overall Cognitive Status: Impaired/Different from baseline Area of Impairment: Safety/judgement;Awareness;Memory                     Memory: Decreased short-term memory   Safety/Judgement: Decreased awareness of safety;Decreased awareness of deficits Awareness: Emergent   General Comments: Became emotional at the end of session as he discussed living in [place]; Repeated "God is good" a few times; offered tissues        Exercises      General Comments        Pertinent Vitals/Pain Pain Assessment: No/denies pain    Home Living                          Prior Function            PT Goals (current goals can now be found in  the care plan section) Acute Rehab PT Goals Patient Stated Goal: Did not state, but agreeable to amb PT Goal Formulation: With patient Time For Goal Achievement: 07/18/21 Potential to Achieve Goals: Good Progress towards PT goals: Progressing toward goals    Frequency    Min 3X/week      PT Plan Current plan remains appropriate    Co-evaluation              AM-PAC PT "6 Clicks" Mobility   Outcome Measure  Help needed turning from your back to your side while in a flat bed without using bedrails?: None Help needed moving from lying on your back to sitting on the side of a flat bed without using bedrails?: A Little Help needed moving to and from a  bed to a chair (including a wheelchair)?: A Little Help needed standing up from a chair using your arms (e.g., wheelchair or bedside chair)?: A Little Help needed to walk in hospital room?: A Little Help needed climbing 3-5 steps with a railing? : A Lot 6 Click Score: 18    End of Session Equipment Utilized During Treatment: Gait belt Activity Tolerance: Patient tolerated treatment well;Patient limited by fatigue Patient left: in bed;with call bell/phone within reach;with family/visitor present Nurse Communication: Mobility status PT Visit Diagnosis: Unsteadiness on feet (R26.81);Other abnormalities of gait and mobility (R26.89)     Time: 1610-9604 PT Time Calculation (min) (ACUTE ONLY): 22 min  Charges:  $Gait Training: 8-22 mins                     Roney Marion, [place] Pager [phone] Office [phone]    Colletta [place] 07/07/2021, 2:26 PM

## 2021-07-08 DIAGNOSIS — E871 Hypo-osmolality and hyponatremia: Secondary | ICD-10-CM

## 2021-07-08 DIAGNOSIS — I10 Essential (primary) hypertension: Secondary | ICD-10-CM | POA: Diagnosis not present

## 2021-07-08 DIAGNOSIS — E876 Hypokalemia: Secondary | ICD-10-CM | POA: Insufficient documentation

## 2021-07-08 DIAGNOSIS — C787 Secondary malignant neoplasm of liver and intrahepatic bile duct: Secondary | ICD-10-CM

## 2021-07-08 DIAGNOSIS — R6510 Systemic inflammatory response syndrome (SIRS) of non-infectious origin without acute organ dysfunction: Secondary | ICD-10-CM | POA: Diagnosis not present

## 2021-07-08 DIAGNOSIS — R001 Bradycardia, unspecified: Secondary | ICD-10-CM | POA: Insufficient documentation

## 2021-07-08 DIAGNOSIS — K769 Liver disease, unspecified: Secondary | ICD-10-CM | POA: Insufficient documentation

## 2021-07-08 DIAGNOSIS — E43 Unspecified severe protein-calorie malnutrition: Secondary | ICD-10-CM

## 2021-07-08 LAB — CBC
HCT: 33.8 % — ABNORMAL LOW (ref 39.0–52.0)
Hemoglobin: 11.2 g/dL — ABNORMAL LOW (ref 13.0–17.0)
MCH: 29.1 pg (ref 26.0–34.0)
MCHC: 33.1 g/dL (ref 30.0–36.0)
MCV: 87.8 fL (ref 80.0–100.0)
Platelets: 340 10*3/uL (ref 150–400)
RBC: 3.85 MIL/uL — ABNORMAL LOW (ref 4.22–5.81)
RDW: 13.6 % (ref 11.5–15.5)
WBC: 10.6 10*3/uL — ABNORMAL HIGH (ref 4.0–10.5)
nRBC: 0 % (ref 0.0–0.2)

## 2021-07-08 LAB — BASIC METABOLIC PANEL
Anion gap: 6 (ref 5–15)
BUN: 16 mg/dL (ref 8–23)
CO2: 25 mmol/L (ref 22–32)
Calcium: 8.5 mg/dL — ABNORMAL LOW (ref 8.9–10.3)
Chloride: 106 mmol/L (ref 98–111)
Creatinine, Ser: 1.08 mg/dL (ref 0.61–1.24)
GFR, Estimated: >60 mL/min (ref >60)
Glucose, Bld: 94 mg/dL (ref 70–99)
Potassium: 3.6 mmol/L (ref 3.5–5.1)
Sodium: 137 mmol/L (ref 135–145)

## 2021-07-08 LAB — PROTIME-INR
INR: 2.8 — ABNORMAL HIGH (ref 0.8–1.2)
Prothrombin Time: 29.6 seconds — ABNORMAL HIGH (ref 11.4–15.2)

## 2021-07-08 LAB — PHOSPHORUS: Phosphorus: 3.3 mg/dL (ref 2.5–4.6)

## 2021-07-08 LAB — PROCALCITONIN: Procalcitonin: 0.49 ng/mL

## 2021-07-08 LAB — MAGNESIUM: Magnesium: 2 mg/dL (ref 1.7–2.4)

## 2021-07-08 MED ORDER — VITAMIN K1 10 MG/ML IJ SOLN
5.0000 mg | Freq: Once | INTRAVENOUS | Status: AC
  Administered 2021-07-08: 5 mg via INTRAVENOUS
  Filled 2021-07-08: qty 0.5

## 2021-07-08 NOTE — Progress Notes (Addendum)
PROGRESS NOTE    Shane Lam  ZOX:[id number] DOB: 04-Oct-1950 DOA: 07/01/2021 PCP: [name], MD    Brief Narrative:  Shane Lam was admitted to the hospital with the working diagnosis of systemic inflammatory response, in the setting of metastatic liver cancer.   70 year old male past medical history for hypertension, BPH, and dyslipidemia who presented with fever and generalized weakness.  Reported 5 to 6 days of fever, generalized weakness, malaise and fatigue.  Poor oral intake with no dyspnea or chest pain.  Because of persistent symptoms on 06/30/2021 he presented to the ED, work-up included a CT of the abdomen showing diffuse heterogeneous liver parenchyma changes suggesting numerous low-density lesions. He was discharged home with instructions to follow-up as an outpatient.  At home he had recurrent fever that prompted him to come back to the emergency department.  On his initial physical examination he was febrile 102.3 F, blood pressure 120/80, heart rate 97, respiratory rate 17, oxygen saturation 98%, his lungs were clear to auscultation bilaterally, heart S1-S2, present, rhythmic, his abdomen was soft, no lower extremity edema.  Sodium 130, potassium 4.2, chloride 96, bicarb 26, glucose 1 7, BUN 15, creatinine 1.43, albumin 3.2, lactic acid 3.0, white count 17.3, hemoglobin 13.2, hematocrit 39.8, platelets 246. SARS COVID-19 negative.  Urinalysis specific gravity 1.003, negative nitrates negative leukocytes.  Chest radiograph, no infiltrates.  EKG 92 bpm, left axis deviation, normal intervals, sinus rhythm, Q-wave V1-V2, no significant ST segment or T wave changes.  Further work-up with abdominal MRI showed innumerable lesions throughout the liver appearing to be diffusion positive.  Findings worrisome for metastatic disease.  8 mm cyst in the head body junction region of the pancreas. Partially circumferential rectal mass.  Gastroenterology was consulted, on 07/03/2021  patient underwent flexible sigmoidoscopy which was negative.  IR has been consulted for a possible image guided biopsy of liver lesions.  Assessment & Plan:   Principal Problem:   Systemic inflammatory response syndrome (HCC) Active Problems:   Protein-calorie malnutrition, severe   Malignant neoplasm metastatic to liver (HCC)   Hyponatremia   Hypokalemia   Essential hypertension   Bradycardia   Systemic inflammatory response. In the setting of metastatic liver lesions/ coagulopathy.  Inflammatory response likely due to malignancy, no signs of bacterial infection.   Antibiotic therapy has been discontinued.  Suspected metastatic disease to the liver, pending biopsy. It has been postponed due to coagulopathy.  INR today is 2,8   Plan to continue Vitamin K supplementation for suspected vitamin K deficiency.   2. Acute metabolic encephalopathy clinically resolved, continue to encourage mobility, out of bed to chair tid with meals.   3. CKD stage 2, hponatremia, hypokalemia,  renal function stable, patient is tolerating po well. Off IV fluids  Renal function with serum cr at 1,0, K is 3,6 and serum bicarbonate at 25. Mg is 2,0   4. HTN, dyslipidemia continue blood pressure monitoring Holding on statin due to liver lesions  Continue blood pressure control with irbesartan and spironolactone.   5. Chronic anemia, moderate calorie protein malnutrition  Hgb has been stable, no signs of acute bleeding, acute blood loss anemia has been ruled out.  Hgb is 11,2  Continue nutritional supplementation.   7. Bradycardia. HR 50 to 92, blood pressure stable, will discontinue telemetry monitoring  Discontinue felodipine for now.   Patient continue to be at high risk for   Status is: Inpatient  Remains inpatient appropriate because: pending liver biopsy   DVT prophylaxis: Enoxaparin  Code Status:    full  Family Communication:   I spoke with patient's wife at the bedside, we talked in  detail about patient's condition, plan of care and prognosis and all questions were addressed.      Nutrition Status: Nutrition Problem: Severe Malnutrition Etiology: chronic illness (possible liver cancer) Signs/Symptoms: severe fat depletion, moderate muscle depletion, percent weight loss Percent weight loss: 8 % Interventions: MVI, Other (Comment) [employer])    Consultants:  IR     Subjective: Patient with no nausea or vomiting, continue to be very weak and deconditioned, no chest pain.   Objective: Vitals:   07/07/21 1627 07/07/21 2011 07/08/21 0512 07/08/21 0747  BP: 115/83 (!) 128/95 (!) 135/93 (!) 134/94  Pulse: 92 69 (!) 56 (!) 58  Resp: 16 17 18 17   Temp: 98.4 F (36.9 C) 98.7 F (37.1 C) 98.5 F (36.9 C) 98.3 F (36.8 C)  TempSrc: Oral Oral Oral Oral  SpO2: 100% 100% 100% 100%  Weight:      Height:        Intake/Output Summary (Last 24 hours) at 07/08/2021 1220 Last data filed at 07/08/2021 1052 Gross per 24 hour  Intake 589.42 ml  Output 2125 ml  Net -1535.58 ml   Filed Weights   07/05/21 1911  Weight: 69.9 kg    Examination:   General: Not in pain or dyspnea, deconditioned  Neurology: Awake and alert, non focal  E ENT: mild pallor, no icterus, oral mucosa moist Cardiovascular: No JVD. S1-S2 present, rhythmic, no gallops, rubs, or murmurs. No lower extremity edema. Pulmonary: positive breath sounds bilaterally, adequate air movement, no wheezing, rhonchi or rales. Gastrointestinal. Abdomen soft and non tender Skin. No rashes Musculoskeletal: no joint deformities     Data Reviewed: I have personally reviewed following labs and imaging studies  CBC: Recent Labs  Lab 07/01/21 1614 07/03/21 0918 07/05/21 0718 07/06/21 0038 07/06/21 0838 07/07/21 0224 07/08/21 0159  WBC 17.3*   < > 11.4* 13.5* 11.3* 10.8* 10.6*  NEUTROABS 15.0*  --   --   --   --   --   --   HGB 13.2   < > 12.7* 12.6* 12.8* 11.8* 11.2*  HCT 39.8   < > 37.4* 36.8*  37.4* 35.5* 33.8*  MCV 89.4   < > 87.6 86.6 87.6 87.9 87.8  PLT 246   < > 259 261 284 306 340   < > = values in this interval not displayed.   Basic Metabolic Panel: Recent Labs  Lab 07/03/21 0918 07/04/21 1307 07/05/21 0718 07/06/21 0616 07/06/21 0838 07/07/21 0224 07/08/21 0159  NA 137   < > 136 135 137 136 137  K 3.2*   < > 3.3* 3.5 3.4* 3.8 3.6  CL 105   < > 105 106 106 109 106  CO2 26   < > 23 24 24 22 25   GLUCOSE 97   < > 103* 107* 105* 102* 94  BUN 11   < > 19 18 19 21 16   CREATININE 1.32*   < > 1.18 1.07 1.29* 1.18 1.08  CALCIUM 8.3*   < > 8.5* 8.4* 8.6* 8.6* 8.5*  MG 2.2  --  2.2  --   --   --  2.0  PHOS 3.0  --  2.5  --   --   --  3.3   < > = values in this interval not displayed.   GFR: Estimated Creatinine Clearance: 60.9 mL/min (by C-G formula  based on SCr of 1.08 mg/dL). Liver Function Tests: Recent Labs  Lab 07/01/21 1614 07/03/21 0918 07/05/21 0718 07/06/21 0838  AST 27 16 36 22  ALT 38 28 34 28  ALKPHOS 109 84 90 71  BILITOT 0.9 0.8 0.9 1.0  PROT 6.7 6.0* 5.8* 5.3*  ALBUMIN 3.2* 2.6* 2.5* 2.4*   Recent Labs  Lab 07/01/21 1626  LIPASE 25   Recent Labs  Lab 07/01/21 1744 07/06/21 0838  AMMONIA 26 13   Coagulation Profile: Recent Labs  Lab 07/02/21 0252 07/06/21 0038 07/07/21 0224 07/07/21 1634 07/08/21 0159  INR 1.7* 3.4* 2.9* 3.2* 2.8*   Cardiac Enzymes: No results for input(s): CKTOTAL, CKMB, CKMBINDEX, TROPONINI in the last 168 hours. BNP (last 3 results) No results for input(s): PROBNP in the last 8760 hours. HbA1C: No results for input(s): HGBA1C in the last 72 hours. CBG: No results for input(s): GLUCAP in the last 168 hours. Lipid Profile: No results for input(s): CHOL, HDL, LDLCALC, TRIG, CHOLHDL, LDLDIRECT in the last 72 hours. Thyroid Function Tests: Recent Labs    07/07/21 1000  TSH 1.799   Anemia Panel: Recent Labs    07/07/21 1000  [place]      Radiology Studies: I have reviewed all of the  imaging during this hospital visit personally     Scheduled Meds:  feeding supplement (KATE FARMS STANDARD 1.4)  325 mL Oral BID BM   felodipine  5 mg Oral Daily   irbesartan  75 mg Oral Daily   multivitamin with minerals  1 tablet Oral Daily   potassium chloride SA  20 mEq Oral BID   spironolactone  25 mg Oral Daily   Continuous Infusions:  lactated ringers 50 mL/hr at 07/08/21 0207     LOS: 7 days        Enaya Howze Gerome Apley, MD

## 2021-07-08 NOTE — Progress Notes (Signed)
Mobility Specialist Criteria Algorithm Info.  Mobility Team: HOB elevated: Activity: Ambulated in hall Range of motion: Active; All extremities Level of assistance: Standby assist, set-up cues, supervision of patient - no hands on Assistive device: Front wheel walker (550 w/ RW; 550 w/out RW) Minutes ambulated: 8 minutes Distance ambulated (ft): 1100 ft Mobility response: Tolerated well Bed Position: Semi-fowlers  Patient ambulated in hallway supervision level with steady gait. Ambulated half the distance with a RW and the other half without, using IV pole to steady. LOB x1 but d/t patient stubbing his toe on IV pole.Tolerated ambulation well without complaint or incident. Was left lying supine in bed with all needs met and family present.   07/08/2021 4:56 PM

## 2021-07-09 DIAGNOSIS — I10 Essential (primary) hypertension: Secondary | ICD-10-CM | POA: Diagnosis not present

## 2021-07-09 DIAGNOSIS — E876 Hypokalemia: Secondary | ICD-10-CM | POA: Diagnosis not present

## 2021-07-09 DIAGNOSIS — R001 Bradycardia, unspecified: Secondary | ICD-10-CM | POA: Diagnosis not present

## 2021-07-09 DIAGNOSIS — R6510 Systemic inflammatory response syndrome (SIRS) of non-infectious origin without acute organ dysfunction: Secondary | ICD-10-CM | POA: Diagnosis not present

## 2021-07-09 LAB — BASIC METABOLIC PANEL
Anion gap: 4 — ABNORMAL LOW (ref 5–15)
BUN: 23 mg/dL (ref 8–23)
CO2: 26 mmol/L (ref 22–32)
Calcium: 8.8 mg/dL — ABNORMAL LOW (ref 8.9–10.3)
Chloride: 104 mmol/L (ref 98–111)
Creatinine, Ser: 1.32 mg/dL — ABNORMAL HIGH (ref 0.61–1.24)
GFR, Estimated: 57 mL/min — ABNORMAL LOW (ref >60)
Glucose, Bld: 98 mg/dL (ref 70–99)
Potassium: 4 mmol/L (ref 3.5–5.1)
Sodium: 134 mmol/L — ABNORMAL LOW (ref 135–145)

## 2021-07-09 LAB — CBC
HCT: 34.2 % — ABNORMAL LOW (ref 39.0–52.0)
Hemoglobin: 11.5 g/dL — ABNORMAL LOW (ref 13.0–17.0)
MCH: 29.9 pg (ref 26.0–34.0)
MCHC: 33.6 g/dL (ref 30.0–36.0)
MCV: 88.8 fL (ref 80.0–100.0)
Platelets: 371 10*3/uL (ref 150–400)
RBC: 3.85 MIL/uL — ABNORMAL LOW (ref 4.22–5.81)
RDW: 13.7 % (ref 11.5–15.5)
WBC: 12.1 10*3/uL — ABNORMAL HIGH (ref 4.0–10.5)
nRBC: 0 % (ref 0.0–0.2)

## 2021-07-09 LAB — HEPATITIS B SURFACE ANTIGEN: Hepatitis B Surface Ag: NONREACTIVE

## 2021-07-09 LAB — PROTIME-INR
INR: 2.4 — ABNORMAL HIGH (ref 0.8–1.2)
INR: 3.9 — ABNORMAL HIGH (ref 0.8–1.2)
Prothrombin Time: 26.1 seconds — ABNORMAL HIGH (ref 11.4–15.2)
Prothrombin Time: 38.5 seconds — ABNORMAL HIGH (ref 11.4–15.2)

## 2021-07-09 LAB — HEPATITIS B CORE ANTIBODY, TOTAL: Hep B Core Total Ab: NONREACTIVE

## 2021-07-09 NOTE — Progress Notes (Signed)
PROGRESS NOTE    Shane Lam  NTZ:[id number] DOB: [birth year] DOA: 07/01/2021 PCP: [name], MD    Brief Narrative:  [name] was admitted to the hospital with the working diagnosis of systemic inflammatory response, in the setting of metastatic liver cancer.    70 year old male past medical history for hypertension, BPH, and dyslipidemia who presented with fever and generalized weakness.  Reported 5 to 6 days of fever, generalized weakness, malaise and fatigue.  Poor oral intake with no dyspnea or chest pain.  Because of persistent symptoms on 06/30/2021 he presented to the ED, work-up included a CT of the abdomen showing diffuse heterogeneous liver parenchyma changes suggesting numerous low-density lesions. He was discharged home with instructions to follow-up as an outpatient.   At home he had recurrent fever that prompted him to come back to the emergency department.  On his initial physical examination he was febrile 102.3 F, blood pressure 120/80, heart rate 97, respiratory rate 17, oxygen saturation 98%, his lungs were clear to auscultation bilaterally, heart S1-S2, present, rhythmic, his abdomen was soft, no lower extremity edema.   Sodium 130, potassium 4.2, chloride 96, bicarb 26, glucose 1 7, BUN 15, creatinine 1.43, albumin 3.2, lactic acid 3.0, white count 17.3, hemoglobin 13.2, hematocrit 39.8, platelets 246. SARS COVID-19 negative.   Urinalysis specific gravity 1.003, negative nitrates negative leukocytes.   Chest radiograph, no infiltrates.   EKG 92 bpm, left axis deviation, normal intervals, sinus rhythm, Q-wave V1-V2, no significant ST segment or T wave changes.   Further work-up with abdominal MRI showed innumerable lesions throughout the liver appearing to be diffusion positive.  Findings worrisome for metastatic disease.  8 mm cyst in the head body junction region of the pancreas. Partially circumferential rectal mass.  Patient initially placed on antibiotic  therapy. Sepsis was ruled and antibiotics were discontinued.    Gastroenterology was consulted, on 07/03/2021 patient underwent flexible sigmoidoscopy which was negative.   IR has been consulted for a possible image guided biopsy of liver lesions. Biopsy has been delayed due to elevated INR.   Assessment & Plan:   Principal Problem:   Systemic inflammatory response syndrome (HCC) Active Problems:   Protein-calorie malnutrition, severe   Malignant neoplasm metastatic to liver (HCC)   Hyponatremia   Hypokalemia   Essential hypertension   Bradycardia   Systemic inflammatory response. In the setting of metastatic liver lesions/ coagulopathy.  Inflammatory response likely due to malignancy, no signs of bacterial infection.    Suspected metastatic disease to the liver, pending biopsy. It has been postponed due to coagulopathy.  INR continue to be high despite vitamin K supplementation, today is 2,4.  Plan to repeat INR 24 hrs after vitamin K administration, and if still high will plan to transfuse FFP.  Follow INR in am. Will order CT chest malignancy work up.  Check viral panel hep C and hep B, chronic hepatitis.    2. Acute metabolic encephalopathy  Clinically resolved.    3. CKD stage 2, hponatremia, hypokalemia,  Renal function today with serum cr at 1,32, K is 4,0 and serum bicarbonate at 26. Continue to encourage po intake. Hold on IV fluids. Hold on K supplementation due to risk of hyperkalemia.    4. HTN, dyslipidemia  Blood pressure is 137/89 Continue with irbesartan and spironolactone. Close follow up on renal function and electrolytes    5. Chronic anemia, severe calorie protein malnutrition, reactive leukocytosis.  Wbc is 12,1 today with Hgb at 11,5 and Plt 371 Continue  close follow up.    7. Bradycardia. HR 57 to 78,  Continue to hold on felodipine due to risk of worsening bradycardia     Status is: Inpatient  Remains inpatient appropriate because: Pending  liver biopsy    DVT prophylaxis: Scd   Code Status:    full  Family Communication:   I spoke with patient's wife  at the bedside, we talked in detail about patient's condition, plan of care and prognosis and all questions were addressed.      Nutrition Status: Nutrition Problem: Severe Malnutrition Etiology: chronic illness (possible liver cancer) Signs/Symptoms: severe fat depletion, moderate muscle depletion, percent weight loss Percent weight loss: 8 % Interventions: MVI, Other (Comment) [employer])     Consultants:  GI   Procedures:  Flexible sigmoidoscopy     Subjective: Patient with no nausea or vomiting, no chest pain or dyspnea, continue to be very weak and deconditioned,   Objective: Vitals:   07/08/21 1706 07/08/21 2141 07/09/21 0611 07/09/21 0748  BP: 113/86 101/73 137/89 139/90  Pulse: 78 65 (!) 57 (!) 57  Resp: 18 18  17   Temp: 98.7 F (37.1 C) 98.6 F (37 C) 98.4 F (36.9 C) 98.3 F (36.8 C)  TempSrc: Oral Oral Oral Oral  SpO2: 100% 100% 100% 100%  Weight:      Height:        Intake/Output Summary (Last 24 hours) at 07/09/2021 1151 Last data filed at 07/09/2021 1005 Gross per 24 hour  Intake 480 ml  Output 1945 ml  Net -1465 ml   Filed Weights   07/05/21 1911  Weight: 69.9 kg    Examination:   General: Not in pain or dyspnea, deconditioned  Neurology: Awake and alert, non focal  E ENT: no pallor, no icterus, oral mucosa moist Cardiovascular: No JVD. S1-S2 present, rhythmic, no gallops, rubs, or murmurs. No lower extremity edema. Pulmonary: positive breath sounds bilaterally, adequate air movement, no wheezing, rhonchi or rales. Gastrointestinal. Abdomen soft and non tender Skin. No rashes Musculoskeletal: no joint deformities No palpable lymph nodes axillary, or supraclavicular. Small lymph nodes mobile and non tender, small on the right groin,     Data Reviewed: I have personally reviewed following labs and imaging  studies  CBC: Recent Labs  Lab 07/06/21 0038 07/06/21 0838 07/07/21 0224 07/08/21 0159 07/09/21 0135  WBC 13.5* 11.3* 10.8* 10.6* 12.1*  HGB 12.6* 12.8* 11.8* 11.2* 11.5*  HCT 36.8* 37.4* 35.5* 33.8* 34.2*  MCV 86.6 87.6 87.9 87.8 88.8  PLT 261 284 306 340 578   Basic Metabolic Panel: Recent Labs  Lab 07/03/21 0918 07/04/21 1307 07/05/21 0718 07/06/21 0616 07/06/21 0838 07/07/21 0224 07/08/21 0159 07/09/21 0135  NA 137   < > 136 135 137 136 137 134*  K 3.2*   < > 3.3* 3.5 3.4* 3.8 3.6 4.0  CL 105   < > 105 106 106 109 106 104  CO2 26   < > 23 24 24 22 25 26   GLUCOSE 97   < > 103* 107* 105* 102* 94 98  BUN 11   < > 19 18 19 21 16 23   CREATININE 1.32*   < > 1.18 1.07 1.29* 1.18 1.08 1.32*  CALCIUM 8.3*   < > 8.5* 8.4* 8.6* 8.6* 8.5* 8.8*  MG 2.2  --  2.2  --   --   --  2.0  --   PHOS 3.0  --  2.5  --   --   --  3.3  --    < > = values in this interval not displayed.   GFR: Estimated Creatinine Clearance: 49.8 mL/min (A) (by C-G formula based on SCr of 1.32 mg/dL (H)). Liver Function Tests: Recent Labs  Lab 07/03/21 0918 07/05/21 0718 07/06/21 0838  AST 16 36 22  ALT 28 34 28  ALKPHOS 84 90 71  BILITOT 0.8 0.9 1.0  PROT 6.0* 5.8* 5.3*  ALBUMIN 2.6* 2.5* 2.4*   No results for input(s): LIPASE, AMYLASE in the last 168 hours. Recent Labs  Lab 07/06/21 0838  AMMONIA 13   Coagulation Profile: Recent Labs  Lab 07/06/21 0038 07/07/21 0224 07/07/21 1634 07/08/21 0159 07/09/21 0135  INR 3.4* 2.9* 3.2* 2.8* 2.4*   Cardiac Enzymes: No results for input(s): CKTOTAL, CKMB, CKMBINDEX, TROPONINI in the last 168 hours. BNP (last 3 results) No results for input(s): PROBNP in the last 8760 hours. HbA1C: No results for input(s): HGBA1C in the last 72 hours. CBG: No results for input(s): GLUCAP in the last 168 hours. Lipid Profile: No results for input(s): CHOL, HDL, LDLCALC, TRIG, CHOLHDL, LDLDIRECT in the last 72 hours. Thyroid Function Tests: Recent Labs     07/07/21 1000  TSH 1.799   Anemia Panel: Recent Labs    07/07/21 1000  [place]      Radiology Studies: I have reviewed all of the imaging during this hospital visit personally     Scheduled Meds:  feeding supplement (KATE FARMS STANDARD 1.4)  325 mL Oral BID BM   irbesartan  75 mg Oral Daily   multivitamin with minerals  1 tablet Oral Daily   potassium chloride SA  20 mEq Oral BID   spironolactone  25 mg Oral Daily   Continuous Infusions:   LOS: 8 days        Joshva Labreck Gerome Apley, MD

## 2021-07-09 NOTE — Progress Notes (Signed)
Physical Therapy Treatment Patient Details Name: Shane Lam MRN: [id number] DOB: 1951/01/07 Today's Date: 07/09/2021   History of Present Illness Shane Lam is a 70 y.o. male with medical history significant of HTN, BPH, HLD came with new onset of fever and generalized weakness and weight loss; Sepsis; Imaging reveals metastatic liver Ca    PT Comments    Continuing work on functional mobility and activity tolerance;  Session focused on progressive amb, and pt agreeable to walk in the hallway with encouragement; notably more steady with amb; will walk with cane next session   Recommendations for follow up therapy are one component of a multi-disciplinary discharge planning process, led by the attending physician.  Recommendations may be updated based on patient status, additional functional criteria and insurance authorization.  Follow Up Recommendations  Home health PT (will continue to monitor; may not need RW)     Assistance Recommended at Discharge Frequent or constant Supervision/Assistance  Equipment Recommendations  Rolling walker (2 wheels);BSC/3in1;Other (comment);Kasandra Knudsen (will continue to monitor, and consider a cane)    Recommendations for Other Services       Precautions / Restrictions Precautions Precautions: Fall     Mobility  Bed Mobility Overal bed mobility: Needs Assistance Bed Mobility: Supine to Sit     Supine to sit: Supervision          Transfers Overall transfer level: Needs assistance Equipment used: 1 person hand held assist Transfers: Sit to/from Stand Sit to Stand: Min guard                Ambulation/Gait Ambulation/Gait assistance: Min assist Gait Distance (Feet): 700 Feet Assistive device: 1 person hand held assist Gait Pattern/deviations: Step-through pattern Gait velocity: slowed     General Gait Details: Notably improved balance compared to last 2 sessions; included walking up and down a simulated ramp   Stairs              Wheelchair Mobility    Modified Rankin (Stroke Patients Only)       Balance     Sitting balance-Leahy Scale: Good       Standing balance-Leahy Scale: Fair Standing balance comment: Noted more steady with standing activity today, less swaying                            Cognition Arousal/Alertness: Awake/alert Behavior During Therapy: WFL for tasks assessed/performed Overall Cognitive Status: Impaired/Different from baseline Area of Impairment: Safety/judgement;Awareness;Memory                         Safety/Judgement: Decreased awareness of safety;Decreased awareness of deficits Awareness: Emergent            Exercises      General Comments General comments (skin integrity, edema, etc.): Obtained orthostatic BPs which did not show a BP drop in standing at zero minutes and three minutes      Pertinent Vitals/Pain Pain Assessment: No/denies pain    Home Living                          Prior Function            PT Goals (current goals can now be found in the care plan section) Acute Rehab PT Goals Patient Stated Goal: to rest PT Goal Formulation: With patient Time For Goal Achievement: 07/18/21 Potential to Achieve Goals: Good Progress towards PT goals: Progressing toward  goals    Frequency    Min 3X/week      PT Plan Current plan remains appropriate    Co-evaluation              AM-PAC PT "6 Clicks" Mobility   Outcome Measure  Help needed turning from your back to your side while in a flat bed without using bedrails?: None Help needed moving from lying on your back to sitting on the side of a flat bed without using bedrails?: None Help needed moving to and from a bed to a chair (including a wheelchair)?: A Little Help needed standing up from a chair using your arms (e.g., wheelchair or bedside chair)?: A Little Help needed to walk in hospital room?: A Little Help needed climbing 3-5 steps  with a railing? : A Little 6 Click Score: 20    End of Session   Activity Tolerance: Patient tolerated treatment well;Patient limited by fatigue Patient left: in chair;with call bell/phone within reach Nurse Communication: Mobility status PT Visit Diagnosis: Unsteadiness on feet (R26.81);Other abnormalities of gait and mobility (R26.89)     Time: 6213-0865 PT Time Calculation (min) (ACUTE ONLY): 30 min  Charges:  $Gait Training: 23-37 mins                     Roney [place], [place]  Acute Rehabilitation Services Pager [phone] Office [phone]    Colletta [place] 07/09/2021, 5:52 PM

## 2021-07-09 NOTE — Plan of Care (Signed)
Patient ID: Delvonte Berenson, male   DOB: 1950-12-25, 70 y.o.   MRN: [id number]  Problem: Education: Goal: Knowledge of General Education information will improve Description: Including pain rating scale, medication(s)/side effects and non-pharmacologic comfort measures Outcome: Progressing   Problem: Health Behavior/Discharge Planning: Goal: Ability to manage health-related needs will improve Outcome: Progressing   Problem: Clinical Measurements: Goal: Ability to maintain clinical measurements within normal limits will improve Outcome: Progressing Goal: Will remain free from infection Outcome: Progressing Goal: Diagnostic test results will improve Outcome: Progressing Goal: Respiratory complications will improve Outcome: Progressing Goal: Cardiovascular complication will be avoided Outcome: Progressing   Problem: Activity: Goal: Risk for activity intolerance will decrease Outcome: Progressing   Problem: Nutrition: Goal: Adequate nutrition will be maintained Outcome: Progressing   Problem: Coping: Goal: Level of anxiety will decrease Outcome: Progressing   Problem: Elimination: Goal: Will not experience complications related to bowel motility Outcome: Progressing Goal: Will not experience complications related to urinary retention Outcome: Progressing   Problem: Pain Managment: Goal: General experience of comfort will improve Outcome: Progressing   Problem: Safety: Goal: Ability to remain free from injury will improve Outcome: Progressing   Problem: Skin Integrity: Goal: Risk for impaired skin integrity will decrease Outcome: Progressing    Haydee Salter, RN

## 2021-07-10 ENCOUNTER — Inpatient Hospital Stay (HOSPITAL_COMMUNITY): Payer: Medicare Other

## 2021-07-10 ENCOUNTER — Encounter (HOSPITAL_COMMUNITY): Payer: Self-pay | Admitting: Internal Medicine

## 2021-07-10 DIAGNOSIS — R791 Abnormal coagulation profile: Secondary | ICD-10-CM

## 2021-07-10 DIAGNOSIS — D689 Coagulation defect, unspecified: Secondary | ICD-10-CM | POA: Diagnosis not present

## 2021-07-10 DIAGNOSIS — R531 Weakness: Secondary | ICD-10-CM | POA: Diagnosis not present

## 2021-07-10 DIAGNOSIS — E876 Hypokalemia: Secondary | ICD-10-CM | POA: Diagnosis not present

## 2021-07-10 DIAGNOSIS — R932 Abnormal findings on diagnostic imaging of liver and biliary tract: Secondary | ICD-10-CM

## 2021-07-10 DIAGNOSIS — R509 Fever, unspecified: Secondary | ICD-10-CM | POA: Diagnosis not present

## 2021-07-10 DIAGNOSIS — R634 Abnormal weight loss: Secondary | ICD-10-CM

## 2021-07-10 DIAGNOSIS — D509 Iron deficiency anemia, unspecified: Secondary | ICD-10-CM

## 2021-07-10 DIAGNOSIS — Z803 Family history of malignant neoplasm of breast: Secondary | ICD-10-CM

## 2021-07-10 DIAGNOSIS — N189 Chronic kidney disease, unspecified: Secondary | ICD-10-CM

## 2021-07-10 DIAGNOSIS — Z800 Family history of malignant neoplasm of digestive organs: Secondary | ICD-10-CM

## 2021-07-10 DIAGNOSIS — R001 Bradycardia, unspecified: Secondary | ICD-10-CM | POA: Diagnosis not present

## 2021-07-10 DIAGNOSIS — Z801 Family history of malignant neoplasm of trachea, bronchus and lung: Secondary | ICD-10-CM

## 2021-07-10 DIAGNOSIS — N401 Enlarged prostate with lower urinary tract symptoms: Secondary | ICD-10-CM

## 2021-07-10 DIAGNOSIS — R6510 Systemic inflammatory response syndrome (SIRS) of non-infectious origin without acute organ dysfunction: Secondary | ICD-10-CM | POA: Diagnosis not present

## 2021-07-10 DIAGNOSIS — D375 Neoplasm of uncertain behavior of rectum: Secondary | ICD-10-CM

## 2021-07-10 DIAGNOSIS — I10 Essential (primary) hypertension: Secondary | ICD-10-CM | POA: Diagnosis not present

## 2021-07-10 LAB — DIC (DISSEMINATED INTRAVASCULAR COAGULATION)PANEL
D-Dimer, Quant: 1.73 ug/mL-FEU — ABNORMAL HIGH (ref 0.00–0.50)
Fibrinogen: 351 mg/dL (ref 210–475)
INR: 2.7 — ABNORMAL HIGH (ref 0.8–1.2)
Platelets: 429 10*3/uL — ABNORMAL HIGH (ref 150–400)
Prothrombin Time: 28.4 seconds — ABNORMAL HIGH (ref 11.4–15.2)
Smear Review: NONE SEEN
aPTT: 42 seconds — ABNORMAL HIGH (ref 24–36)

## 2021-07-10 LAB — APTT: aPTT: 45 seconds — ABNORMAL HIGH (ref 24–36)

## 2021-07-10 LAB — PROTIME-INR
INR: 2.7 — ABNORMAL HIGH (ref 0.8–1.2)
Prothrombin Time: 28.4 seconds — ABNORMAL HIGH (ref 11.4–15.2)

## 2021-07-10 MED ORDER — SACCHAROMYCES BOULARDII 250 MG PO CAPS
250.0000 mg | ORAL_CAPSULE | Freq: Two times a day (BID) | ORAL | Status: DC
  Administered 2021-07-10 – 2021-07-13 (×7): 250 mg via ORAL
  Filled 2021-07-10 (×7): qty 1

## 2021-07-10 MED ORDER — POLYETHYLENE GLYCOL 3350 17 G PO PACK
17.0000 g | PACK | Freq: Every day | ORAL | Status: DC
  Administered 2021-07-12: 17 g via ORAL
  Filled 2021-07-10 (×4): qty 1

## 2021-07-10 MED ORDER — IOHEXOL 300 MG/ML  SOLN
100.0000 mL | Freq: Once | INTRAMUSCULAR | Status: AC
  Administered 2021-07-10: 100 mL via INTRAVENOUS

## 2021-07-10 NOTE — Progress Notes (Signed)
PROGRESS NOTE    Shane Lam  VXB:[id number] DOB: 1950-12-20 DOA: 07/01/2021 PCP: [name], MD    Brief Narrative:  Mr. Shane Lam was admitted to the hospital with the working diagnosis of systemic inflammatory response syndrome, in the setting of suspected metastatic liver cancer.    70 year old male past medical history for hypertension, BPH, and dyslipidemia who presented with fever and generalized weakness.  Reported 5 to 6 days of fever, generalized weakness, malaise and fatigue.  Poor oral intake with no dyspnea or chest pain.  Because of persistent symptoms on 06/30/2021 he presented to the ED, work-up included a CT of the abdomen showing diffuse heterogeneous liver parenchyma changes suggesting numerous low-density lesions. He was discharged home with instructions to follow-up as an outpatient.   At home he had recurrent fever that prompted him to come back to the emergency department.  On his initial physical examination he was febrile 102.3 F, blood pressure 120/80, heart rate 97, respiratory rate 17, oxygen saturation 98%, his lungs were clear to auscultation bilaterally, heart S1-S2, present, rhythmic, his abdomen was soft, no lower extremity edema.   Sodium 130, potassium 4.2, chloride 96, bicarb 26, glucose 1 7, BUN 15, creatinine 1.43, albumin 3.2, lactic acid 3.0, white count 17.3, hemoglobin 13.2, hematocrit 39.8, platelets 246. SARS COVID-19 negative.   Urinalysis specific gravity 1.003, negative nitrates negative leukocytes.   Chest radiograph, no infiltrates.   EKG 92 bpm, left axis deviation, normal intervals, sinus rhythm, Q-wave V1-V2, no significant ST segment or T wave changes.   Further work-up with abdominal MRI showed innumerable lesions throughout the liver appearing to be diffusion positive.  Findings worrisome for metastatic disease.  8 mm cyst in the head body junction region of the pancreas. Partially circumferential rectal mass.   Patient initially  placed on antibiotic therapy. Sepsis was ruled and antibiotics were discontinued.     Gastroenterology was consulted, on 07/03/2021 patient underwent flexible sigmoidoscopy which was negative.   IR has been consulted for a possible image guided biopsy of liver lesions. Biopsy has been delayed due to elevated INR.   Persistent coagulopathy despite vitamin K administration.   Assessment & Plan:   Principal Problem:   Systemic inflammatory response syndrome (HCC) Active Problems:   Protein-calorie malnutrition, severe   Malignant neoplasm metastatic to liver (HCC)   Hyponatremia   Hypokalemia   Essential hypertension   Bradycardia   Systemic inflammatory response. In the setting of metastatic liver lesions/ coagulopathy.  Inflammatory response likely due to malignancy, no signs of bacterial infection.  Suspected metastatic disease to the liver, pending biopsy, that has been postponed due to coagulopathy.   INR continue to be elevated despite vitamin K administration.  Not able to perform liver biopsy until INR is 1,5 Possible coagulation factor inhibitor or deficiency. Hepatitis B non reactive, hep C pending result.   Will order mixing study and consulted Dr Benay Spice for hematology for further recommendations.    2. Acute metabolic encephalopathy  Clinically resolved.    3. CKD stage 2, hponatremia, hypokalemia,  Patient tolerating po well, will continue close follow up on renal function and electrolytes.    4. HTN/ bradycardia, dyslipidemia  Blood pressure 127/92, his heart rate has improved, now 60 to 80.  Continue with spironolactone and irbesartan.    5. Chronic anemia, severe calorie protein malnutrition, reactive leukocytosis.  Continue with nutritional supplementation. Follow cell count in am.    Status is: Inpatient  Remains inpatient appropriate because: coagulopathy   DVT prophylaxis:  Scd  Code Status:    full  Family Communication:   I spoke with patient's  wife at the bedside, we talked in detail about patient's condition, plan of care and prognosis and all questions were addressed.      Nutrition Status: Nutrition Problem: Severe Malnutrition Etiology: chronic illness (possible liver cancer) Signs/Symptoms: severe fat depletion, moderate muscle depletion, percent weight loss Percent weight loss: 8 % Interventions: MVI, Other (Comment) [employer])     Consultants:  Hematology    Subjective: Patient continue to be very weak and deconditioned, no abdominal pain, no nausea or vomiting no dyspnea or chest pain,   Objective: Vitals:   07/09/21 0748 07/09/21 2010 07/10/21 0427 07/10/21 0739  BP: 139/90 117/84 (!) 127/92 (!) 125/93  Pulse: (!) 57 83 80 66  Resp: 17 17 18 16   Temp: 98.3 F (36.8 C) 98.5 F (36.9 C) 98.4 F (36.9 C) 98.3 F (36.8 C)  TempSrc: Oral Oral Oral   SpO2: 100% 100% 100% 100%  Weight:      Height:        Intake/Output Summary (Last 24 hours) at 07/10/2021 0902 Last data filed at 07/09/2021 1851 Gross per 24 hour  Intake 480 ml  Output 725 ml  Net -245 ml   Filed Weights   07/05/21 1911  Weight: 69.9 kg    Examination:   General: Not in pain or dyspnea. Deconditioned  Neurology: Awake and alert, non focal  E ENT: no pallor, no icterus, oral mucosa moist Cardiovascular: No JVD. S1-S2 present, rhythmic, no gallops, rubs, or murmurs. No lower extremity edema. Pulmonary: vesicular breath sounds bilaterally, adequate air movement, no wheezing, rhonchi or rales. Gastrointestinal. Abdomen soft and non tender.  Skin. No rashes Musculoskeletal: no joint deformities     Data Reviewed: I have personally reviewed following labs and imaging studies  CBC: Recent Labs  Lab 07/06/21 0038 07/06/21 0838 07/07/21 0224 07/08/21 0159 07/09/21 0135  WBC 13.5* 11.3* 10.8* 10.6* 12.1*  HGB 12.6* 12.8* 11.8* 11.2* 11.5*  HCT 36.8* 37.4* 35.5* 33.8* 34.2*  MCV 86.6 87.6 87.9 87.8 88.8  PLT 261 284  306 340 081   Basic Metabolic Panel: Recent Labs  Lab 07/05/21 0718 07/06/21 0616 07/06/21 0838 07/07/21 0224 07/08/21 0159 07/09/21 0135  NA 136 135 137 136 137 134*  K 3.3* 3.5 3.4* 3.8 3.6 4.0  CL 105 106 106 109 106 104  CO2 23 24 24 22 25 26   GLUCOSE 103* 107* 105* 102* 94 98  BUN 19 18 19 21 16 23   CREATININE 1.18 1.07 1.29* 1.18 1.08 1.32*  CALCIUM 8.5* 8.4* 8.6* 8.6* 8.5* 8.8*  MG 2.2  --   --   --  2.0  --   PHOS 2.5  --   --   --  3.3  --    GFR: Estimated Creatinine Clearance: 49.8 mL/min (A) (by C-G formula based on SCr of 1.32 mg/dL (H)). Liver Function Tests: Recent Labs  Lab 07/05/21 0718 07/06/21 0838  AST 36 22  ALT 34 28  ALKPHOS 90 71  BILITOT 0.9 1.0  PROT 5.8* 5.3*  ALBUMIN 2.5* 2.4*   No results for input(s): LIPASE, AMYLASE in the last 168 hours. Recent Labs  Lab 07/06/21 0838  AMMONIA 13   Coagulation Profile: Recent Labs  Lab 07/07/21 0224 07/07/21 1634 07/08/21 0159 07/09/21 0135 07/09/21 1500  INR 2.9* 3.2* 2.8* 2.4* 3.9*   Cardiac Enzymes: No results for input(s): CKTOTAL, CKMB, CKMBINDEX, TROPONINI in  the last 168 hours. BNP (last 3 results) No results for input(s): PROBNP in the last 8760 hours. HbA1C: No results for input(s): HGBA1C in the last 72 hours. CBG: No results for input(s): GLUCAP in the last 168 hours. Lipid Profile: No results for input(s): CHOL, HDL, LDLCALC, TRIG, CHOLHDL, LDLDIRECT in the last 72 hours. Thyroid Function Tests: Recent Labs    07/07/21 1000  TSH 1.799   Anemia Panel: Recent Labs    07/07/21 1000  [place]      Radiology Studies: I have reviewed all of the imaging during this hospital visit personally     Scheduled Meds:  feeding supplement (KATE FARMS STANDARD 1.4)  325 mL Oral BID BM   irbesartan  75 mg Oral Daily   multivitamin with minerals  1 tablet Oral Daily   spironolactone  25 mg Oral Daily   Continuous Infusions:   LOS: 9 days        Leannah Guse  Gerome Apley, MD

## 2021-07-10 NOTE — Progress Notes (Signed)
Physical Therapy Treatment Patient Details Name: Shane Lam MRN: [id number] DOB: 27-Mar-1951 Today's Date: 07/10/2021   History of Present Illness Shane Lam is a 70 y.o. male with medical history significant of HTN, BPH, HLD came with new onset of fever and generalized weakness and weight loss; Sepsis; Imaging reveals metastatic liver Ca    PT Comments    Patient progressing well with mobility. Session focused on stair training and higher level balance challenges. Scored 20/24 on DGI indicating pt is not at increased risk for falls. Mod I to stand from all surfaces and supervision initially progressing to Mod I for ambulation. Tolerated walking backwards, changes in gait speed, turns etc with only mild deviations in gait but no overt LOB. Encouraged walking with mobility tech and walking to bathroom daily while in the hospital to maintain strength/mobility. Does not require any DME for walking due to improvement. Pt does not require further skilled acute PT while in the hospital as pt has met all goals and is functioning close to baseline.  All education completed. Discharge from therapy.   Recommendations for follow up therapy are one component of a multi-disciplinary discharge planning process, led by the attending physician.  Recommendations may be updated based on patient status, additional functional criteria and insurance authorization.  Follow Up Recommendations  Home health PT     Assistance Recommended at Discharge    Equipment Recommendations  BSC/3in1    Recommendations for Other Services       Precautions / Restrictions Precautions Precautions: Fall Restrictions Weight Bearing Restrictions: No     Mobility  Bed Mobility Overal bed mobility: Needs Assistance Bed Mobility: Supine to Sit;Sit to Supine     Supine to sit: Modified independent (Device/Increase time);HOB elevated Sit to supine: Modified independent (Device/Increase time);HOB elevated   General  bed mobility comments: HOB minimally elevated, no assist needed.    Transfers Overall transfer level: Needs assistance Equipment used: None Transfers: Sit to/from Stand Sit to Stand: Modified independent (Device/Increase time)           General transfer comment: Stood from EOB without difficulty. No LOB.    Ambulation/Gait Ambulation/Gait assistance: Supervision;Modified independent (Device/Increase time) Gait Distance (Feet): 850 Feet Assistive device: None Gait Pattern/deviations: Step-through pattern;Decreased stride length;Narrow base of support Gait velocity: varies Gait velocity interpretation: 1.31 - 2.62 ft/sec, indicative of limited community ambulator   General Gait Details: Mostly steady gait with mild drifting noted at times but no overt LOB, walked up/down simulated ramp and performed higher level balance activities- see balance section for details.   Stairs Stairs: Yes Stairs assistance: Supervision Stair Management: One rail Left;Alternating pattern Number of Stairs: 10 General stair comments: Cues for safety, use of rail for support.   Wheelchair Mobility    Modified Rankin (Stroke Patients Only)       Balance Overall balance assessment: Needs assistance Sitting-balance support: No upper extremity supported;Feet supported Sitting balance-Leahy Scale: Good     Standing balance support: During functional activity Standing balance-Leahy Scale: Fair                   Standardized Balance Assessment Standardized Balance Assessment : Dynamic Gait Index   Dynamic Gait Index Level Surface: Normal Change in Gait Speed: Normal Gait with Horizontal Head Turns: Mild Impairment Gait with Vertical Head Turns: Normal Gait and Pivot Turn: Mild Impairment Step Over Obstacle: Mild Impairment Step Around Obstacles: Normal Steps: Mild Impairment Total Score: 20      Cognition Arousal/Alertness: Awake/alert Behavior During  Therapy: Flat  affect Overall Cognitive Status: Impaired/Different from baseline Area of Impairment: Safety/judgement                         Safety/Judgement: Decreased awareness of safety     General Comments: Reports he feels at 100% despite wife disagreeing and mild balance deficits. "I am great" Flat affect.        Exercises      General Comments General comments (skin integrity, edema, etc.): Wife present during session.      Pertinent Vitals/Pain Pain Assessment: No/denies pain    Home Living                          Prior Function            PT Goals (current goals can now be found in the care plan section) Progress towards PT goals: Goals met/education completed, patient discharged from PT    Frequency    Min 3X/week      PT Plan Current plan remains appropriate    Co-evaluation              AM-PAC PT "6 Clicks" Mobility   Outcome Measure  Help needed turning from your back to your side while in a flat bed without using bedrails?: None Help needed moving from lying on your back to sitting on the side of a flat bed without using bedrails?: None Help needed moving to and from a bed to a chair (including a wheelchair)?: A Little Help needed standing up from a chair using your arms (e.g., wheelchair or bedside chair)?: A Little Help needed to walk in hospital room?: A Little Help needed climbing 3-5 steps with a railing? : A Little 6 Click Score: 20    End of Session Equipment Utilized During Treatment: Gait belt Activity Tolerance: Patient tolerated treatment well Patient left: in bed;with call bell/phone within reach;with family/visitor present Nurse Communication: Mobility status PT Visit Diagnosis: Unsteadiness on feet (R26.81);Other abnormalities of gait and mobility (R26.89)     Time: 8250-5397 PT Time Calculation (min) (ACUTE ONLY): 17 min  Charges:  $Neuromuscular Re-education: 8-22 mins                     Marisa Severin, PT,  DPT Acute Rehabilitation Services Pager [phone] Office [place] 07/10/2021, 3:16 PM

## 2021-07-10 NOTE — Consult Note (Addendum)
[place]  Telephone:([phone] Fax:(336) [phone]    [place]  Referring MD:  Dr. Sander Radon  Reason for Referral: Coagulopathy, liver lesions  HPI: Mr. Paynter is a 70 year old male with a past medical history significant for hypertension, BPH, hyperlipidemia.  He came to the emergency department due to fever, generalized weakness, weight loss.  He developed a fever about 5 to 6 days prior to admission with generalized weakness, malaise, and fatigue.  He was having fevers up to 102-103 and anorexia.  He was seen in the emergency department 1 day prior to this admission for evaluation of his fever.  No obvious source was identified.  But due to ongoing fever elevations, he came to the emergency department again.  He reportedly has lost about 50 pounds over the past year.  In the emergency department, his fever was 102.3 his WBC was 17.3, creatinine 1.4, and lactic acid 3.0.  He had a CT of the abdomen/pelvis on 06/30/2021 which showed diffusely heterogeneous liver parenchyma with suggestion of innumerable low-density lesions and an enlarged prostate gland with nodular projection into the bladder base.  A PSA was obtained on 06/30/2021 and was normal at 0.51.  MRI of the abdomen was performed on 07/01/2021 which showed innumerable lesions throughout the liver which appear to be diffusion positive and worrisome for metastatic disease, 8 mm cyst in the head body junction region of the pancreas.  There is a mention on the MRI report that CT scan could not exclude the possibility of a partially circumferential rectal mass.  An AFP was obtained on 07/01/2021 and was normal at 1.4.  He underwent flex sigmoidoscopy on 07/03/2021 which showed that the entire examined colon was normal and no specimens were collected.  A CT chest was obtained earlier today which showed no definite findings to suggest metastatic disease to the lungs, previously noted 4 mm subpleural  nodule in the right lower lobe is stable compared to prior study and favored to represent a benign subpleural lymph node.  IR has been consulted for an ultrasound-guided biopsy of one of his liver lesions but this has been delayed due to an elevated INR. On admission, the patient's INR was 1.4 and has been as high as 3.9.  It has continued to be elevated despite vitamin K administration.  I met with the patient and his wife and son in his hospital room.  He has not had any fevers in the past 24 hours.  He started having fevers on the evening of 06/26/2021.  Since that time, he has also had weakness and anorexia.  The patient has lost a total of 50 pounds over the past year but his wife states that the first 30 pounds were intentional and occurred because of diet change and exercise.  However, the most recent 20 pounds were unintentional weight loss.  The patient's wife reports night sweats on 1 occasion when he had a fever.  He otherwise has no complaints and currently denies headaches, dizziness, chest pain, shortness of breath, abdominal pain, nausea, vomiting, constipation, diarrhea.  He is not having any melena, hematochezia, hematuria.  The patient is married and has 1 son.  He denies history of alcohol tobacco use.  Family history significant for father with pancreatic cancer, mother with breast cancer, and maternal aunt with lung cancer.  Medical oncology was asked see the patient make recommendations regarding his coagulopathy and liver lesions.  Past Medical History:  Diagnosis Date   Allergic rhinitis  due to pollen    Essential hypertension, malignant    Nonspecific abnormal electrocardiogram (ECG) (EKG)    Pure hypercholesterolemia   :  Past Surgical History:  Procedure Laterality Date   FLEXIBLE SIGMOIDOSCOPY N/A 07/03/2021   Procedure: FLEXIBLE SIGMOIDOSCOPY;  Surgeon: Carol Ada, MD;  Location: [facility];  Service: Endoscopy;  Laterality: N/A;  :  CURRENT MEDS: Current  Facility-Administered Medications  Medication Dose Route Frequency Provider Last Rate Last Admin   acetaminophen (TYLENOL) tablet 650 mg  650 mg Oral Q6H PRN Howerter, Justin B, DO   650 mg at 07/04/21 1244   feeding supplement (KATE FARMS STANDARD 1.4) liquid 325 mL  325 mL Oral BID BM Irene Pap N, DO   325 mL at 07/10/21 8786   irbesartan (AVAPRO) tablet 75 mg  75 mg Oral Daily Kayleen Memos, DO   75 mg at 07/10/21 7672   multivitamin with minerals tablet 1 tablet  1 tablet Oral Daily Kayleen Memos, DO   1 tablet at 07/10/21 0938   ondansetron (ZOFRAN) injection 4 mg  4 mg Intravenous Q6H PRN Irene Pap N, DO   4 mg at 07/03/21 1632   polyethylene glycol (MIRALAX / GLYCOLAX) packet 17 g  17 g Oral Daily Arrien, Jimmy Picket, MD       saccharomyces boulardii (FLORASTOR) capsule 250 mg  250 mg Oral BID Arrien, Jimmy Picket, MD       spironolactone (ALDACTONE) tablet 25 mg  25 mg Oral Daily Irene Pap N, DO   25 mg at 07/10/21 0947   Allergies  Allergen Reactions   Tomato Other (See Comments)    Raw whole tomatoes  :  Family History  Problem Relation Age of Onset   CAD Mother    Brain cancer Mother    CAD Father    Pancreatic cancer Father    Lung cancer Maternal Aunt   : Social History   Socioeconomic History   Marital status: Married    Spouse name: Not on file   Number of children: Not on file   Years of education: Not on file   Highest education level: Not on file  Occupational History   Not on file  Tobacco Use   Smoking status: Never   Smokeless tobacco: Never  Vaping Use   Vaping Use: Never used  Substance and Sexual Activity   Alcohol use: Never   Drug use: Never   Sexual activity: Not on file  Other Topics Concern   Not on file  Social History Narrative   Not on file   Social Determinants of Health   Financial Resource Strain: Not on file  Food Insecurity: Not on file  Transportation Needs: Not on file  Physical Activity: Not on file   Stress: Not on file  Social Connections: Not on file  Intimate Partner Violence: Not on file  :  REVIEW OF SYSTEMS:  A comprehensive 14 point review of systems was negative except as noted in the HPI.    Exam: Patient Vitals for the past 24 hrs:  BP Temp Temp src Pulse Resp SpO2  07/10/21 0739 (!) 125/93 98.3 F (36.8 C) -- 66 16 100 %  07/10/21 0427 (!) 127/92 98.4 F (36.9 C) Oral 80 18 100 %  07/09/21 2010 117/84 98.5 F (36.9 C) Oral 83 17 100 %    General:  well-nourished in no acute distress.   Eyes:  no scleral icterus.   ENT:  There were no oropharyngeal lesions.  Lymphatics: Shotty cervical and axillary nodes, no supraclavicular Nodes Respiratory: lungs were clear bilaterally without wheezing or crackles.   Cardiovascular:  Regular rate and rhythm, S1/S2, without murmur, rub or gallop.  There was no pedal edema.   GI:  abdomen was soft, flat, nontender, nondistended, without organomegaly.   Musculoskeletal: Strength symmetrical in the upper and lower extremities. Skin: He has an ecchymotic area on his left back following a fall.   Neuro exam was nonfocal.  Patient was alert and oriented.  Attention was good.   Language was appropriate.  Mood was normal without depression.  Speech was not pressured.  Thought content was not tangential.   GU: Testes without mass  LABS:  Lab Results  Component Value Date   WBC 12.1 (H) 07/09/2021   HGB 11.5 (L) 07/09/2021   HCT 34.2 (L) 07/09/2021   PLT 371 07/09/2021   GLUCOSE 98 07/09/2021   CHOL 101 07/05/2021   TRIG 75 07/05/2021   HDL 18 (L) 07/05/2021   LDLCALC 68 07/05/2021   ALT 28 07/06/2021   AST 22 07/06/2021   NA 134 (L) 07/09/2021   K 4.0 07/09/2021   CL 104 07/09/2021   CREATININE 1.32 (H) 07/09/2021   BUN 23 07/09/2021   CO2 26 07/09/2021   INR 2.7 (H) 07/10/2021   HGBA1C 5.6 07/05/2021    DG Chest 2 View  Result Date: 06/30/2021 CLINICAL DATA:  Fever. EXAM: CHEST - 2 VIEW COMPARISON:  Radiograph  10/10/2006 FINDINGS: The cardiomediastinal contours are normal. Minor right middle lobe atelectasis. Pulmonary vasculature is normal. No consolidation, pleural effusion, or pneumothorax. Thoracic spondylosis with endplate spurring. No acute osseous abnormalities are seen. IMPRESSION: Minor right middle lobe atelectasis.  No evidence of pneumonia Electronically Signed   By: Keith Rake M.D.   On: 06/30/2021 21:08   CT Head Wo Contrast  Result Date: 06/30/2021 CLINICAL DATA:  Fever started on Friday, Seen at [facility] and cleared without anything abnormal. Covid test x 3 which have been negative, yesterday nausea, today lack of appetite and more sleepy. EXAM: CT HEAD WITHOUT CONTRAST TECHNIQUE: Contiguous axial images were obtained from the base of the skull through the vertex without intravenous contrast. COMPARISON:  None. FINDINGS: Brain: No evidence of acute infarction, hemorrhage, hydrocephalus, extra-axial collection or mass lesion/mass effect. Vascular: No hyperdense vessel or unexpected calcification. Skull: Normal. Negative for fracture or focal lesion. Sinuses/Orbits: Globes and orbits are unremarkable. Visualized sinuses are clear. Other: None. IMPRESSION: Normal unenhanced CT scan of the brain. Electronically Signed   By: Lajean Manes M.D.   On: 06/30/2021 21:55   CT CHEST W CONTRAST  Result Date: 07/10/2021 CLINICAL DATA:  70 year old male with history of cancer of unknown primary origin. EXAM: CT CHEST WITH CONTRAST TECHNIQUE: Multidetector CT imaging of the chest was performed during intravenous contrast administration. CONTRAST:  162m OMNIPAQUE IOHEXOL 300 MG/ML  SOLN COMPARISON:  Cardiac CT 11/12/2020.  No prior chest CT. FINDINGS: Cardiovascular: Heart size is normal. There is no significant pericardial fluid, thickening or pericardial calcification. There is aortic atherosclerosis, as well as atherosclerosis of the great vessels of the mediastinum and the coronary arteries, including  calcified atherosclerotic plaque in the left main, left anterior descending, left circumflex and right coronary arteries. Mediastinum/Nodes: No pathologically enlarged mediastinal or hilar lymph nodes. Esophagus is unremarkable in appearance. No axillary lymphadenopathy. Lungs/Pleura: 4 mm right middle lobe nodule associated with the minor fissure (axial image 87 of series 4), stable compared to the prior study, likely a benign  subpleural lymph node. No other definite suspicious appearing pulmonary nodules or masses are noted. No acute consolidative airspace disease. No pleural effusions. Upper Abdomen: 2.5 cm low-attenuation lesion in the upper pole of the right kidney is compatible with a simple cyst. Multiple hepatic lesions noted on recent abdominal MRI are not confidently identified on today's CT examination (please see dictation for abdominal MRI 07/01/2021 for full description of these findings). Musculoskeletal: There are no aggressive appearing lytic or blastic lesions noted in the visualized portions of the skeleton. IMPRESSION: 1. No definite findings to suggest metastatic disease to the lungs. 2. Previously noted 4 mm subpleural nodule in the right middle lobe is stable compared to the prior study, strongly favored to represent a benign subpleural lymph node. 3. Aortic atherosclerosis, in addition to left main and 3 vessel coronary artery disease. Please note that although the presence of coronary artery calcium documents the presence of coronary artery disease, the severity of this disease and any potential stenosis cannot be assessed on this non-gated CT examination. Assessment for potential risk factor modification, dietary therapy or pharmacologic therapy may be warranted, if clinically indicated. Aortic Atherosclerosis (ICD10-I70.0). Electronically Signed   By: Vinnie Langton M.D.   On: 07/10/2021 07:45   MR Abdomen W or Wo Contrast  Result Date: 07/01/2021 CLINICAL DATA:  Liver lesions seen  on CT scan. EXAM: MRI ABDOMEN WITHOUT AND WITH CONTRAST TECHNIQUE: Multiplanar multisequence MR imaging of the abdomen was performed both before and after the administration of intravenous contrast. CONTRAST:  21m GADAVIST GADOBUTROL 1 MMOL/ML IV SOLN COMPARISON:  CT scan 06/30/2021 FINDINGS: Examination is quite limited due to respiratory motion. The patient could not hold his breath for this examination. Lower chest: The lung bases grossly clear. No worrisome pulmonary lesions. No pleural or pericardial effusion. Hepatobiliary: Innumerable lesions throughout the spleen appear to be diffusion positive. No intrahepatic biliary dilatation. Normal caliber and course of the common bile duct. The gallbladder is unremarkable. Pancreas: 8 mm cyst is noted in the head body junction region the pancreas as seen on the CT scan. No worrisome MR imaging features. No ductal dilatation. Spleen:  Normal size.  Small splenic cyst noted. Adrenals/Urinary Tract: The adrenal glands are unremarkable. Simple appearing renal cysts bilaterally. The largest cyst is on the right side and measures 10 cm. Stomach/Bowel: The stomach, duodenum, visualized small visualized colon are grossly. On the CT scan could not exclude the possibility of a partially circumferential rectal mass. Recommend correlation with rectal exam. Vascular/Lymphatic: No aortic aneurysm or dissection. The major venous structures are patent. No mesenteric or retroperitoneal mass or adenopathy. Other:  No ascites or abdominal wall hernia. Musculoskeletal: 2 no significant bony findings. IMPRESSION: 1. Examination is quite limited due to respiratory motion. 2. Innumerable lesions throughout the liver appear to be diffusion positive. Findings worrisome for metastatic disease. 3. 8 mm cyst in the head body junction region the pancreas. No worrisome MR imaging features. Recommend follow-up MRI abdomen without and with contrast in 1 year. 4. On the CT scan could not exclude the  possibility of a partially circumferential rectal mass. Recommend correlation with rectal exam. 5. PET-CT may be helpful for further evaluation. Electronically Signed   By: PMarijo SanesM.D.   On: 07/01/2021 16:07   CT ABDOMEN PELVIS W CONTRAST  Result Date: 06/30/2021 CLINICAL DATA:  Abdominal pain, fever and unintended weight loss. Concern for intra-abdominal malignancy. EXAM: CT ABDOMEN AND PELVIS WITH CONTRAST TECHNIQUE: Multidetector CT imaging of the abdomen and pelvis was  performed using the standard protocol following bolus administration of intravenous contrast. CONTRAST:  119m OMNIPAQUE IOHEXOL 300 MG/ML  SOLN COMPARISON:  Abdominal ultrasound 09/25/2018. Abdominal CT 01/22/2009 FINDINGS: Lower chest: Tiny platelet subpleural opacity in the right lower lobe which was faintly visualized on 2010 exam, considered benign. No basilar pulmonary nodule, effusion or focal airspace disease. The heart is normal in size. Hepatobiliary: The liver parenchyma is diffusely heterogeneous with suggestion of innumerable low-density lesions. Largest suspected lesion in the left lobe measures 12 mm, series 2, image 15. nondistended gallbladder. No calcified gallstone or pericholecystic fat stranding. Pancreas: 9 mm cyst in the proximal pancreatic body, series 2, image 25. No ductal dilatation or inflammation. Spleen: Normal in size.  12 mm low-density in the inferior spleen. Adrenals/Urinary Tract: No adrenal nodule. No hydronephrosis. There are multiple bilateral cysts within both kidneys. This includes a dominant exophytic cyst arising from the lower right kidney that measures 11.1 cm. There is no internal complexity. No solid renal lesions. Urinary bladder is partially distended, no obvious bladder abnormality. Stomach/Bowel: Detailed bowel assessment is limited in the absence of enteric contrast as well as paucity of intra-abdominal fat. Stomach is decompressed further limiting assessment. There is no small bowel  obstruction. No obvious small bowel inflammation the appendix is not discretely visualized moderate colonic stool burden. There is colonic redundancy. The splenic flexure of the colon is nondistended which limits assessment. Vascular/Lymphatic: Aortic atherosclerosis. No aortic aneurysm. Patent portal vein. No bulky abdominopelvic adenopathy, paucity of intra-abdominal fat limits assessment for adenopathy. Reproductive: Prominent prostate gland spanning 5.4 cm with nodular projection into the bladder base. Other: No ascites.  No free air.  No definite omental thickening. Musculoskeletal: Grade 1 anterolisthesis of L5 on S1. Unilateral left L5 pars defect. Slight heterogeneous appearance of the marrow but no discrete focal bone lesion. IMPRESSION: 1. Diffusely heterogeneous liver parenchyma with suggestion of innumerable low-density lesions. Recommend further characterization with hepatic protocol MRI. This should only be performed if patient is able to tolerate breath hold technique. 2. Small cyst in the pancreas has benign CT features, 9 mm. Follow-up for a pancreatic cyst of this size in 5 years, if not further evaluated. 3. Multiple bilateral renal cysts. 4. Enlarged prostate gland with nodular projection into the bladder base. Recommend correlation with PSA. Aortic Atherosclerosis (ICD10-I70.0). Electronically Signed   By: MKeith RakeM.D.   On: 06/30/2021 22:06   ECHOCARDIOGRAM COMPLETE  Result Date: 07/02/2021    ECHOCARDIOGRAM REPORT   Patient Name:   LHALIL RENTZDate of Exam: 07/02/2021 Medical Rec #:  [id number]     Height:       68.5 in Accession #:    [id number]    Weight:       154.0 lb Date of Birth:  126-Aug-1952     BSA:          1.839 m Patient Age:    89years       BP:           117/74 mmHg Patient Gender: M              HR:           64 bpm. Exam Location:  Inpatient Procedure: 2D Echo, Cardiac Doppler and Color Doppler Indications:    Enocardits  History:        Patient has no prior  history of Echocardiogram examinations.  Sonographer:    RMerrie RoofRDCS Referring Phys: [id number]  1. Left ventricular ejection fraction, by estimation, is 55 to 60%. The left ventricle has normal function. The left ventricle has no regional wall motion abnormalities. Left ventricular diastolic parameters are consistent with Grade I diastolic dysfunction (impaired relaxation).  2. Right ventricular systolic function is normal. The right ventricular size is normal. There is normal pulmonary artery systolic pressure. The estimated right ventricular systolic pressure is 45.4 mmHg.  3. Left atrial size was mildly dilated.  4. The mitral valve is normal in structure. Trivial mitral valve regurgitation. No evidence of mitral stenosis.  5. The aortic valve is tricuspid. Aortic valve regurgitation is not visualized. Mild aortic valve sclerosis is present, with no evidence of aortic valve stenosis.  6. The inferior vena cava is normal in size with greater than 50% respiratory variability, suggesting right atrial pressure of 3 mmHg.  7. No definite endocarditis visualized. The aortic valve is calcified but there is not a definite vegetation. If high suspicion, needs TEE. FINDINGS  Left Ventricle: Left ventricular ejection fraction, by estimation, is 55 to 60%. The left ventricle has normal function. The left ventricle has no regional wall motion abnormalities. The left ventricular internal cavity size was normal in size. There is  no left ventricular hypertrophy. Left ventricular diastolic parameters are consistent with Grade I diastolic dysfunction (impaired relaxation). Right Ventricle: The right ventricular size is normal. No increase in right ventricular wall thickness. Right ventricular systolic function is normal. There is normal pulmonary artery systolic pressure. The tricuspid regurgitant velocity is 2.54 m/s, and  with an assumed right atrial pressure of 3 mmHg, the estimated right  ventricular systolic pressure is 09.8 mmHg. Left Atrium: Left atrial size was mildly dilated. Right Atrium: Right atrial size was normal in size. Pericardium: There is no evidence of pericardial effusion. Mitral Valve: The mitral valve is normal in structure. Trivial mitral valve regurgitation. No evidence of mitral valve stenosis. Tricuspid Valve: The tricuspid valve is normal in structure. Tricuspid valve regurgitation is trivial. Aortic Valve: The aortic valve is tricuspid. Aortic valve regurgitation is not visualized. Mild aortic valve sclerosis is present, with no evidence of aortic valve stenosis. Aortic valve mean gradient measures 8.0 mmHg. Aortic valve peak gradient measures 16.2 mmHg. Aortic valve area, by VTI measures 1.75 cm. Pulmonic Valve: The pulmonic valve was normal in structure. Pulmonic valve regurgitation is trivial. Aorta: The aortic root is normal in size and structure. Venous: The inferior vena cava is normal in size with greater than 50% respiratory variability, suggesting right atrial pressure of 3 mmHg. IAS/Shunts: No atrial level shunt detected by color flow Doppler.  LEFT VENTRICLE PLAX 2D LVIDd:         4.70 cm   Diastology LVIDs:         3.10 cm   LV e' medial:    9.02 cm/s LV PW:         1.00 cm   LV E/e' medial:  8.7 LV IVS:        0.70 cm   LV e' lateral:   15.30 cm/s LVOT diam:     2.00 cm   LV E/e' lateral: 5.1 LV SV:         71 LV SV Index:   39 LVOT Area:     3.14 cm  RIGHT VENTRICLE RV Basal diam:  2.80 cm LEFT ATRIUM           Index        RIGHT ATRIUM  Index LA diam:      3.30 cm 1.79 cm/m   RA Area:     13.40 cm LA Vol (A2C): 59.9 ml 32.57 ml/m  RA Volume:   25.80 ml  14.03 ml/m LA Vol (A4C): 21.0 ml 11.42 ml/m  AORTIC VALVE AV Area (Vmax):    1.71 cm AV Area (Vmean):   1.74 cm AV Area (VTI):     1.75 cm AV Vmax:           201.00 cm/s AV Vmean:          128.000 cm/s AV VTI:            0.406 m AV Peak Grad:      16.2 mmHg AV Mean Grad:      8.0 mmHg LVOT  Vmax:         109.15 cm/s LVOT Vmean:        70.950 cm/s LVOT VTI:          0.226 m LVOT/AV VTI ratio: 0.56  AORTA Ao Root diam: 3.20 cm Ao Asc diam:  3.50 cm MITRAL VALVE               TRICUSPID VALVE MV Area (PHT): 3.27 cm    TR Peak grad:   25.8 mmHg MV Decel Time: 232 msec    TR Vmax:        254.00 cm/s MV E velocity: 78.70 cm/s MV A velocity: 90.80 cm/s  SHUNTS MV E/A ratio:  0.87        Systemic VTI:  0.23 m                            Systemic Diam: 2.00 cm Dalton McleanMD Electronically signed by Franki Monte Signature Date/Time: 07/02/2021/3:37:58 PM    Final    US Abdomen Limited RUQ (LIVER/GB)  Result Date: 06/30/2021 CLINICAL DATA:  Liver mass on CT. EXAM: ULTRASOUND ABDOMEN LIMITED RIGHT UPPER QUADRANT COMPARISON:  CT abdomen pelvis 06/30/2021 FINDINGS: Gallbladder: No gallstones or wall thickening visualized. No sonographic Murphy sign noted by sonographer. Common bile duct: Diameter: 4 mm. Liver: No focal hepatic lesion. Slightly heterogeneous parenchymal echogenicity. Portal vein is patent on color Doppler imaging with normal direction of blood flow towards the liver. Other: None. IMPRESSION: Slightly heterogeneous hepatic parenchyma with no sonographic finding of focal hepatic lesion. Recommend MRI liver protocol for further evaluation given CT abdomen pelvis 06/30/2021 findings. When the patient is clinically stable and able to follow directions and hold their breath (preferably as an outpatient) further evaluation with dedicated abdominal MRI should be considered. Electronically Signed   By: Iven Finn M.D.   On: 06/30/2021 23:41     ASSESSMENT AND PLAN:  1.  Coagulopathy 2.  Liver lesions -06/30/2021 CT abdomen/pelvis-diffusely heterogeneous liver parenchyma with suggestion of innumerable low-density lesions, small cyst in the pancreas, multiple bilateral renal cysts, enlarged prostate gland with nodular projection into the bladder base. -06/30/2021 PSA was 0.51 -07/01/2021 MRI of  the abdomen-innumerable lesions throughout the liver which appear to be diffusion positive and worrisome for metastatic disease -07/01/2021 AFP was 1.4 -07/10/2021 CT chest-no definite findings to suggest metastatic disease to the lungs, stable 4 mm subpleural nodule favored to be benign 3.  Fever, resolved 4.  Mild normocytic anemia 5.  Leukocytosis 6.  Enlarged prostate 7.  Pancreatic cyst, renal cysts, splenic cyst 8.  CKD 9.  Hypertension 10.  Dyslipidemia 11.  Protein calorie malnutrition/weight loss  Mr. Reas was admitted with weakness, fever, anorexia, weight loss.  He was noted to have liver lesions on imaging concerning for metastatic disease.  Imaging does not show any obvious source of a primary malignancy.  There is an enlarged prostate with a nodule projection into the bladder base but PSA is normal.  There is also suggestion of a rectal mass on MRI but no mass identified on like sigmoidoscopy.  An AFP was normal.  We will also check other tumor markers including a CEA and CA 19.9.  IR was consulted for an ultrasound-guided liver biopsy.  However, this could not be performed due to elevated INR.  He has received vitamin K on several occasions and his INR remains elevated.  Therefore, unsafe to proceed with liver biopsy to further characterize liver lesions.  His coagulopathy may be related to liver disease from the liver lesions and malnutrition.  Additional work-up has been ordered including a DIC panel and PT and PTT mixing studies.  Recommendations: 1.  Will await results of mixing studies and DIC panel. 2.  We will order CEA and CA 19.9. 3.  We can consider administration of FFP prior to a biopsy to correct his coagulopathy.  However, this may be a temporary solution to his coagulopathy. 4.  Once biopsy of the liver has been completed and results are available, we will discuss treatment options if malignancy confirmed. 5.  Stop all vitamins, alternative medications, and  nutrition supplements taken prior to hospital admission 6.  Consider infectious disease consult for recurrent fever 7.  Hematology will see him 07/11/2021  Thank you for this referral.  Mikey Bussing, DNP, AGPCNP-BC, AOCNP Mr. Creelman was interviewed and examined.  I reviewed the imaging studies.  He was admitted with a fever, weight loss, and failure to thrive.  A CT abdomen and liver MRI revealed liver lesions, possibly metastases.  No primary malignancy has been confirmed.  He had an elevated PT on hospital admission.  The PT is higher despite receiving vitamin K.  The PTT is mildly elevated.  The coagulopathy is most likely related to nutritional deficiency, liver disease, and there may be a contribution from nutrition supplements and alternative medications he was taking prior to hospital admission.  His wife reports he was being prescribed "nitric oxide "and vitamin supplements by an alternative medicine practitioner.  We will obtain PT and PTT mixing studies.  It is possible he has a lupus anticoagulant interfering with the PT and PTT, dysfibrinogenemia, or less likely a common pathway inhibitor.  I was present for greater than 50% of today's visit.  I performed medical decision making.

## 2021-07-11 DIAGNOSIS — D689 Coagulation defect, unspecified: Secondary | ICD-10-CM | POA: Diagnosis not present

## 2021-07-11 DIAGNOSIS — K769 Liver disease, unspecified: Secondary | ICD-10-CM | POA: Diagnosis not present

## 2021-07-11 DIAGNOSIS — E43 Unspecified severe protein-calorie malnutrition: Secondary | ICD-10-CM | POA: Diagnosis not present

## 2021-07-11 DIAGNOSIS — C787 Secondary malignant neoplasm of liver and intrahepatic bile duct: Secondary | ICD-10-CM | POA: Diagnosis not present

## 2021-07-11 LAB — DIFFERENTIAL
Abs Immature Granulocytes: 0.16 10*3/uL — ABNORMAL HIGH (ref 0.00–0.07)
Basophils Absolute: 0.1 10*3/uL (ref 0.0–0.1)
Basophils Relative: 1 %
Eosinophils Absolute: 1.1 10*3/uL — ABNORMAL HIGH (ref 0.0–0.5)
Eosinophils Relative: 9 %
Immature Granulocytes: 1 %
Lymphocytes Relative: 10 %
Lymphs Abs: 1.2 10*3/uL (ref 0.7–4.0)
Monocytes Absolute: 0.8 10*3/uL (ref 0.1–1.0)
Monocytes Relative: 7 %
Neutro Abs: 8.7 10*3/uL — ABNORMAL HIGH (ref 1.7–7.7)
Neutrophils Relative %: 72 %

## 2021-07-11 LAB — PT FACTOR INHIBITOR (MIXING STUDY)
PT 1:1NP: 10.2 s (ref 9.1–12.0)
PT: 11.1 s (ref 9.1–12.0)

## 2021-07-11 LAB — BASIC METABOLIC PANEL
Anion gap: 5 (ref 5–15)
BUN: 22 mg/dL (ref 8–23)
CO2: 26 mmol/L (ref 22–32)
Calcium: 8.7 mg/dL — ABNORMAL LOW (ref 8.9–10.3)
Chloride: 104 mmol/L (ref 98–111)
Creatinine, Ser: 1.22 mg/dL (ref 0.61–1.24)
GFR, Estimated: >60 mL/min (ref >60)
Glucose, Bld: 98 mg/dL (ref 70–99)
Potassium: 4.1 mmol/L (ref 3.5–5.1)
Sodium: 135 mmol/L (ref 135–145)

## 2021-07-11 LAB — CBC
HCT: 33.6 % — ABNORMAL LOW (ref 39.0–52.0)
Hemoglobin: 10.8 g/dL — ABNORMAL LOW (ref 13.0–17.0)
MCH: 29.1 pg (ref 26.0–34.0)
MCHC: 32.1 g/dL (ref 30.0–36.0)
MCV: 90.6 fL (ref 80.0–100.0)
Platelets: 424 10*3/uL — ABNORMAL HIGH (ref 150–400)
RBC: 3.71 MIL/uL — ABNORMAL LOW (ref 4.22–5.81)
RDW: 14 % (ref 11.5–15.5)
WBC: 13 10*3/uL — ABNORMAL HIGH (ref 4.0–10.5)
nRBC: 0 % (ref 0.0–0.2)

## 2021-07-11 LAB — HCV AB W REFLEX TO QUANT PCR: HCV Ab: 0.1 s/co ratio (ref 0.0–0.9)

## 2021-07-11 LAB — PTT FACTOR INHIBITOR (MIXING STUDY): aPTT: 30.2 s (ref 22.9–30.2)

## 2021-07-11 LAB — HEPATITIS B SURFACE ANTIBODY, QUANTITATIVE: Hep B S AB Quant (Post): 401.8 m[IU]/mL (ref >9.9)

## 2021-07-11 LAB — FIBRINOGEN: Fibrinogen: 409 mg/dL (ref 210–475)

## 2021-07-11 LAB — PROTIME-INR
INR: 3 — ABNORMAL HIGH (ref 0.8–1.2)
Prothrombin Time: 30.9 seconds — ABNORMAL HIGH (ref 11.4–15.2)

## 2021-07-11 LAB — HCV INTERPRETATION

## 2021-07-11 LAB — APTT: aPTT: 40 seconds — ABNORMAL HIGH (ref 24–36)

## 2021-07-11 MED ORDER — PHYTONADIONE 5 MG PO TABS
10.0000 mg | ORAL_TABLET | Freq: Every day | ORAL | Status: DC
  Administered 2021-07-11 – 2021-07-13 (×3): 10 mg via ORAL
  Filled 2021-07-11 (×3): qty 2

## 2021-07-11 NOTE — Progress Notes (Signed)
  Progress Note    Shane Lam   VPX:[id number]  DOB: [birth year]  DOA: 07/01/2021     10 Date of Service: 07/11/2021   Clinical Course 70 year old man presented with fever and generalized weakness.  Imaging revealed innumerable liver lesions.  Plan for biopsy was made back unable to proceed secondary to elevated INR.  Seen by gastroenterology, flexible sigmoidoscopy was unremarkable.  Seen by hematology, currently managing elevated INR, treating with vitamin K, further laboratory studies pending.  Assessment and Plan Innumerable liver lesions of unclear etiology and significance --Plan for liver biopsy once INR acceptable  Coagulopathy of unclear etiology --INR remains elevated.  Continue vitamin K per oncology.  Fever, unclear etiology --Resolved  Acute metabolic encephalopathy --Resolved  Severe protein calorie malnutrition --Continue supplementation  Subjective:  Feels tired, sleeps a lot, malaise. No pain Eating ok No bleeding  Objective Vitals:   07/10/21 2102 07/10/21 2102 07/11/21 0747 07/11/21 1557  BP: 121/80 121/80 (!) 131/94 114/75  Pulse: 74 73 66 73  Resp:   18 17  Temp: 98.5 F (36.9 C) 98.5 F (36.9 C) 98.4 F (36.9 C) 98.6 F (37 C)  TempSrc:   Oral Oral  SpO2: 100% 100% 99% 100%  Weight:      Height:       69.9 kg  Vital signs were reviewed and unremarkable.  Exam Physical Exam Cardiovascular:     Rate and Rhythm: Normal rate and regular rhythm.     Heart sounds: No murmur heard. Pulmonary:     Effort: Pulmonary effort is normal. No respiratory distress.     Breath sounds: No wheezing, rhonchi or rales.  Musculoskeletal:     Right lower leg: No edema.     Left lower leg: No edema.  Neurological:     Mental Status: He is alert.  Psychiatric:        Mood and Affect: Mood normal.        Behavior: Behavior normal.    Labs / Other Information My review of labs, imaging, notes and other tests is significant for INR 2.7, stable Hgb     Disposition Plan: Status is: Inpatient  Remains inpatient appropriate because: INR high, needs biopsy  Updated wife at bedside  Time spent: 25 minutes [facility] 07/11/2021, 7:08 PM

## 2021-07-11 NOTE — Hospital Course (Addendum)
70 year old man presented with fever and generalized weakness.  Imaging revealed innumerable liver lesions.   --11/2 admitted for suspected sepsis of unknown source, further evaluation of liver lesions --11/3 GI eval for possible rectal mass, not appreciated on physical examination -- 11/4 flexible sigmoidoscopy with normal colon, GI signed --11/6 interventional radiology consultation for liver biopsy --11/7-11/14 biopsy canceled secondary to coagulopathy.  Subsequently has been treated with vitamin K without significant improvement.  Seen by hematology with further studies pending.  Discussed with hematology, Dr. Benay Spice, okay for discharge today, complete total 5 days of vitamin K (2 more days), he will arrange outpatient follow-up to review pending labs, coagulopathy and possible liver biopsy.

## 2021-07-11 NOTE — Progress Notes (Signed)
  Mobility Specialist Criteria Algorithm Info.  Mobility Team: [facility] elevated:Self regulated Activity: Ambulated in hall Range of motion: Active; All extremities Level of assistance: Standby assist, set-up cues, supervision of patient - no hands on Assistive device: Front wheel walker Distance ambulated (ft): 1950 ft Mobility response: Tolerated well Bed Position: Semi-fowlers  Patient ambulated in hallway supervision level with steady gait. Tolerated ambulation well without complaint or incident. Was left lying supine in bed with all needs met.    07/11/2021 4:39 PM

## 2021-07-11 NOTE — Progress Notes (Signed)
[facility]  HEMATOLOGY/ONCOLOGY INPATIENT PROGRESS NOTE  Date of Service: 07/11/2021  Inpatient Attending: .Samuella Cota, MD   SUBJECTIVE  Patient was seen in follow-up at the request of Dr. Julieanne Manson for coagulopathy characterized by elevated PT and aPTT in a patient with indeterminate liver lesions who is being considered for liver biopsy which is on hold due to coagulopathy. Patient was seen with his wife (who is a retired Therapist, sports) at bedside. Patient notes no additional fevers.  Still has significant fatigue.  Notes improved p.o. intake. No focal abdominal pain at this time. No clinical evidence of bleeding.  No nosebleeds no gum bleeds no new petechiae or purpura no hemarthrosis.  No blood in the urine or stools. Discussed holding all his supplements. Patient's wife notes that the patient did handle some D-CON mouse and rat killer about 4 weeks ago without gloves but he does not feel he got any chemical on his hands. Wife notes that the patient had confusion previously but seems to have completely cleared up now.  OBJECTIVE:  NAD  PHYSICAL EXAMINATION: . Vitals:   07/10/21 1607 07/10/21 2102 07/10/21 2102 07/11/21 0747  BP: 122/89 121/80 121/80 (!) 131/94  Pulse: 73 74 73 66  Resp: 17   18  Temp: 98.6 F (37 C) 98.5 F (36.9 C) 98.5 F (36.9 C) 98.4 F (36.9 C)  TempSrc: Oral   Oral  SpO2: 100% 100% 100% 99%  Weight:      Height:       Filed Weights   07/05/21 1911  Weight: 154 lb 1.6 oz (69.9 kg)   .Body mass index is 23.09 kg/m.  GENERAL:alert, in no acute distress and comfortable SKIN: skin color, texture, turgor are normal, no rashes or significant lesions EYES: normal, conjunctiva are pink and non-injected, sclera clear OROPHARYNX:no exudate, no erythema and lips, buccal mucosa, and tongue normal  NECK: supple, no JVD, thyroid normal size, non-tender, without nodularity LYMPH:  no palpable lymphadenopathy in the cervical, axillary or inguinal LUNGS: clear  to auscultation with normal respiratory effort HEART: regular rate & rhythm,  no murmurs and no lower extremity edema ABDOMEN: abdomen soft, non-tender, normoactive bowel sounds  Musculoskeletal: no cyanosis of digits and no clubbing  PSYCH: alert & oriented x 3 with fluent speech NEURO: no focal motor/sensory deficits  MEDICAL HISTORY:  Past Medical History:  Diagnosis Date   Allergic rhinitis due to pollen    Essential hypertension, malignant    Nonspecific abnormal electrocardiogram (ECG) (EKG)    Pure hypercholesterolemia     SURGICAL HISTORY: Past Surgical History:  Procedure Laterality Date   FLEXIBLE SIGMOIDOSCOPY N/A 07/03/2021   Procedure: FLEXIBLE SIGMOIDOSCOPY;  Surgeon: Carol Ada, MD;  Location: [facility];  Service: Endoscopy;  Laterality: N/A;    SOCIAL HISTORY: Social History   Socioeconomic History   Marital status: Married    Spouse name: Not on file   Number of children: Not on file   Years of education: Not on file   Highest education level: Not on file  Occupational History   Not on file  Tobacco Use   Smoking status: Never   Smokeless tobacco: Never  Vaping Use   Vaping Use: Never used  Substance and Sexual Activity   Alcohol use: Never   Drug use: Never   Sexual activity: Not on file  Other Topics Concern   Not on file  Social History Narrative   Not on file   Social Determinants of Health   Financial Resource Strain:  Not on file  Food Insecurity: Not on file  Transportation Needs: Not on file  Physical Activity: Not on file  Stress: Not on file  Social Connections: Not on file  Intimate Partner Violence: Not on file    FAMILY HISTORY: Family History  Problem Relation Age of Onset   CAD Mother    Brain cancer Mother    CAD Father    Pancreatic cancer Father    Lung cancer Maternal Aunt     ALLERGIES:  has no active allergies.  MEDICATIONS:  Scheduled Meds:  feeding supplement (KATE FARMS STANDARD 1.4)  325 mL Oral BID  BM   irbesartan  75 mg Oral Daily   multivitamin with minerals  1 tablet Oral Daily   polyethylene glycol  17 g Oral Daily   saccharomyces boulardii  250 mg Oral BID   spironolactone  25 mg Oral Daily   Continuous Infusions: PRN Meds:.acetaminophen, ondansetron (ZOFRAN) IV  REVIEW OF SYSTEMS:   .10 Point review of Systems was done is negative except as noted above.    LABORATORY DATA:  I have reviewed the data as listed  . CBC Latest Ref Rng & Units 07/11/2021 07/10/2021 07/09/2021  WBC 4.0 - 10.5 K/uL 13.0(H) - 12.1(H)  Hemoglobin 13.0 - 17.0 g/dL 10.8(L) - 11.5(L)  Hematocrit 39.0 - 52.0 % 33.6(L) - 34.2(L)  Platelets 150 - 400 K/uL 424(H) 429(H) 371    . CMP Latest Ref Rng & Units 07/11/2021 07/09/2021 07/08/2021  Glucose 70 - 99 mg/dL 98 98 94  BUN 8 - 23 mg/dL 22 23 16   Creatinine 0.61 - 1.24 mg/dL 1.22 1.32(H) 1.08  Sodium 135 - 145 mmol/L 135 134(L) 137  Potassium 3.5 - 5.1 mmol/L 4.1 4.0 3.6  Chloride 98 - 111 mmol/L 104 104 106  CO2 22 - 32 mmol/L 26 26 25   Calcium 8.9 - 10.3 mg/dL 8.7(L) 8.8(L) 8.5(L)  Total Protein 6.5 - 8.1 g/dL - - -  Total Bilirubin 0.3 - 1.2 mg/dL - - -  Alkaline Phos 38 - 126 U/L - - -  AST 15 - 41 U/L - - -  ALT 0 - 44 U/L - - -   Component     Latest Ref Rng & Units 07/10/2021 07/10/2021        10:09 AM  3:22 PM  Prothrombin Time     11.4 - 15.2 seconds 28.4 (H) 28.4 (H)  INR     0.8 - 1.2 2.7 (H) 2.7 (H)  APTT     24 - 36 seconds 45 (H) 42 (H)  Fibrinogen     210 - 475 mg/dL  351  D-Dimer, Quant     0.00 - 0.50 ug/mL-FEU  1.73 (H)  Platelets     150 - 400 K/uL  429 (H)  Smear Review       NO SCHISTOCYTES SEEN  Ammonia     9 - 35 umol/L       Component     Latest Ref Rng & Units 07/10/2021 07/10/2021        10:09 AM 10:09 AM  APTT     24 - 36 seconds 45 (H) 30.2  aPTT 1:1 Normal Plasma       NOT PERFORMED  aPTT 1:1 Mix Saline       NOT PERFORMED  aPTT 1:1 NP Mix, 60 Min,Incub.       NOT PERFORMED  PT      9.1 - 12.0 sec 11.1  PT 1:1NP     9.1 - 12.0 sec 10.2   1 HR INCUB PT 1:1NP     sec NIY   Prothrombin Time     11.4 - 15.2 seconds 28.4 (H)   INR     0.8 - 1.2 2.7 (H)    RADIOGRAPHIC STUDIES: I have personally reviewed the radiological images as listed and agreed with the findings in the report. DG Chest 2 View  Result Date: 06/30/2021 CLINICAL DATA:  Fever. EXAM: CHEST - 2 VIEW COMPARISON:  Radiograph 10/10/2006 FINDINGS: The cardiomediastinal contours are normal. Minor right middle lobe atelectasis. Pulmonary vasculature is normal. No consolidation, pleural effusion, or pneumothorax. Thoracic spondylosis with endplate spurring. No acute osseous abnormalities are seen. IMPRESSION: Minor right middle lobe atelectasis.  No evidence of pneumonia Electronically Signed   By: Keith Rake M.D.   On: 06/30/2021 21:08   CT Head Wo Contrast  Result Date: 06/30/2021 CLINICAL DATA:  Fever started on Friday, Seen at [facility] and cleared without anything abnormal. Covid test x 3 which have been negative, yesterday nausea, today lack of appetite and more sleepy. EXAM: CT HEAD WITHOUT CONTRAST TECHNIQUE: Contiguous axial images were obtained from the base of the skull through the vertex without intravenous contrast. COMPARISON:  None. FINDINGS: Brain: No evidence of acute infarction, hemorrhage, hydrocephalus, extra-axial collection or mass lesion/mass effect. Vascular: No hyperdense vessel or unexpected calcification. Skull: Normal. Negative for fracture or focal lesion. Sinuses/Orbits: Globes and orbits are unremarkable. Visualized sinuses are clear. Other: None. IMPRESSION: Normal unenhanced CT scan of the brain. Electronically Signed   By: Lajean Manes M.D.   On: 06/30/2021 21:55   CT CHEST W CONTRAST  Result Date: 07/10/2021 CLINICAL DATA:  70 year old male with history of cancer of unknown primary origin. EXAM: CT CHEST WITH CONTRAST TECHNIQUE: Multidetector CT imaging of the chest was performed  during intravenous contrast administration. CONTRAST:  150mL OMNIPAQUE IOHEXOL 300 MG/ML  SOLN COMPARISON:  Cardiac CT 11/12/2020.  No prior chest CT. FINDINGS: Cardiovascular: Heart size is normal. There is no significant pericardial fluid, thickening or pericardial calcification. There is aortic atherosclerosis, as well as atherosclerosis of the great vessels of the mediastinum and the coronary arteries, including calcified atherosclerotic plaque in the left main, left anterior descending, left circumflex and right coronary arteries. Mediastinum/Nodes: No pathologically enlarged mediastinal or hilar lymph nodes. Esophagus is unremarkable in appearance. No axillary lymphadenopathy. Lungs/Pleura: 4 mm right middle lobe nodule associated with the minor fissure (axial image 87 of series 4), stable compared to the prior study, likely a benign subpleural lymph node. No other definite suspicious appearing pulmonary nodules or masses are noted. No acute consolidative airspace disease. No pleural effusions. Upper Abdomen: 2.5 cm low-attenuation lesion in the upper pole of the right kidney is compatible with a simple cyst. Multiple hepatic lesions noted on recent abdominal MRI are not confidently identified on today's CT examination (please see dictation for abdominal MRI 07/01/2021 for full description of these findings). Musculoskeletal: There are no aggressive appearing lytic or blastic lesions noted in the visualized portions of the skeleton. IMPRESSION: 1. No definite findings to suggest metastatic disease to the lungs. 2. Previously noted 4 mm subpleural nodule in the right middle lobe is stable compared to the prior study, strongly favored to represent a benign subpleural lymph node. 3. Aortic atherosclerosis, in addition to left main and 3 vessel coronary artery disease. Please note that although the presence of coronary artery calcium documents the presence of coronary artery disease, the  severity of this disease  and any potential stenosis cannot be assessed on this non-gated CT examination. Assessment for potential risk factor modification, dietary therapy or pharmacologic therapy may be warranted, if clinically indicated. Aortic Atherosclerosis (ICD10-I70.0). Electronically Signed   By: Vinnie Langton M.D.   On: 07/10/2021 07:45   MR Abdomen W or Wo Contrast  Result Date: 07/01/2021 CLINICAL DATA:  Liver lesions seen on CT scan. EXAM: MRI ABDOMEN WITHOUT AND WITH CONTRAST TECHNIQUE: Multiplanar multisequence MR imaging of the abdomen was performed both before and after the administration of intravenous contrast. CONTRAST:  4mL GADAVIST GADOBUTROL 1 MMOL/ML IV SOLN COMPARISON:  CT scan 06/30/2021 FINDINGS: Examination is quite limited due to respiratory motion. The patient could not hold his breath for this examination. Lower chest: The lung bases grossly clear. No worrisome pulmonary lesions. No pleural or pericardial effusion. Hepatobiliary: Innumerable lesions throughout the spleen appear to be diffusion positive. No intrahepatic biliary dilatation. Normal caliber and course of the common bile duct. The gallbladder is unremarkable. Pancreas: 8 mm cyst is noted in the head body junction region the pancreas as seen on the CT scan. No worrisome MR imaging features. No ductal dilatation. Spleen:  Normal size.  Small splenic cyst noted. Adrenals/Urinary Tract: The adrenal glands are unremarkable. Simple appearing renal cysts bilaterally. The largest cyst is on the right side and measures 10 cm. Stomach/Bowel: The stomach, duodenum, visualized small visualized colon are grossly. On the CT scan could not exclude the possibility of a partially circumferential rectal mass. Recommend correlation with rectal exam. Vascular/Lymphatic: No aortic aneurysm or dissection. The major venous structures are patent. No mesenteric or retroperitoneal mass or adenopathy. Other:  No ascites or abdominal wall hernia. Musculoskeletal: 2 no  significant bony findings. IMPRESSION: 1. Examination is quite limited due to respiratory motion. 2. Innumerable lesions throughout the liver appear to be diffusion positive. Findings worrisome for metastatic disease. 3. 8 mm cyst in the head body junction region the pancreas. No worrisome MR imaging features. Recommend follow-up MRI abdomen without and with contrast in 1 year. 4. On the CT scan could not exclude the possibility of a partially circumferential rectal mass. Recommend correlation with rectal exam. 5. PET-CT may be helpful for further evaluation. Electronically Signed   By: Marijo Sanes M.D.   On: 07/01/2021 16:07   CT ABDOMEN PELVIS W CONTRAST  Result Date: 06/30/2021 CLINICAL DATA:  Abdominal pain, fever and unintended weight loss. Concern for intra-abdominal malignancy. EXAM: CT ABDOMEN AND PELVIS WITH CONTRAST TECHNIQUE: Multidetector CT imaging of the abdomen and pelvis was performed using the standard protocol following bolus administration of intravenous contrast. CONTRAST:  151mL OMNIPAQUE IOHEXOL 300 MG/ML  SOLN COMPARISON:  Abdominal ultrasound 09/25/2018. Abdominal CT 01/22/2009 FINDINGS: Lower chest: Tiny platelet subpleural opacity in the right lower lobe which was faintly visualized on 2010 exam, considered benign. No basilar pulmonary nodule, effusion or focal airspace disease. The heart is normal in size. Hepatobiliary: The liver parenchyma is diffusely heterogeneous with suggestion of innumerable low-density lesions. Largest suspected lesion in the left lobe measures 12 mm, series 2, image 15. nondistended gallbladder. No calcified gallstone or pericholecystic fat stranding. Pancreas: 9 mm cyst in the proximal pancreatic body, series 2, image 25. No ductal dilatation or inflammation. Spleen: Normal in size.  12 mm low-density in the inferior spleen. Adrenals/Urinary Tract: No adrenal nodule. No hydronephrosis. There are multiple bilateral cysts within both kidneys. This includes a  dominant exophytic cyst arising from the lower right kidney that measures 11.1  cm. There is no internal complexity. No solid renal lesions. Urinary bladder is partially distended, no obvious bladder abnormality. Stomach/Bowel: Detailed bowel assessment is limited in the absence of enteric contrast as well as paucity of intra-abdominal fat. Stomach is decompressed further limiting assessment. There is no small bowel obstruction. No obvious small bowel inflammation the appendix is not discretely visualized moderate colonic stool burden. There is colonic redundancy. The splenic flexure of the colon is nondistended which limits assessment. Vascular/Lymphatic: Aortic atherosclerosis. No aortic aneurysm. Patent portal vein. No bulky abdominopelvic adenopathy, paucity of intra-abdominal fat limits assessment for adenopathy. Reproductive: Prominent prostate gland spanning 5.4 cm with nodular projection into the bladder base. Other: No ascites.  No free air.  No definite omental thickening. Musculoskeletal: Grade 1 anterolisthesis of L5 on S1. Unilateral left L5 pars defect. Slight heterogeneous appearance of the marrow but no discrete focal bone lesion. IMPRESSION: 1. Diffusely heterogeneous liver parenchyma with suggestion of innumerable low-density lesions. Recommend further characterization with hepatic protocol MRI. This should only be performed if patient is able to tolerate breath hold technique. 2. Small cyst in the pancreas has benign CT features, 9 mm. Follow-up for a pancreatic cyst of this size in 5 years, if not further evaluated. 3. Multiple bilateral renal cysts. 4. Enlarged prostate gland with nodular projection into the bladder base. Recommend correlation with PSA. Aortic Atherosclerosis (ICD10-I70.0). Electronically Signed   By: Keith Rake M.D.   On: 06/30/2021 22:06   ECHOCARDIOGRAM COMPLETE  Result Date: 07/02/2021    ECHOCARDIOGRAM REPORT   Patient Name:   TREMAIN RUCINSKI Date of Exam: 07/02/2021  Medical Rec #:  [id number]      Height:       68.5 in Accession #:    [id number]     Weight:       154.0 lb Date of Birth:  Aug 30, 1951      BSA:          1.839 m Patient Age:    41 years       BP:           117/74 mmHg Patient Gender: M              HR:           64 bpm. Exam Location:  Inpatient Procedure: 2D Echo, Cardiac Doppler and Color Doppler Indications:    Enocardits  History:        Patient has no prior history of Echocardiogram examinations.  Sonographer:    Merrie Roof RDCS Referring Phys: [id number] [place]  1. Left ventricular ejection fraction, by estimation, is 55 to 60%. The left ventricle has normal function. The left ventricle has no regional wall motion abnormalities. Left ventricular diastolic parameters are consistent with Grade I diastolic dysfunction (impaired relaxation).  2. Right ventricular systolic function is normal. The right ventricular size is normal. There is normal pulmonary artery systolic pressure. The estimated right ventricular systolic pressure is 67.6 mmHg.  3. Left atrial size was mildly dilated.  4. The mitral valve is normal in structure. Trivial mitral valve regurgitation. No evidence of mitral stenosis.  5. The aortic valve is tricuspid. Aortic valve regurgitation is not visualized. Mild aortic valve sclerosis is present, with no evidence of aortic valve stenosis.  6. The inferior vena cava is normal in size with greater than 50% respiratory variability, suggesting right atrial pressure of 3 mmHg.  7. No definite endocarditis visualized. The aortic valve is calcified but there is not a  definite vegetation. If high suspicion, needs TEE. FINDINGS  Left Ventricle: Left ventricular ejection fraction, by estimation, is 55 to 60%. The left ventricle has normal function. The left ventricle has no regional wall motion abnormalities. The left ventricular internal cavity size was normal in size. There is  no left ventricular hypertrophy. Left ventricular diastolic  parameters are consistent with Grade I diastolic dysfunction (impaired relaxation). Right Ventricle: The right ventricular size is normal. No increase in right ventricular wall thickness. Right ventricular systolic function is normal. There is normal pulmonary artery systolic pressure. The tricuspid regurgitant velocity is 2.54 m/s, and  with an assumed right atrial pressure of 3 mmHg, the estimated right ventricular systolic pressure is 93.7 mmHg. Left Atrium: Left atrial size was mildly dilated. Right Atrium: Right atrial size was normal in size. Pericardium: There is no evidence of pericardial effusion. Mitral Valve: The mitral valve is normal in structure. Trivial mitral valve regurgitation. No evidence of mitral valve stenosis. Tricuspid Valve: The tricuspid valve is normal in structure. Tricuspid valve regurgitation is trivial. Aortic Valve: The aortic valve is tricuspid. Aortic valve regurgitation is not visualized. Mild aortic valve sclerosis is present, with no evidence of aortic valve stenosis. Aortic valve mean gradient measures 8.0 mmHg. Aortic valve peak gradient measures 16.2 mmHg. Aortic valve area, by VTI measures 1.75 cm. Pulmonic Valve: The pulmonic valve was normal in structure. Pulmonic valve regurgitation is trivial. Aorta: The aortic root is normal in size and structure. Venous: The inferior vena cava is normal in size with greater than 50% respiratory variability, suggesting right atrial pressure of 3 mmHg. IAS/Shunts: No atrial level shunt detected by color flow Doppler.  LEFT VENTRICLE PLAX 2D LVIDd:         4.70 cm   Diastology LVIDs:         3.10 cm   LV e' medial:    9.02 cm/s LV PW:         1.00 cm   LV E/e' medial:  8.7 LV IVS:        0.70 cm   LV e' lateral:   15.30 cm/s LVOT diam:     2.00 cm   LV E/e' lateral: 5.1 LV SV:         71 LV SV Index:   39 LVOT Area:     3.14 cm  RIGHT VENTRICLE RV Basal diam:  2.80 cm LEFT ATRIUM           Index        RIGHT ATRIUM           Index LA  diam:      3.30 cm 1.79 cm/m   RA Area:     13.40 cm LA Vol (A2C): 59.9 ml 32.57 ml/m  RA Volume:   25.80 ml  14.03 ml/m LA Vol (A4C): 21.0 ml 11.42 ml/m  AORTIC VALVE AV Area (Vmax):    1.71 cm AV Area (Vmean):   1.74 cm AV Area (VTI):     1.75 cm AV Vmax:           201.00 cm/s AV Vmean:          128.000 cm/s AV VTI:            0.406 m AV Peak Grad:      16.2 mmHg AV Mean Grad:      8.0 mmHg LVOT Vmax:         109.15 cm/s LVOT Vmean:  70.950 cm/s LVOT VTI:          0.226 m LVOT/AV VTI ratio: 0.56  AORTA Ao Root diam: 3.20 cm Ao Asc diam:  3.50 cm MITRAL VALVE               TRICUSPID VALVE MV Area (PHT): 3.27 cm    TR Peak grad:   25.8 mmHg MV Decel Time: 232 msec    TR Vmax:        254.00 cm/s MV E velocity: 78.70 cm/s MV A velocity: 90.80 cm/s  SHUNTS MV E/A ratio:  0.87        Systemic VTI:  0.23 m                            Systemic Diam: 2.00 cm Dalton McleanMD Electronically signed by Franki Monte Signature Date/Time: 07/02/2021/3:37:58 PM    Final    US Abdomen Limited RUQ (LIVER/GB)  Result Date: 06/30/2021 CLINICAL DATA:  Liver mass on CT. EXAM: ULTRASOUND ABDOMEN LIMITED RIGHT UPPER QUADRANT COMPARISON:  CT abdomen pelvis 06/30/2021 FINDINGS: Gallbladder: No gallstones or wall thickening visualized. No sonographic Murphy sign noted by sonographer. Common bile duct: Diameter: 4 mm. Liver: No focal hepatic lesion. Slightly heterogeneous parenchymal echogenicity. Portal vein is patent on color Doppler imaging with normal direction of blood flow towards the liver. Other: None. IMPRESSION: Slightly heterogeneous hepatic parenchyma with no sonographic finding of focal hepatic lesion. Recommend MRI liver protocol for further evaluation given CT abdomen pelvis 06/30/2021 findings. When the patient is clinically stable and able to follow directions and hold their breath (preferably as an outpatient) further evaluation with dedicated abdominal MRI should be considered. Electronically Signed    By: Iven Finn M.D.   On: 06/30/2021 23:41    ASSESSMENT & PLAN:   1.  Coagulopathy Patient has coagulopathy with abnormal PT and abdominal aPTT as per labs done in [facility]. However there is a lab discrepancy since PT and aPTT baseline on the mixing studies done at [place] were completely within normal limits and so no mixing studies were required. Component     Latest Ref Rng & Units 07/10/2021 07/10/2021        10:09 AM 10:09 AM  APTT     24 - 36 seconds 45 (H) 30.2  aPTT 1:1 Normal Plasma       NOT PERFORMED  aPTT 1:1 Mix Saline       NOT PERFORMED  aPTT 1:1 NP Mix, 60 Min,Incub.       NOT PERFORMED  PT     9.1 - 12.0 sec 11.1   PT 1:1NP     9.1 - 12.0 sec 10.2   1 HR INCUB PT 1:1NP     sec NIY   Prothrombin Time     11.4 - 15.2 seconds 28.4 (H)   INR     0.8 - 1.2 2.7 (H)    Fibrinogen level within normal limits at 409.  DIC panel no schistocytes no overt evidence of uncompensated DIC. PLAN -I discussed available lab results in details with the patient and his wife at bedside. -We discussed that his underlying liver pathology or vitamin K deficiency due to poor p.o. intake and significant protein calorie malnutrition might be concerns. -We subsequently got the mixing study and the baseline PT and APTT on those done at [place] were completely normal discordant with the same labs done at [facility]. -I spent  significant period of time discussing this with the [facility] laboratory including with the lab supervisor who shall will be looking into the reason for these discordant labs and calling [facility] -I have ordered repeat PT, APTT here at [facility] and also repeat mixing studies since the previous results are discordant and indeterminate. -Awaiting lupus anticoagulant -We will check factor V assay to differentiate between vitamin K deficiency and coagulopathy due to liver disease. -We will also check factor X and factor II -Myeloma  panel -Patient notes that he did handle D-CON about 4 weeks ago denies any other exposure to warfarin like substances. -We will empirically start the patient on vitamin K 10 mg p.o. daily to try to reverse any vitamin K dependent coagulopathy and optimize coagulopathy from liver disease.   2.  Liver lesions -06/30/2021 CT abdomen/pelvis-diffusely heterogeneous liver parenchyma with suggestion of innumerable low-density lesions, small cyst in the pancreas, multiple bilateral renal cysts, enlarged prostate gland with nodular projection into the bladder base. -06/30/2021 PSA was 0.51 -07/01/2021 MRI of the abdomen-innumerable lesions throughout the liver which appear to be diffusion positive and worrisome for metastatic disease -07/01/2021 AFP was 1.4 -07/10/2021 CT chest-no definite findings to suggest metastatic disease to the lungs, stable 4 mm subpleural nodule favored to be benign -07/03/2021-flexible sigmoidoscopy by Dr. [name] was unrevealing Component     Latest Ref Rng & Units 07/09/2021  Hepatitis B Surface Ag     NON REACTIVE NON REACTIVE  Hep B Core Total Ab     NON REACTIVE NON REACTIVE  Hepatitis B-Post     Immunity>9.9 mIU/mL 401.8  HCV Ab     0.0 - 0.9 s/co ratio 0.1   PLAN -CEA and CA 19-9 levels pending -Denies any alcohol use. -No overt evidence of primary tumor outside the liver. -CT-guided or ultrasound-guided liver biopsy once coagulopathy is addressed.  3.  Fever, resolved 4.  Mild normocytic anemia 5.  Leukocytosis 6.  Enlarged prostate 7.  Pancreatic cyst, renal cysts, splenic cyst 8.  CKD 9.  Hypertension 10.  Dyslipidemia 11.  Protein calorie malnutrition/weight loss  I spent 30 minutes counseling the patient face to face. The total time spent in the appointment was 40 minutes and more than 50% was on counseling and direct patient cares.    Sullivan Lone MD [place] AAHIVMS [facility] Hematology/Oncology Physician [facility]  (Office):        [phone] (Work cell):  [phone] (Fax):           [phone]  07/11/2021 12:35 PM

## 2021-07-12 DIAGNOSIS — K769 Liver disease, unspecified: Secondary | ICD-10-CM | POA: Diagnosis not present

## 2021-07-12 DIAGNOSIS — E43 Unspecified severe protein-calorie malnutrition: Secondary | ICD-10-CM | POA: Diagnosis not present

## 2021-07-12 DIAGNOSIS — D689 Coagulation defect, unspecified: Secondary | ICD-10-CM | POA: Diagnosis not present

## 2021-07-12 DIAGNOSIS — I700 Atherosclerosis of aorta: Secondary | ICD-10-CM

## 2021-07-12 DIAGNOSIS — K862 Cyst of pancreas: Secondary | ICD-10-CM

## 2021-07-12 DIAGNOSIS — G9341 Metabolic encephalopathy: Secondary | ICD-10-CM

## 2021-07-12 NOTE — Assessment & Plan Note (Signed)
--   Thought secondary to acute illness, resolved.

## 2021-07-12 NOTE — Assessment & Plan Note (Signed)
8 mm cyst in the head body junction region the pancreas. No worrisome MR imaging features. Recommend follow-up MRI abdomen without and with contrast in 1 year.

## 2021-07-12 NOTE — Assessment & Plan Note (Addendum)
--   Concern for malignancy, but no definitive evidence otherwise.  Plan for liver biopsy when able, delayed by coagulation abnormalities.

## 2021-07-12 NOTE — Assessment & Plan Note (Signed)
--  no treatment indicated °

## 2021-07-12 NOTE — Assessment & Plan Note (Signed)
--   Sepsis ruled out, antibiotics discontinued, no evidence of infection.  Suspect related to liver lesions.

## 2021-07-12 NOTE — Assessment & Plan Note (Signed)
--  resolved, secondary to poor intake

## 2021-07-12 NOTE — Progress Notes (Signed)
Hematology Short note  Rpt PT and PTT mixing studies pending (previous mixing studies with discordant results and showed normal baseline PT and PTT) Factor assays ordered pending. -lupus anticoag pending CA 19-9 and CEA pending If mixing studies show no evidence of factor inhibitor and not vit K responsive coag and if it appears to be related to liver disease -- then might need to consider FFP prior to liver biopsy next this coming week. Dr Benay Spice Mikey Bussing DNP will f/u tomorrow  Sullivan Lone
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
## 2021-07-12 NOTE — Assessment & Plan Note (Signed)
--   Management per dietitian

## 2021-07-12 NOTE — Assessment & Plan Note (Addendum)
--   Etiology unclear, hematology evaluation in process, may need FFP in order to obtain liver biopsy. --INR 2.5, lower today but has been fluctuating.

## 2021-07-13 ENCOUNTER — Other Ambulatory Visit: Payer: Self-pay | Admitting: *Deleted

## 2021-07-13 ENCOUNTER — Other Ambulatory Visit (HOSPITAL_COMMUNITY): Payer: Self-pay

## 2021-07-13 DIAGNOSIS — D689 Coagulation defect, unspecified: Secondary | ICD-10-CM

## 2021-07-13 DIAGNOSIS — K862 Cyst of pancreas: Secondary | ICD-10-CM

## 2021-07-13 DIAGNOSIS — K769 Liver disease, unspecified: Secondary | ICD-10-CM | POA: Diagnosis not present

## 2021-07-13 LAB — FACTOR 2 ASSAY: Factor II Activity: 98 % (ref 50–154)

## 2021-07-13 LAB — FACTOR 10 ASSAY: Factor X Activity: 109 % (ref 76–183)

## 2021-07-13 LAB — DRVVT MIX: dRVVT Mix: 61.9 s — ABNORMAL HIGH (ref 0.0–40.4)

## 2021-07-13 LAB — PROTIME-INR
INR: 2.5 — ABNORMAL HIGH (ref 0.8–1.2)
Prothrombin Time: 26.7 seconds — ABNORMAL HIGH (ref 11.4–15.2)

## 2021-07-13 LAB — DRVVT CONFIRM: dRVVT Confirm: 1.3 ratio — ABNORMAL HIGH (ref 0.8–1.2)

## 2021-07-13 LAB — ANGIOTENSIN CONVERTING ENZYME: Angiotensin-Converting Enzyme: 26 U/L (ref 14–82)

## 2021-07-13 LAB — HEXAGONAL PHASE PHOSPHOLIPID: Hexagonal Phase Phospholipid: 3 s (ref 0–11)

## 2021-07-13 LAB — LUPUS ANTICOAGULANT PANEL
DRVVT: 58.6 s — ABNORMAL HIGH (ref 0.0–47.0)
PTT Lupus Anticoagulant: 58.4 s — ABNORMAL HIGH (ref 0.0–51.9)

## 2021-07-13 LAB — PTT-LA MIX: PTT-LA Mix: 63.4 s — ABNORMAL HIGH (ref 0.0–48.9)

## 2021-07-13 LAB — FACTOR 5 ASSAY: Factor V Activity: 102 % (ref 70–150)

## 2021-07-13 MED ORDER — PHYTONADIONE 5 MG PO TABS
10.0000 mg | ORAL_TABLET | Freq: Every day | ORAL | 0 refills | Status: AC
  Filled 2021-07-13: qty 4, 2d supply, fill #0

## 2021-07-13 NOTE — Consult Note (Signed)
   [facility] Inpatient Consult   07/13/2021  Shane Lam 06-15-1951 [id number]  [facility] [AC[facility]] Patient:  Medicare  Primary Care Provider:  [name], MD, [facility]   Patient screened for [facility] 12 days hospitalization with noted high risk score for unplanned readmission risk and  to assess for potential [place] Management service needs for post hospital transition.  Came by to speak with patient he was preparing for transitioning home and staff working with patient/family.   Plan:  Out reach referral to be sent.    For questions contact:   Natividad Brood, RN BSN [facility] Liaison  [phone] business mobile phone Toll free office [phone]  Fax number: [phone] [email or web address]

## 2021-07-13 NOTE — Progress Notes (Signed)
    Has been on IR Radar since 11/7 Liver lesion biopsy request INR too high to safely perform bx Vit K per team without great response  INR still 2.5 today (3.0 yesterday)  Will cancel order for now Please re order when feel pt is appropriate Will need INR wnl- below 1.5

## 2021-07-13 NOTE — Progress Notes (Signed)
Iver Nestle to be D/C'd Home per MD order.  Discussed with the patient and all questions fully answered.  VSS, Skin clean, dry and intact without evidence of skin break down, no evidence of skin tears noted. IV catheter discontinued intact. Site without signs and symptoms of complications. Dressing and pressure applied.  An After Visit Summary was printed and given to the patient. Patient received prescription from [place].  D/c education completed with patient/family including follow up instructions, medication list, d/c activities limitations if indicated, with other d/c instructions as indicated by MD - patient able to verbalize understanding, all questions fully answered.   Patient instructed to return to ED, call 911, or call MD for any changes in condition.   Patient escorted via [place], and D/C home via private auto.  Manuella Ghazi 07/13/2021 4:15 PM

## 2021-07-13 NOTE — Progress Notes (Signed)
Nutrition Follow-up  DOCUMENTATION CODES:  Severe malnutrition in context of chronic illness  INTERVENTION:  Continue regular diet.  Continue [employer] 1.4 BID.  Continue MVI with minerals daily.  Continue to encourage PO and supplement intake.  Obtain updated weight.  NUTRITION DIAGNOSIS:  Severe Malnutrition related to chronic illness (possible liver cancer) as evidenced by severe fat depletion, moderate muscle depletion, percent weight loss. - ongoing  GOAL:  Patient will meet greater than or equal to 90% of their needs  MONITOR:  PO intake, Supplement acceptance, Weight trends, Labs, I & O's  REASON FOR ASSESSMENT:  Consult Assessment of nutrition requirement/status  ASSESSMENT:  70 y.o. male presented to the ED with fever, weakness, and fatigue. PMH includes HTN. Pt admitted with sepsis and with possible metastatic liver cancer. 11/4 - flexible sigmoidoscopy 11/6 - interventional radiology consultation for liver biopsy 11/7-11/13 - biopsy canceled secondary to coagulopathy; subsequently has been treated with vitamin K without significant improvement; seen by oncology with further studies pending, may need FFP  Once labs improve, liver biopsy to occur. Evaluated on a daily basis, per MD.  Per Epic, pt eating an average of ~66% of meals (0-100%). Pt taking [employer] supplements consistently.  Pt in need of new weight. RD to order.  Continue current nutrition plan.  Supplements: [employer] 1.4 BID  Medications: reviewed; MVI with minerals, miralax, spironolactone  Labs: reviewed  Diet Order:   Diet Order             Diet regular Room service appropriate? Yes; Fluid consistency: Thin  Diet effective now                  EDUCATION NEEDS:  No education needs have been identified at this time  Skin:  Skin Assessment: Reviewed RN Assessment  Last BM:  07/12/21  Height:  Ht Readings from Last 1 Encounters:  07/05/21 5' 8.5" (1.74 m)   Weight:  Wt  Readings from Last 1 Encounters:  07/05/21 69.9 kg   BMI:  Body mass index is 23.09 kg/m.  Estimated Nutritional Needs:  Kcal:  2100-2300 Protein:  105-120 grams Fluid:  > 2 L  Derrel Nip, RD, LDN (she/her/hers) Clinical Inpatient Dietitian RD Pager/After-Hours/Weekend Pager # in [place]

## 2021-07-13 NOTE — Progress Notes (Signed)
Mobility Specialist Criteria Algorithm Info.  Mobility Team: HOB elevated: Activity: Ambulated in hall (to chair after) Range of motion: Active; All extremities Level of assistance: Modified independent, requires aide device or extra time Assistive device: Front wheel walker Distance ambulated (ft): 5000 ft (5000+) Mobility response: Tolerated well Bed Position: Chair  Patient ambulated in hallway mod I with steady gait. Tolerated ambulation well without complaint or incident. Was left sitting in recliner with all needs met.    07/13/2021 3:41 PM

## 2021-07-13 NOTE — Discharge Summary (Signed)
Physician Discharge Summary   Patient name: Shane Lam  Admit date:     07/01/2021  Discharge date: 07/13/2021  Discharge Physician: Murray Hodgkins   PCP: [name], MD   Recommendations at discharge:  Follow-up coagulopathy of unclear etiology Follow-up innumerable liver lesions, biopsy suggested once coagulopathy corrected 8 mm cyst in the head body junction region the pancreas. No worrisome MR imaging features. Recommend follow-up MRI abdomen without and with contrast in 1 year.  Discharge Diagnoses Principal Problem:   Liver lesion Active Problems:   Coagulopathy (HCC)   Protein-calorie malnutrition, severe   Aortic atherosclerosis (HCC)   Pancreas cyst  Resolved Diagnoses Resolved Problems:   Systemic inflammatory response syndrome (HCC)   Hyponatremia   Acute metabolic encephalopathy  Hospital Course   70 year old man presented with fever and generalized weakness.  Imaging revealed innumerable liver lesions.   --11/2 admitted for suspected sepsis of unknown source, further evaluation of liver lesions --11/3 GI eval for possible rectal mass, not appreciated on physical examination -- 11/4 flexible sigmoidoscopy with normal colon, GI signed off --11/6 interventional radiology consultation for liver biopsy --11/7-11/14 biopsy canceled secondary to coagulopathy.  Subsequently has been treated with vitamin K without significant improvement.  Seen by hematology with further studies pending.  Discussed with hematology, Dr. Benay Spice, okay for discharge today, complete total 5 days of vitamin K (2 more days), he will arrange outpatient follow-up to review pending labs, coagulopathy and possible liver biopsy.  * Liver lesion -- Concern for malignancy, but no definitive evidence otherwise.  Plan for liver biopsy when able, delayed by coagulation abnormalities. -- Discussed with Dr. Benay Spice, he will follow-up coagulopathy and arrange for biopsy as indicated.  Systemic  inflammatory response syndrome (HCC)-resolved as of 07/12/2021 -- Sepsis ruled out, antibiotics discontinued, no evidence of infection.  Suspect related to liver lesions.  Coagulopathy ([place]) -- Etiology unclear, hematology evaluation in process, may need FFP in order to obtain liver biopsy -- In discussion with Dr. Benay Spice, plan for discharge home, complete 5 days vitamin K, outpatient follow-up and coordination of care per Dr. Benay Spice  Pancreas cyst 8 mm cyst in the head body junction region the pancreas. No worrisome MR imaging features. Recommend follow-up MRI abdomen without and with contrast in 1 year.  Aortic atherosclerosis ([place]) --no treatment indicated  Protein-calorie malnutrition, severe -- Management per dietitian  Acute metabolic encephalopathy-resolved as of 07/12/2021 -- Thought secondary to acute illness, resolved.  Hyponatremia-resolved as of 07/12/2021 --resolved, secondary to poor intake   Procedures performed:  Flex sig   Condition at discharge: good  Exam See progress note same day  Disposition: Home  Discharge time: greater than 30 minutes.  Follow-up [facility] up.   Why: Home health services Contact information: [address] [address] [phone]                 Allergies as of 07/13/2021   No Active Allergies      Medication List     STOP taking these medications    acetaminophen 500 MG tablet Commonly known as: TYLENOL   Co Q 10 100 MG Caps   Magnesium 400 MG Caps   rosuvastatin 5 MG tablet Commonly known as: CRESTOR   vitamin C 1000 MG tablet   Vitamin D3 25 MCG (1000 UT) Caps   Zinc 100 MG Tabs       TAKE these medications    felodipine 10 MG 24  hr tablet Commonly known as: PLENDIL Take 10 mg by mouth daily.   multivitamin tablet Take 1 tablet by mouth daily.   phytonadione 5 MG tablet Commonly known as: VITAMIN K Take 2 tablets (10 mg  total) by mouth daily for 2 days. Start 11/15 AM Start taking on: July 14, 2021   potassium chloride SA 20 MEQ tablet Commonly known as: KLOR-CON Take 20 mEq by mouth daily.   PROTEASE-BETAINE HCL PO Take 2 tablets by mouth daily.   spironolactone 25 MG tablet Commonly known as: ALDACTONE Take 25 mg by mouth daily.   tadalafil 20 MG tablet Commonly known as: CIALIS Take 20 mg by mouth daily as needed for erectile dysfunction.   telmisartan 80 MG tablet Commonly known as: MICARDIS Take 80 mg by mouth daily.               Durable Medical Equipment  (From admission, onward)           Start     Ordered   07/07/21 0724  For home use only DME Walker rolling  Once       Question Answer Comment  Walker: Other   Comments 2 wheels   Patient needs a walker to treat with the following condition Impaired ambulation      07/07/21 0724   07/07/21 0724  For home use only DME Bedside commode  Once       Question:  Patient needs a bedside commode to treat with the following condition  Answer:  Ambulatory dysfunction   07/07/21 0724   07/06/21 1513  For home use only DME 3 n 1  Once        07/06/21 1513            DG Chest 2 View  Result Date: 06/30/2021 CLINICAL DATA:  Fever. EXAM: CHEST - 2 VIEW COMPARISON:  Radiograph 10/10/2006 FINDINGS: The cardiomediastinal contours are normal. Minor right middle lobe atelectasis. Pulmonary vasculature is normal. No consolidation, pleural effusion, or pneumothorax. Thoracic spondylosis with endplate spurring. No acute osseous abnormalities are seen. IMPRESSION: Minor right middle lobe atelectasis.  No evidence of pneumonia Electronically Signed   By: Keith Rake M.D.   On: 06/30/2021 21:08   CT Head Wo Contrast  Result Date: 06/30/2021 CLINICAL DATA:  Fever started on Friday, Seen at [facility] and cleared without anything abnormal. Covid test x 3 which have been negative, yesterday nausea, today lack of appetite and more sleepy.  EXAM: CT HEAD WITHOUT CONTRAST TECHNIQUE: Contiguous axial images were obtained from the base of the skull through the vertex without intravenous contrast. COMPARISON:  None. FINDINGS: Brain: No evidence of acute infarction, hemorrhage, hydrocephalus, extra-axial collection or mass lesion/mass effect. Vascular: No hyperdense vessel or unexpected calcification. Skull: Normal. Negative for fracture or focal lesion. Sinuses/Orbits: Globes and orbits are unremarkable. Visualized sinuses are clear. Other: None. IMPRESSION: Normal unenhanced CT scan of the brain. Electronically Signed   By: Lajean Manes M.D.   On: 06/30/2021 21:55   CT CHEST W CONTRAST  Result Date: 07/10/2021 CLINICAL DATA:  70 year old male with history of cancer of unknown primary origin. EXAM: CT CHEST WITH CONTRAST TECHNIQUE: Multidetector CT imaging of the chest was performed during intravenous contrast administration. CONTRAST:  153mL OMNIPAQUE IOHEXOL 300 MG/ML  SOLN COMPARISON:  Cardiac CT 11/12/2020.  No prior chest CT. FINDINGS: Cardiovascular: Heart size is normal. There is no significant pericardial fluid, thickening or pericardial calcification. There is aortic atherosclerosis, as well as atherosclerosis of the  great vessels of the mediastinum and the coronary arteries, including calcified atherosclerotic plaque in the left main, left anterior descending, left circumflex and right coronary arteries. Mediastinum/Nodes: No pathologically enlarged mediastinal or hilar lymph nodes. Esophagus is unremarkable in appearance. No axillary lymphadenopathy. Lungs/Pleura: 4 mm right middle lobe nodule associated with the minor fissure (axial image 87 of series 4), stable compared to the prior study, likely a benign subpleural lymph node. No other definite suspicious appearing pulmonary nodules or masses are noted. No acute consolidative airspace disease. No pleural effusions. Upper Abdomen: 2.5 cm low-attenuation lesion in the upper pole of the  right kidney is compatible with a simple cyst. Multiple hepatic lesions noted on recent abdominal MRI are not confidently identified on today's CT examination (please see dictation for abdominal MRI 07/01/2021 for full description of these findings). Musculoskeletal: There are no aggressive appearing lytic or blastic lesions noted in the visualized portions of the skeleton. IMPRESSION: 1. No definite findings to suggest metastatic disease to the lungs. 2. Previously noted 4 mm subpleural nodule in the right middle lobe is stable compared to the prior study, strongly favored to represent a benign subpleural lymph node. 3. Aortic atherosclerosis, in addition to left main and 3 vessel coronary artery disease. Please note that although the presence of coronary artery calcium documents the presence of coronary artery disease, the severity of this disease and any potential stenosis cannot be assessed on this non-gated CT examination. Assessment for potential risk factor modification, dietary therapy or pharmacologic therapy may be warranted, if clinically indicated. Aortic Atherosclerosis (ICD10-I70.0). Electronically Signed   By: Vinnie Langton M.D.   On: 07/10/2021 07:45   MR Abdomen W or Wo Contrast  Result Date: 07/01/2021 CLINICAL DATA:  Liver lesions seen on CT scan. EXAM: MRI ABDOMEN WITHOUT AND WITH CONTRAST TECHNIQUE: Multiplanar multisequence MR imaging of the abdomen was performed both before and after the administration of intravenous contrast. CONTRAST:  72mL GADAVIST GADOBUTROL 1 MMOL/ML IV SOLN COMPARISON:  CT scan 06/30/2021 FINDINGS: Examination is quite limited due to respiratory motion. The patient could not hold his breath for this examination. Lower chest: The lung bases grossly clear. No worrisome pulmonary lesions. No pleural or pericardial effusion. Hepatobiliary: Innumerable lesions throughout the spleen appear to be diffusion positive. No intrahepatic biliary dilatation. Normal caliber and  course of the common bile duct. The gallbladder is unremarkable. Pancreas: 8 mm cyst is noted in the head body junction region the pancreas as seen on the CT scan. No worrisome MR imaging features. No ductal dilatation. Spleen:  Normal size.  Small splenic cyst noted. Adrenals/Urinary Tract: The adrenal glands are unremarkable. Simple appearing renal cysts bilaterally. The largest cyst is on the right side and measures 10 cm. Stomach/Bowel: The stomach, duodenum, visualized small visualized colon are grossly. On the CT scan could not exclude the possibility of a partially circumferential rectal mass. Recommend correlation with rectal exam. Vascular/Lymphatic: No aortic aneurysm or dissection. The major venous structures are patent. No mesenteric or retroperitoneal mass or adenopathy. Other:  No ascites or abdominal wall hernia. Musculoskeletal: 2 no significant bony findings. IMPRESSION: 1. Examination is quite limited due to respiratory motion. 2. Innumerable lesions throughout the liver appear to be diffusion positive. Findings worrisome for metastatic disease. 3. 8 mm cyst in the head body junction region the pancreas. No worrisome MR imaging features. Recommend follow-up MRI abdomen without and with contrast in 1 year. 4. On the CT scan could not exclude the possibility of a partially circumferential rectal  mass. Recommend correlation with rectal exam. 5. PET-CT may be helpful for further evaluation. Electronically Signed   By: Marijo Sanes M.D.   On: 07/01/2021 16:07   CT ABDOMEN PELVIS W CONTRAST  Result Date: 06/30/2021 CLINICAL DATA:  Abdominal pain, fever and unintended weight loss. Concern for intra-abdominal malignancy. EXAM: CT ABDOMEN AND PELVIS WITH CONTRAST TECHNIQUE: Multidetector CT imaging of the abdomen and pelvis was performed using the standard protocol following bolus administration of intravenous contrast. CONTRAST:  142mL OMNIPAQUE IOHEXOL 300 MG/ML  SOLN COMPARISON:  Abdominal  ultrasound 09/25/2018. Abdominal CT 01/22/2009 FINDINGS: Lower chest: Tiny platelet subpleural opacity in the right lower lobe which was faintly visualized on 2010 exam, considered benign. No basilar pulmonary nodule, effusion or focal airspace disease. The heart is normal in size. Hepatobiliary: The liver parenchyma is diffusely heterogeneous with suggestion of innumerable low-density lesions. Largest suspected lesion in the left lobe measures 12 mm, series 2, image 15. nondistended gallbladder. No calcified gallstone or pericholecystic fat stranding. Pancreas: 9 mm cyst in the proximal pancreatic body, series 2, image 25. No ductal dilatation or inflammation. Spleen: Normal in size.  12 mm low-density in the inferior spleen. Adrenals/Urinary Tract: No adrenal nodule. No hydronephrosis. There are multiple bilateral cysts within both kidneys. This includes a dominant exophytic cyst arising from the lower right kidney that measures 11.1 cm. There is no internal complexity. No solid renal lesions. Urinary bladder is partially distended, no obvious bladder abnormality. Stomach/Bowel: Detailed bowel assessment is limited in the absence of enteric contrast as well as paucity of intra-abdominal fat. Stomach is decompressed further limiting assessment. There is no small bowel obstruction. No obvious small bowel inflammation the appendix is not discretely visualized moderate colonic stool burden. There is colonic redundancy. The splenic flexure of the colon is nondistended which limits assessment. Vascular/Lymphatic: Aortic atherosclerosis. No aortic aneurysm. Patent portal vein. No bulky abdominopelvic adenopathy, paucity of intra-abdominal fat limits assessment for adenopathy. Reproductive: Prominent prostate gland spanning 5.4 cm with nodular projection into the bladder base. Other: No ascites.  No free air.  No definite omental thickening. Musculoskeletal: Grade 1 anterolisthesis of L5 on S1. Unilateral left L5 pars  defect. Slight heterogeneous appearance of the marrow but no discrete focal bone lesion. IMPRESSION: 1. Diffusely heterogeneous liver parenchyma with suggestion of innumerable low-density lesions. Recommend further characterization with hepatic protocol MRI. This should only be performed if patient is able to tolerate breath hold technique. 2. Small cyst in the pancreas has benign CT features, 9 mm. Follow-up for a pancreatic cyst of this size in 5 years, if not further evaluated. 3. Multiple bilateral renal cysts. 4. Enlarged prostate gland with nodular projection into the bladder base. Recommend correlation with PSA. Aortic Atherosclerosis (ICD10-I70.0). Electronically Signed   By: Keith Rake M.D.   On: 06/30/2021 22:06   ECHOCARDIOGRAM COMPLETE  Result Date: 07/02/2021    ECHOCARDIOGRAM REPORT   Patient Name:   KAEDIN HICKLIN Date of Exam: 07/02/2021 Medical Rec #:  [id number]      Height:       68.5 in Accession #:    [id number]     Weight:       154.0 lb Date of Birth:  1950-12-19      BSA:          1.839 m Patient Age:    74 years       BP:           117/74 mmHg Patient Gender: M  HR:           64 bpm. Exam Location:  Inpatient Procedure: 2D Echo, Cardiac Doppler and Color Doppler Indications:    Enocardits  History:        Patient has no prior history of Echocardiogram examinations.  Sonographer:    Merrie Roof RDCS Referring Phys: [id number] [place]  1. Left ventricular ejection fraction, by estimation, is 55 to 60%. The left ventricle has normal function. The left ventricle has no regional wall motion abnormalities. Left ventricular diastolic parameters are consistent with Grade I diastolic dysfunction (impaired relaxation).  2. Right ventricular systolic function is normal. The right ventricular size is normal. There is normal pulmonary artery systolic pressure. The estimated right ventricular systolic pressure is 73.7 mmHg.  3. Left atrial size was mildly dilated.  4. The  mitral valve is normal in structure. Trivial mitral valve regurgitation. No evidence of mitral stenosis.  5. The aortic valve is tricuspid. Aortic valve regurgitation is not visualized. Mild aortic valve sclerosis is present, with no evidence of aortic valve stenosis.  6. The inferior vena cava is normal in size with greater than 50% respiratory variability, suggesting right atrial pressure of 3 mmHg.  7. No definite endocarditis visualized. The aortic valve is calcified but there is not a definite vegetation. If high suspicion, needs TEE. FINDINGS  Left Ventricle: Left ventricular ejection fraction, by estimation, is 55 to 60%. The left ventricle has normal function. The left ventricle has no regional wall motion abnormalities. The left ventricular internal cavity size was normal in size. There is  no left ventricular hypertrophy. Left ventricular diastolic parameters are consistent with Grade I diastolic dysfunction (impaired relaxation). Right Ventricle: The right ventricular size is normal. No increase in right ventricular wall thickness. Right ventricular systolic function is normal. There is normal pulmonary artery systolic pressure. The tricuspid regurgitant velocity is 2.54 m/s, and  with an assumed right atrial pressure of 3 mmHg, the estimated right ventricular systolic pressure is 10.6 mmHg. Left Atrium: Left atrial size was mildly dilated. Right Atrium: Right atrial size was normal in size. Pericardium: There is no evidence of pericardial effusion. Mitral Valve: The mitral valve is normal in structure. Trivial mitral valve regurgitation. No evidence of mitral valve stenosis. Tricuspid Valve: The tricuspid valve is normal in structure. Tricuspid valve regurgitation is trivial. Aortic Valve: The aortic valve is tricuspid. Aortic valve regurgitation is not visualized. Mild aortic valve sclerosis is present, with no evidence of aortic valve stenosis. Aortic valve mean gradient measures 8.0 mmHg. Aortic valve  peak gradient measures 16.2 mmHg. Aortic valve area, by VTI measures 1.75 cm. Pulmonic Valve: The pulmonic valve was normal in structure. Pulmonic valve regurgitation is trivial. Aorta: The aortic root is normal in size and structure. Venous: The inferior vena cava is normal in size with greater than 50% respiratory variability, suggesting right atrial pressure of 3 mmHg. IAS/Shunts: No atrial level shunt detected by color flow Doppler.  LEFT VENTRICLE PLAX 2D LVIDd:         4.70 cm   Diastology LVIDs:         3.10 cm   LV e' medial:    9.02 cm/s LV PW:         1.00 cm   LV E/e' medial:  8.7 LV IVS:        0.70 cm   LV e' lateral:   15.30 cm/s LVOT diam:     2.00 cm   LV E/e' lateral:  5.1 LV SV:         71 LV SV Index:   39 LVOT Area:     3.14 cm  RIGHT VENTRICLE RV Basal diam:  2.80 cm LEFT ATRIUM           Index        RIGHT ATRIUM           Index LA diam:      3.30 cm 1.79 cm/m   RA Area:     13.40 cm LA Vol (A2C): 59.9 ml 32.57 ml/m  RA Volume:   25.80 ml  14.03 ml/m LA Vol (A4C): 21.0 ml 11.42 ml/m  AORTIC VALVE AV Area (Vmax):    1.71 cm AV Area (Vmean):   1.74 cm AV Area (VTI):     1.75 cm AV Vmax:           201.00 cm/s AV Vmean:          128.000 cm/s AV VTI:            0.406 m AV Peak Grad:      16.2 mmHg AV Mean Grad:      8.0 mmHg LVOT Vmax:         109.15 cm/s LVOT Vmean:        70.950 cm/s LVOT VTI:          0.226 m LVOT/AV VTI ratio: 0.56  AORTA Ao Root diam: 3.20 cm Ao Asc diam:  3.50 cm MITRAL VALVE               TRICUSPID VALVE MV Area (PHT): 3.27 cm    TR Peak grad:   25.8 mmHg MV Decel Time: 232 msec    TR Vmax:        254.00 cm/s MV E velocity: 78.70 cm/s MV A velocity: 90.80 cm/s  SHUNTS MV E/A ratio:  0.87        Systemic VTI:  0.23 m                            Systemic Diam: 2.00 cm Dalton McleanMD Electronically signed by Franki Monte Signature Date/Time: 07/02/2021/3:37:58 PM    Final    US Abdomen Limited RUQ (LIVER/GB)  Result Date: 06/30/2021 CLINICAL DATA:  Liver mass on  CT. EXAM: ULTRASOUND ABDOMEN LIMITED RIGHT UPPER QUADRANT COMPARISON:  CT abdomen pelvis 06/30/2021 FINDINGS: Gallbladder: No gallstones or wall thickening visualized. No sonographic Murphy sign noted by sonographer. Common bile duct: Diameter: 4 mm. Liver: No focal hepatic lesion. Slightly heterogeneous parenchymal echogenicity. Portal vein is patent on color Doppler imaging with normal direction of blood flow towards the liver. Other: None. IMPRESSION: Slightly heterogeneous hepatic parenchyma with no sonographic finding of focal hepatic lesion. Recommend MRI liver protocol for further evaluation given CT abdomen pelvis 06/30/2021 findings. When the patient is clinically stable and able to follow directions and hold their breath (preferably as an outpatient) further evaluation with dedicated abdominal MRI should be considered. Electronically Signed   By: Iven Finn M.D.   On: 06/30/2021 23:41   Results for orders placed or performed during the hospital encounter of 07/01/21  MRSA Next Gen by PCR, Nasal     Status: None   Collection Time: 07/02/21  3:24 PM   Specimen: Nasal Mucosa; Nasal Swab  Result Value Ref Range Status   MRSA by PCR Next Gen NOT DETECTED NOT DETECTED Final    Comment: (NOTE) The GeneXpert MRSA  Assay (FDA approved for NASAL specimens only), is one component of a comprehensive MRSA colonization surveillance program. It is not intended to diagnose MRSA infection nor to guide or monitor treatment for MRSA infections. Test performance is not FDA approved in patients less than 42 years old. Performed at [facility], [place] [address], [place], [place] [address]     Signed:  Murray Hodgkins MD.  [facility] 07/13/2021, 3:33 PM

## 2021-07-13 NOTE — Discharge Planning (Signed)
Oncology Discharge Planning Note  [facility] Address: [address] [place], [place], [place] [address] Hours of Operation:  Nena Polio, Monday - Friday  Clinic Contact Information:  [phone]  Oncology Care Team: Medical Oncologist:  Dr. Betsy Coder  Patient Details: Name:  Shane Lam, Shane Lam MRN:   [id number] DOB:   03-22-1951 Reason for Current Admission: Liver lesion  Discharge Planning Narrative: Notification of admission received by Dr. Benay Spice for [facility].  Discharge follow-up appointments for oncology are current and available on the AVS and MyChart. Also provided Mrs. Markie with appointment via telephone conversation.  Upon discharge from the hospital, hematology/oncology's post discharge plan of care for the outpatient setting is: [facility] at [facility] on 07/17/21 at 1 pm for lab and office visit.   Navdeep Halt will be called within two business days after discharge to review hematology/oncology's plan of care for full understanding.    Outpatient Oncology Specific Care Only: Oncology appointment transportation needs addressed?:  yes Oncology medication management for symptom management addressed?:  not applicable Chemo Alert Card reviewed?:  not applicable Immunotherapy Alert Card reviewed?:  not applicable

## 2021-07-13 NOTE — Progress Notes (Signed)
  Progress Note Shane Lam   IRC:[id number]  DOB: Jan 11, 1951  DOA: 07/01/2021     12 Date of Service: 07/13/2021   Clinical Course 70 year old man presented with fever and generalized weakness.  Imaging revealed innumerable liver lesions.   --11/2 admitted for suspected sepsis of unknown source, further evaluation of liver lesions --11/3 GI eval for possible rectal mass, not appreciated on physical examination -- 11/4 flexible sigmoidoscopy with normal colon, GI signed --11/6 interventional radiology consultation for liver biopsy --11/7-11/14 biopsy canceled secondary to coagulopathy.  Subsequently has been treated with vitamin K without significant improvement.  Seen by oncology with further studies pending.  Will discuss with oncology for potential discharge and outpatient follow-up.  Assessment and Plan * Systemic inflammatory response syndrome (HCC)-resolved as of 07/12/2021 -- Sepsis ruled out, antibiotics discontinued, no evidence of infection.  Suspect related to liver lesions.  Liver lesion -- Concern for malignancy, but no definitive evidence otherwise.  Plan for liver biopsy when able, delayed by coagulation abnormalities.  Coagulopathy ([place]) -- Etiology unclear, hematology evaluation in process, may need FFP in order to obtain liver biopsy. --INR 2.5, lower today but has been fluctuating.  Pancreas cyst 8 mm cyst in the head body junction region the pancreas. No worrisome MR imaging features. Recommend follow-up MRI abdomen without and with contrast in 1 year.  Aortic atherosclerosis ([place]) --no treatment indicated  Protein-calorie malnutrition, severe -- Management per dietitian  Acute metabolic encephalopathy-resolved as of 07/12/2021 -- Thought secondary to acute illness, resolved.  Hyponatremia-resolved as of 07/12/2021 --resolved, secondary to poor intake   Subjective:  Feels ok  Objective Vitals:   07/12/21 1731 07/12/21 2041 07/13/21 0623 07/13/21 0735   BP: 113/78 130/81 132/90 (!) 129/99  Pulse: 73 62 73 64  Resp:  17 18 16   Temp: 98.5 F (36.9 C) 98.6 F (37 C) 98.1 F (36.7 C) 97.7 F (36.5 C)  TempSrc: Oral Oral Oral Oral  SpO2: 100% 100% 100% 100%  Weight:      Height:       69.9 kg  Vital signs were reviewed and unremarkable.  Exam Physical Exam Vitals reviewed.  Constitutional:      General: He is not in acute distress. Cardiovascular:     Rate and Rhythm: Normal rate and regular rhythm.     Heart sounds: No murmur heard. Pulmonary:     Effort: Pulmonary effort is normal. No respiratory distress.     Breath sounds: No wheezing, rhonchi or rales.  Psychiatric:        Mood and Affect: Mood normal.        Behavior: Behavior normal.    Labs / Other Information My review of labs, imaging, notes and other tests is significant for INR 2.5    Disposition Plan: Status is: Inpatient  Remains inpatient appropriate because: await input from hematology  No pharmacologic anticoagulation given coagulopathy Discussed in detail with wife at bedside  Time spent: 35 minutes [facility] 07/13/2021, 2:48 PM

## 2021-07-13 NOTE — Progress Notes (Signed)
Lab orders for PT/INR and CBC/diff placed for 11/18 per Dr. Benay Spice.

## 2021-07-13 NOTE — Progress Notes (Addendum)
HEMATOLOGY-ONCOLOGY PROGRESS NOTE  SUBJECTIVE: The patient was sitting up and eating lunch at the time of my visit.  No recurrent fevers.  No bleeding.  He has no other complaints today.  The patient's wife clarified the timeline for handling some D-CON mouse and rat killer and states that they purchased this product on 06/23/2021 and he handled it on 06/24/2021.  He tells me that he handled it for maybe a few minutes.  His wife also told me that he used a liver cleanse in August.  He was taking nitrous oxide tablets until the onset of his fevers.  He was also taking lipidene and co-Q10.  He has now stopped taking these supplements.  PHYSICAL EXAMINATION: Vitals:   07/13/21 0623 07/13/21 0735  BP: 132/90 (!) 129/99  Pulse: 73 64  Resp: 18 16  Temp: 98.1 F (36.7 C) 97.7 F (36.5 C)  SpO2: 100% 100%   Filed Weights   07/05/21 1911  Weight: 69.9 kg    Intake/Output from previous day: 11/13 0701 - 11/14 0700 In: 360 [P.O.:360] Out: -   GENERAL:alert, no distress and comfortable SKIN: skin color, texture, turgor are normal, no rashes or significant lesions EYES: normal, Conjunctiva are pink and non-injected, sclera clear LUNGS: clear to auscultation and percussion with normal breathing effort HEART: regular rate & rhythm and no murmurs and no lower extremity edema ABDOMEN:abdomen soft, non-tender and normal bowel sounds Musculoskeletal:no cyanosis of digits and no clubbing  NEURO: alert & oriented x 3 with fluent speech, no focal motor/sensory deficits  LABORATORY DATA:  I have reviewed the data as listed CMP Latest Ref Rng & Units 07/11/2021 07/09/2021 07/08/2021  Glucose 70 - 99 mg/dL 98 98 94  BUN 8 - 23 mg/dL 22 23 16   Creatinine 0.61 - 1.24 mg/dL 1.22 1.32(H) 1.08  Sodium 135 - 145 mmol/L 135 134(L) 137  Potassium 3.5 - 5.1 mmol/L 4.1 4.0 3.6  Chloride 98 - 111 mmol/L 104 104 106  CO2 22 - 32 mmol/L 26 26 25   Calcium 8.9 - 10.3 mg/dL 8.7(L) 8.8(L) 8.5(L)  Total Protein  6.5 - 8.1 g/dL - - -  Total Bilirubin 0.3 - 1.2 mg/dL - - -  Alkaline Phos 38 - 126 U/L - - -  AST 15 - 41 U/L - - -  ALT 0 - 44 U/L - - -    Lab Results  Component Value Date   WBC 13.0 (H) 07/11/2021   HGB 10.8 (L) 07/11/2021   HCT 33.6 (L) 07/11/2021   MCV 90.6 07/11/2021   PLT 424 (H) 07/11/2021   NEUTROABS 8.7 (H) 07/11/2021    DG Chest 2 View  Result Date: 06/30/2021 CLINICAL DATA:  Fever. EXAM: CHEST - 2 VIEW COMPARISON:  Radiograph 10/10/2006 FINDINGS: The cardiomediastinal contours are normal. Minor right middle lobe atelectasis. Pulmonary vasculature is normal. No consolidation, pleural effusion, or pneumothorax. Thoracic spondylosis with endplate spurring. No acute osseous abnormalities are seen. IMPRESSION: Minor right middle lobe atelectasis.  No evidence of pneumonia Electronically Signed   By: Keith Rake M.D.   On: 06/30/2021 21:08   CT Head Wo Contrast  Result Date: 06/30/2021 CLINICAL DATA:  Fever started on Friday, Seen at [facility] and cleared without anything abnormal. Covid test x 3 which have been negative, yesterday nausea, today lack of appetite and more sleepy. EXAM: CT HEAD WITHOUT CONTRAST TECHNIQUE: Contiguous axial images were obtained from the base of the skull through the vertex without intravenous contrast. COMPARISON:  None. FINDINGS: Brain:  No evidence of acute infarction, hemorrhage, hydrocephalus, extra-axial collection or mass lesion/mass effect. Vascular: No hyperdense vessel or unexpected calcification. Skull: Normal. Negative for fracture or focal lesion. Sinuses/Orbits: Globes and orbits are unremarkable. Visualized sinuses are clear. Other: None. IMPRESSION: Normal unenhanced CT scan of the brain. Electronically Signed   By: Lajean Manes M.D.   On: 06/30/2021 21:55   CT CHEST W CONTRAST  Result Date: 07/10/2021 CLINICAL DATA:  70 year old male with history of cancer of unknown primary origin. EXAM: CT CHEST WITH CONTRAST TECHNIQUE: Multidetector  CT imaging of the chest was performed during intravenous contrast administration. CONTRAST:  155mL OMNIPAQUE IOHEXOL 300 MG/ML  SOLN COMPARISON:  Cardiac CT 11/12/2020.  No prior chest CT. FINDINGS: Cardiovascular: Heart size is normal. There is no significant pericardial fluid, thickening or pericardial calcification. There is aortic atherosclerosis, as well as atherosclerosis of the great vessels of the mediastinum and the coronary arteries, including calcified atherosclerotic plaque in the left main, left anterior descending, left circumflex and right coronary arteries. Mediastinum/Nodes: No pathologically enlarged mediastinal or hilar lymph nodes. Esophagus is unremarkable in appearance. No axillary lymphadenopathy. Lungs/Pleura: 4 mm right middle lobe nodule associated with the minor fissure (axial image 87 of series 4), stable compared to the prior study, likely a benign subpleural lymph node. No other definite suspicious appearing pulmonary nodules or masses are noted. No acute consolidative airspace disease. No pleural effusions. Upper Abdomen: 2.5 cm low-attenuation lesion in the upper pole of the right kidney is compatible with a simple cyst. Multiple hepatic lesions noted on recent abdominal MRI are not confidently identified on today's CT examination (please see dictation for abdominal MRI 07/01/2021 for full description of these findings). Musculoskeletal: There are no aggressive appearing lytic or blastic lesions noted in the visualized portions of the skeleton. IMPRESSION: 1. No definite findings to suggest metastatic disease to the lungs. 2. Previously noted 4 mm subpleural nodule in the right middle lobe is stable compared to the prior study, strongly favored to represent a benign subpleural lymph node. 3. Aortic atherosclerosis, in addition to left main and 3 vessel coronary artery disease. Please note that although the presence of coronary artery calcium documents the presence of coronary artery  disease, the severity of this disease and any potential stenosis cannot be assessed on this non-gated CT examination. Assessment for potential risk factor modification, dietary therapy or pharmacologic therapy may be warranted, if clinically indicated. Aortic Atherosclerosis (ICD10-I70.0). Electronically Signed   By: Vinnie Langton M.D.   On: 07/10/2021 07:45   MR Abdomen W or Wo Contrast  Result Date: 07/01/2021 CLINICAL DATA:  Liver lesions seen on CT scan. EXAM: MRI ABDOMEN WITHOUT AND WITH CONTRAST TECHNIQUE: Multiplanar multisequence MR imaging of the abdomen was performed both before and after the administration of intravenous contrast. CONTRAST:  31mL GADAVIST GADOBUTROL 1 MMOL/ML IV SOLN COMPARISON:  CT scan 06/30/2021 FINDINGS: Examination is quite limited due to respiratory motion. The patient could not hold his breath for this examination. Lower chest: The lung bases grossly clear. No worrisome pulmonary lesions. No pleural or pericardial effusion. Hepatobiliary: Innumerable lesions throughout the spleen appear to be diffusion positive. No intrahepatic biliary dilatation. Normal caliber and course of the common bile duct. The gallbladder is unremarkable. Pancreas: 8 mm cyst is noted in the head body junction region the pancreas as seen on the CT scan. No worrisome MR imaging features. No ductal dilatation. Spleen:  Normal size.  Small splenic cyst noted. Adrenals/Urinary Tract: The adrenal glands are unremarkable. Simple  appearing renal cysts bilaterally. The largest cyst is on the right side and measures 10 cm. Stomach/Bowel: The stomach, duodenum, visualized small visualized colon are grossly. On the CT scan could not exclude the possibility of a partially circumferential rectal mass. Recommend correlation with rectal exam. Vascular/Lymphatic: No aortic aneurysm or dissection. The major venous structures are patent. No mesenteric or retroperitoneal mass or adenopathy. Other:  No ascites or  abdominal wall hernia. Musculoskeletal: 2 no significant bony findings. IMPRESSION: 1. Examination is quite limited due to respiratory motion. 2. Innumerable lesions throughout the liver appear to be diffusion positive. Findings worrisome for metastatic disease. 3. 8 mm cyst in the head body junction region the pancreas. No worrisome MR imaging features. Recommend follow-up MRI abdomen without and with contrast in 1 year. 4. On the CT scan could not exclude the possibility of a partially circumferential rectal mass. Recommend correlation with rectal exam. 5. PET-CT may be helpful for further evaluation. Electronically Signed   By: Marijo Sanes M.D.   On: 07/01/2021 16:07   CT ABDOMEN PELVIS W CONTRAST  Result Date: 06/30/2021 CLINICAL DATA:  Abdominal pain, fever and unintended weight loss. Concern for intra-abdominal malignancy. EXAM: CT ABDOMEN AND PELVIS WITH CONTRAST TECHNIQUE: Multidetector CT imaging of the abdomen and pelvis was performed using the standard protocol following bolus administration of intravenous contrast. CONTRAST:  147mL OMNIPAQUE IOHEXOL 300 MG/ML  SOLN COMPARISON:  Abdominal ultrasound 09/25/2018. Abdominal CT 01/22/2009 FINDINGS: Lower chest: Tiny platelet subpleural opacity in the right lower lobe which was faintly visualized on 2010 exam, considered benign. No basilar pulmonary nodule, effusion or focal airspace disease. The heart is normal in size. Hepatobiliary: The liver parenchyma is diffusely heterogeneous with suggestion of innumerable low-density lesions. Largest suspected lesion in the left lobe measures 12 mm, series 2, image 15. nondistended gallbladder. No calcified gallstone or pericholecystic fat stranding. Pancreas: 9 mm cyst in the proximal pancreatic body, series 2, image 25. No ductal dilatation or inflammation. Spleen: Normal in size.  12 mm low-density in the inferior spleen. Adrenals/Urinary Tract: No adrenal nodule. No hydronephrosis. There are multiple  bilateral cysts within both kidneys. This includes a dominant exophytic cyst arising from the lower right kidney that measures 11.1 cm. There is no internal complexity. No solid renal lesions. Urinary bladder is partially distended, no obvious bladder abnormality. Stomach/Bowel: Detailed bowel assessment is limited in the absence of enteric contrast as well as paucity of intra-abdominal fat. Stomach is decompressed further limiting assessment. There is no small bowel obstruction. No obvious small bowel inflammation the appendix is not discretely visualized moderate colonic stool burden. There is colonic redundancy. The splenic flexure of the colon is nondistended which limits assessment. Vascular/Lymphatic: Aortic atherosclerosis. No aortic aneurysm. Patent portal vein. No bulky abdominopelvic adenopathy, paucity of intra-abdominal fat limits assessment for adenopathy. Reproductive: Prominent prostate gland spanning 5.4 cm with nodular projection into the bladder base. Other: No ascites.  No free air.  No definite omental thickening. Musculoskeletal: Grade 1 anterolisthesis of L5 on S1. Unilateral left L5 pars defect. Slight heterogeneous appearance of the marrow but no discrete focal bone lesion. IMPRESSION: 1. Diffusely heterogeneous liver parenchyma with suggestion of innumerable low-density lesions. Recommend further characterization with hepatic protocol MRI. This should only be performed if patient is able to tolerate breath hold technique. 2. Small cyst in the pancreas has benign CT features, 9 mm. Follow-up for a pancreatic cyst of this size in 5 years, if not further evaluated. 3. Multiple bilateral renal cysts. 4. Enlarged prostate  gland with nodular projection into the bladder base. Recommend correlation with PSA. Aortic Atherosclerosis (ICD10-I70.0). Electronically Signed   By: Keith Rake M.D.   On: 06/30/2021 22:06   ECHOCARDIOGRAM COMPLETE  Result Date: 07/02/2021    ECHOCARDIOGRAM REPORT    Patient Name:   Shane Lam Date of Exam: 07/02/2021 Medical Rec #:  [id number]      Height:       68.5 in Accession #:    [id number]     Weight:       154.0 lb Date of Birth:  05/04/1951      BSA:          1.839 m Patient Age:    70 years       BP:           117/74 mmHg Patient Gender: M              HR:           64 bpm. Exam Location:  Inpatient Procedure: 2D Echo, Cardiac Doppler and Color Doppler Indications:    Enocardits  History:        Patient has no prior history of Echocardiogram examinations.  Sonographer:    Merrie Roof RDCS Referring Phys: [id number] [place]  1. Left ventricular ejection fraction, by estimation, is 55 to 60%. The left ventricle has normal function. The left ventricle has no regional wall motion abnormalities. Left ventricular diastolic parameters are consistent with Grade I diastolic dysfunction (impaired relaxation).  2. Right ventricular systolic function is normal. The right ventricular size is normal. There is normal pulmonary artery systolic pressure. The estimated right ventricular systolic pressure is 22.9 mmHg.  3. Left atrial size was mildly dilated.  4. The mitral valve is normal in structure. Trivial mitral valve regurgitation. No evidence of mitral stenosis.  5. The aortic valve is tricuspid. Aortic valve regurgitation is not visualized. Mild aortic valve sclerosis is present, with no evidence of aortic valve stenosis.  6. The inferior vena cava is normal in size with greater than 50% respiratory variability, suggesting right atrial pressure of 3 mmHg.  7. No definite endocarditis visualized. The aortic valve is calcified but there is not a definite vegetation. If high suspicion, needs TEE. FINDINGS  Left Ventricle: Left ventricular ejection fraction, by estimation, is 55 to 60%. The left ventricle has normal function. The left ventricle has no regional wall motion abnormalities. The left ventricular internal cavity size was normal in size. There is  no left  ventricular hypertrophy. Left ventricular diastolic parameters are consistent with Grade I diastolic dysfunction (impaired relaxation). Right Ventricle: The right ventricular size is normal. No increase in right ventricular wall thickness. Right ventricular systolic function is normal. There is normal pulmonary artery systolic pressure. The tricuspid regurgitant velocity is 2.54 m/s, and  with an assumed right atrial pressure of 3 mmHg, the estimated right ventricular systolic pressure is 79.8 mmHg. Left Atrium: Left atrial size was mildly dilated. Right Atrium: Right atrial size was normal in size. Pericardium: There is no evidence of pericardial effusion. Mitral Valve: The mitral valve is normal in structure. Trivial mitral valve regurgitation. No evidence of mitral valve stenosis. Tricuspid Valve: The tricuspid valve is normal in structure. Tricuspid valve regurgitation is trivial. Aortic Valve: The aortic valve is tricuspid. Aortic valve regurgitation is not visualized. Mild aortic valve sclerosis is present, with no evidence of aortic valve stenosis. Aortic valve mean gradient measures 8.0 mmHg. Aortic valve peak gradient measures 16.2 mmHg.  Aortic valve area, by VTI measures 1.75 cm. Pulmonic Valve: The pulmonic valve was normal in structure. Pulmonic valve regurgitation is trivial. Aorta: The aortic root is normal in size and structure. Venous: The inferior vena cava is normal in size with greater than 50% respiratory variability, suggesting right atrial pressure of 3 mmHg. IAS/Shunts: No atrial level shunt detected by color flow Doppler.  LEFT VENTRICLE PLAX 2D LVIDd:         4.70 cm   Diastology LVIDs:         3.10 cm   LV e' medial:    9.02 cm/s LV PW:         1.00 cm   LV E/e' medial:  8.7 LV IVS:        0.70 cm   LV e' lateral:   15.30 cm/s LVOT diam:     2.00 cm   LV E/e' lateral: 5.1 LV SV:         71 LV SV Index:   39 LVOT Area:     3.14 cm  RIGHT VENTRICLE RV Basal diam:  2.80 cm LEFT ATRIUM            Index        RIGHT ATRIUM           Index LA diam:      3.30 cm 1.79 cm/m   RA Area:     13.40 cm LA Vol (A2C): 59.9 ml 32.57 ml/m  RA Volume:   25.80 ml  14.03 ml/m LA Vol (A4C): 21.0 ml 11.42 ml/m  AORTIC VALVE AV Area (Vmax):    1.71 cm AV Area (Vmean):   1.74 cm AV Area (VTI):     1.75 cm AV Vmax:           201.00 cm/s AV Vmean:          128.000 cm/s AV VTI:            0.406 m AV Peak Grad:      16.2 mmHg AV Mean Grad:      8.0 mmHg LVOT Vmax:         109.15 cm/s LVOT Vmean:        70.950 cm/s LVOT VTI:          0.226 m LVOT/AV VTI ratio: 0.56  AORTA Ao Root diam: 3.20 cm Ao Asc diam:  3.50 cm MITRAL VALVE               TRICUSPID VALVE MV Area (PHT): 3.27 cm    TR Peak grad:   25.8 mmHg MV Decel Time: 232 msec    TR Vmax:        254.00 cm/s MV E velocity: 78.70 cm/s MV A velocity: 90.80 cm/s  SHUNTS MV E/A ratio:  0.87        Systemic VTI:  0.23 m                            Systemic Diam: 2.00 cm Dalton McleanMD Electronically signed by Franki Monte Signature Date/Time: 07/02/2021/3:37:58 PM    Final    US Abdomen Limited RUQ (LIVER/GB)  Result Date: 06/30/2021 CLINICAL DATA:  Liver mass on CT. EXAM: ULTRASOUND ABDOMEN LIMITED RIGHT UPPER QUADRANT COMPARISON:  CT abdomen pelvis 06/30/2021 FINDINGS: Gallbladder: No gallstones or wall thickening visualized. No sonographic Murphy sign noted by sonographer. Common bile duct: Diameter: 4 mm. Liver: No focal hepatic lesion. Slightly heterogeneous parenchymal  echogenicity. Portal vein is patent on color Doppler imaging with normal direction of blood flow towards the liver. Other: None. IMPRESSION: Slightly heterogeneous hepatic parenchyma with no sonographic finding of focal hepatic lesion. Recommend MRI liver protocol for further evaluation given CT abdomen pelvis 06/30/2021 findings. When the patient is clinically stable and able to follow directions and hold their breath (preferably as an outpatient) further evaluation with dedicated abdominal MRI  should be considered. Electronically Signed   By: Iven Finn M.D.   On: 06/30/2021 23:41    ASSESSMENT AND PLAN: 1.  Coagulopathy 2.  Liver lesions -06/30/2021 CT abdomen/pelvis-diffusely heterogeneous liver parenchyma with suggestion of innumerable low-density lesions, small cyst in the pancreas, multiple bilateral renal cysts, enlarged prostate gland with nodular projection into the bladder base. -06/30/2021 PSA was 0.51 -07/01/2021 MRI of the abdomen-innumerable lesions throughout the liver which appear to be diffusion positive and worrisome for metastatic disease -07/01/2021 AFP was 1.4 -07/10/2021 CT chest-no definite findings to suggest metastatic disease to the lungs, stable 4 mm subpleural nodule favored to be benign 3.  Fever, resolved 4.  Mild normocytic anemia 5.  Leukocytosis 6.  Enlarged prostate 7.  Pancreatic cyst, renal cysts, splenic cyst 8.  CKD 9.  Hypertension 10.  Dyslipidemia 11.  Protein calorie malnutrition/weight loss  Shane Lam appears unchanged. His PT from this morning was 26.7 and INR was 2.5.  Initial PT and PTT mixing studies sent to [facility] on 07/10/2021 did not show any PT or PTT abnormality and therefore the mixing studies were not indicated.  These results were discordant with lab values obtained from the [facility] lab.  A repeat PT and PTT mixing study were sent on 07/11/2021 and results are currently pending.  Additional work-up including a factor II, factor V, and factor X assay were all normal.  Angiotensin-converting enzyme is also normal.  His lupus anticoagulant is still pending.  It is possible that he has a lupus anticoagulant which is interfering with our reagent.  We plan to await the pending work-up to determine the etiology of his coagulopathy.  If we are unable to determine the cause for his coagulopathy, will consider referral to [facility] hematology.  The etiology of his liver lesions remains unclear.  AFP level was normal.  CEA and CA  19.9 were sent and these results are still pending.  It is possible that these lesions do not represent malignancy.  He could have acute fatty liver from severe calorie restriction.  Recommendations: 1.  Await results of PT and PTT mixing studies 2.  Await results of lupus anticoagulant 3.  We will follow-up on CEA and CA 19.9 results 4.  Continue vitamin K as previously ordered. 5.  From our standpoint, the patient may be discharged to home with ongoing outpatient follow-up.  We can arrange for a follow-up visit in our office to go over any pending results and make further recommendations regarding his possible coagulopathy.   Future Appointments  Date Time Provider [place]  08/04/2021  9:15 AM Marilynn Rail, Jossie Ng, NP [facility]      LOS: 12 days   Mikey Bussing, DNP, AGPCNP-BC, AOCNP 07/13/21 Shane Lam was interviewed and examined.  His clinical status has improved.  No fever.  I reviewed results of the coagulation testing.  The PT/INR remains elevated, but factor II, 5, and 10 levels are normal.  Mixing studies submitted to [facility] revealed a baseline normal PT and PTT.  The reason for the discrepancy is unclear.  It is possible he has a lupus anticoagulant interfering with the assay here.  The lupus anticoagulant panel is pending.  Shane Lam has no bleeding.  He is stable for discharge from a hematology standpoint.  We will arrange for outpatient follow-up to continue evaluation of the coagulopathy and liver lesions.  I discussed the case with his wife at the bedside.  I was present for greater than 50% of today's visit.  I performed medical decision making.

## 2021-07-14 ENCOUNTER — Other Ambulatory Visit: Payer: Self-pay

## 2021-07-14 DIAGNOSIS — E43 Unspecified severe protein-calorie malnutrition: Secondary | ICD-10-CM

## 2021-07-14 DIAGNOSIS — I10 Essential (primary) hypertension: Secondary | ICD-10-CM

## 2021-07-14 DIAGNOSIS — A419 Sepsis, unspecified organism: Secondary | ICD-10-CM

## 2021-07-14 DIAGNOSIS — R6520 Severe sepsis without septic shock: Secondary | ICD-10-CM

## 2021-07-14 LAB — CANCER ANTIGEN 19-9: CA 19-9: 11 U/mL (ref 0–35)

## 2021-07-14 LAB — FACTOR 7 ASSAY: Factor VII Activity: 51 % (ref 51–186)

## 2021-07-14 LAB — CEA: CEA: 2.2 ng/mL (ref 0.0–4.7)

## 2021-07-15 ENCOUNTER — Other Ambulatory Visit: Payer: Medicare Other | Admitting: *Deleted

## 2021-07-15 ENCOUNTER — Telehealth: Payer: Self-pay | Admitting: *Deleted

## 2021-07-15 DIAGNOSIS — N400 Enlarged prostate without lower urinary tract symptoms: Secondary | ICD-10-CM | POA: Diagnosis not present

## 2021-07-15 DIAGNOSIS — R160 Hepatomegaly, not elsewhere classified: Secondary | ICD-10-CM | POA: Diagnosis not present

## 2021-07-15 DIAGNOSIS — K8689 Other specified diseases of pancreas: Secondary | ICD-10-CM | POA: Diagnosis not present

## 2021-07-15 DIAGNOSIS — D734 Cyst of spleen: Secondary | ICD-10-CM | POA: Diagnosis not present

## 2021-07-15 DIAGNOSIS — D72829 Elevated white blood cell count, unspecified: Secondary | ICD-10-CM | POA: Diagnosis not present

## 2021-07-15 DIAGNOSIS — E269 Hyperaldosteronism, unspecified: Secondary | ICD-10-CM | POA: Diagnosis not present

## 2021-07-15 DIAGNOSIS — N281 Cyst of kidney, acquired: Secondary | ICD-10-CM | POA: Diagnosis not present

## 2021-07-15 LAB — PTT FACTOR INHIBITOR (MIXING STUDY)
aPTT 1:1 NP Incub. Mix Ctl: 43.7 s — ABNORMAL HIGH (ref 22.9–30.2)
aPTT 1:1 NP Mix, 60 Min,Incub.: 44.4 s — ABNORMAL HIGH (ref 22.9–30.2)
aPTT 1:1 Normal Plasma: 37.1 s — ABNORMAL HIGH (ref 22.9–30.2)
aPTT: 38.3 s — ABNORMAL HIGH (ref 22.9–30.2)

## 2021-07-15 LAB — PT FACTOR INHIBITOR (MIXING STUDY)
PT 1:1NP: 11 s (ref 9.1–12.0)
PT: 11.4 s (ref 9.1–12.0)

## 2021-07-15 NOTE — Patient Outreach (Signed)
[facility]) Care Management  07/15/2021  Jaxxen Voong April 14, 1951 [id number]  Initial telephone outreach, hospital referral.  Talked with Mr. And Mrs. Mangas. Educated about [facility] care management services. Wife is a retired Therapist, sports and does not feel like the service is needed. Advised will send our information and call again the first week of December to re-evaluate their willingness to participate.  Eulah Pont. Myrtie Neither, MSN, [facility] Gerontological Nurse Practitioner [facility] Care Management [phone]

## 2021-07-15 NOTE — Telephone Encounter (Signed)
Shane Lam was contacted by telephone to verify understanding of discharge instructions status post their most recent discharge from the hospital on the date:  07/13/21.  Inpatient discharge AVS was re-reviewed with wife, along with cancer center appointments and contact information..    Transportation to appointments were confirmed for the patient as being self/caregiver.  [name]'s questions were addressed to their satisfaction upon completion of this post discharge follow-up call for outpatient oncology. [name] reports he did not need home health for any equipment and they have a walker in home if needed. Took him to PCP today and meds/BP was reviewed. Confirmed he took his last Vitamin K today. She is giving him MiraLax and Dulcolox for constipation.

## 2021-07-16 LAB — MULTIPLE MYELOMA PANEL, SERUM
Albumin SerPl Elph-Mcnc: 2.9 g/dL (ref 2.9–4.4)
Albumin/Glob SerPl: 1.1 (ref 0.7–1.7)
Alpha 1: 0.3 g/dL (ref 0.0–0.4)
Alpha2 Glob SerPl Elph-Mcnc: 0.5 g/dL (ref 0.4–1.0)
B-Globulin SerPl Elph-Mcnc: 0.8 g/dL (ref 0.7–1.3)
Gamma Glob SerPl Elph-Mcnc: 1.3 g/dL (ref 0.4–1.8)
Globulin, Total: 2.9 g/dL (ref 2.2–3.9)
IgA: 178 mg/dL (ref 61–437)
IgG (Immunoglobin G), Serum: 1285 mg/dL (ref 603–1613)
IgM (Immunoglobulin M), Srm: 143 mg/dL (ref 15–143)
M Protein SerPl Elph-Mcnc: 0.7 g/dL — ABNORMAL HIGH
Total Protein ELP: 5.8 g/dL — ABNORMAL LOW (ref 6.0–8.5)

## 2021-07-17 ENCOUNTER — Encounter: Payer: Self-pay | Admitting: Nurse Practitioner

## 2021-07-17 ENCOUNTER — Other Ambulatory Visit: Payer: Self-pay

## 2021-07-17 ENCOUNTER — Inpatient Hospital Stay: Payer: Medicare Other

## 2021-07-17 ENCOUNTER — Telehealth: Payer: Self-pay

## 2021-07-17 ENCOUNTER — Inpatient Hospital Stay: Payer: Medicare Other | Attending: Nurse Practitioner | Admitting: Nurse Practitioner

## 2021-07-17 VITALS — BP 156/76 | HR 57 | Temp 97.8°F | Resp 18 | Ht 68.0 in | Wt 156.0 lb

## 2021-07-17 DIAGNOSIS — K769 Liver disease, unspecified: Secondary | ICD-10-CM | POA: Diagnosis not present

## 2021-07-17 DIAGNOSIS — D689 Coagulation defect, unspecified: Secondary | ICD-10-CM | POA: Diagnosis not present

## 2021-07-17 LAB — CBC WITH DIFFERENTIAL (CANCER CENTER ONLY)
Abs Immature Granulocytes: 0.02 10*3/uL (ref 0.00–0.07)
Basophils Absolute: 0.1 10*3/uL (ref 0.0–0.1)
Basophils Relative: 1 %
Eosinophils Absolute: 0.8 10*3/uL — ABNORMAL HIGH (ref 0.0–0.5)
Eosinophils Relative: 9 %
HCT: 36.2 % — ABNORMAL LOW (ref 39.0–52.0)
Hemoglobin: 11.8 g/dL — ABNORMAL LOW (ref 13.0–17.0)
Immature Granulocytes: 0 %
Lymphocytes Relative: 15 %
Lymphs Abs: 1.3 10*3/uL (ref 0.7–4.0)
MCH: 29.7 pg (ref 26.0–34.0)
MCHC: 32.6 g/dL (ref 30.0–36.0)
MCV: 91.2 fL (ref 80.0–100.0)
Monocytes Absolute: 0.6 10*3/uL (ref 0.1–1.0)
Monocytes Relative: 7 %
Neutro Abs: 6.3 10*3/uL (ref 1.7–7.7)
Neutrophils Relative %: 68 %
Platelet Count: 365 10*3/uL (ref 150–400)
RBC: 3.97 MIL/uL — ABNORMAL LOW (ref 4.22–5.81)
RDW: 14.6 % (ref 11.5–15.5)
WBC Count: 9 10*3/uL (ref 4.0–10.5)
nRBC: 0 % (ref 0.0–0.2)

## 2021-07-17 LAB — APTT: aPTT: 49 seconds — ABNORMAL HIGH (ref 24–36)

## 2021-07-17 LAB — PROTIME-INR
INR: 2.1 — ABNORMAL HIGH (ref 0.8–1.2)
Prothrombin Time: 23.4 seconds — ABNORMAL HIGH (ref 11.4–15.2)

## 2021-07-17 NOTE — Telephone Encounter (Signed)
please ask lab to add PT mixing study, PTT to labs already done--orders are in .. spoke with lab order is place

## 2021-07-17 NOTE — Progress Notes (Signed)
[facility] OFFICE PROGRESS NOTE   Diagnosis: Liver lesions, coagulopathy  INTERVAL HISTORY:   Shane Lam returns for first outpatient visit since hospital discharge.  Overall he is feeling better.  No fever since hospital discharge.  He denies bleeding.  No rash.  No joint pain or swelling.  He continues to be fatigued.  He reports a good appetite.  His wife noted a few dark areas in his stool this morning.  She wonders if this may have been related to eating kale last night.  Objective:  Vital signs in last 24 hours:  Blood pressure (!) 133/97, pulse (!) 57, temperature 97.8 F (36.6 C), temperature source Oral, resp. rate 18, height 5\' 8"  (1.727 m), weight 156 lb (70.8 kg), SpO2 100 %.    Lymphatics: No palpable cervical or supraclavicular lymph nodes. Resp: Lungs clear bilaterally. Cardio: Regular rate and rhythm. GI: Abdomen soft and nontender.  No hepatosplenomegaly. Vascular: No leg edema.   Lab Results:  Lab Results  Component Value Date   WBC 9.0 07/17/2021   HGB 11.8 (L) 07/17/2021   HCT 36.2 (L) 07/17/2021   MCV 91.2 07/17/2021   PLT 365 07/17/2021   NEUTROABS 6.3 07/17/2021    Imaging:  No results found.  Medications: I have reviewed the patient's current medications.  Assessment/Plan: 1.  Coagulopathy 2.  Liver lesions -06/30/2021 CT abdomen/pelvis-diffusely heterogeneous liver parenchyma with suggestion of innumerable low-density lesions, small cyst in the pancreas, multiple bilateral renal cysts, enlarged prostate gland with nodular projection into the bladder base. -06/30/2021 PSA was 0.51 -07/01/2021 MRI of the abdomen-innumerable lesions throughout the liver which appear to be diffusion positive and worrisome for metastatic disease -07/01/2021 AFP was 1.4 -07/10/2021 CT chest-no definite findings to suggest metastatic disease to the lungs, stable 4 mm subpleural nodule favored to be benign 3.  Fever, resolved 4.  Mild normocytic  anemia 5.  Leukocytosis 6.  Enlarged prostate 7.  Pancreatic cyst, renal cysts, splenic cyst 8.  CKD 9.  Hypertension 10.  Dyslipidemia 11.  Protein calorie malnutrition/weight loss 12.  Myeloma panel 07/11/2021-M spike 0.7, IgG monoclonal protein with kappa light chain specificity, immunoglobulins in normal range  Disposition: Shane Lam appears stable.  The PT is lower but remains elevated.  Dr. Benay Spice reviewed the PT result from today as well as coagulation studies obtained in the hospital with Shane Lam and his wife.  He may have a lupus anticoagulant.  We are adding a PTT to today's labs.  Biopsy of a liver lesion remains on hold pending further evaluation of coagulopathy.  He had a myeloma panel while in the hospital.  Immunofixation showed IgG monoclonal protein with kappa light chain specificity.  Likely monoclonal gammopathy of undetermined significance.  Results reviewed with Shane Lam and his wife.  He will return for lab and follow-up the week of 07/27/2021.  We are available to see him sooner if needed.  Patient seen with Dr. Benay Spice.   Ned Card ANP/GNP-BC   07/17/2021  1:23 PM   This was a shared visit with Ned Card.  Shane Lam was interviewed and examined.  His clinical status appears significantly improved compared to when he was hospitalized last week.  No further fever.  Good appetite.  He has persistent elevation of the PT and PTT.  The coagulation studies are most consistent with presence of a lupus anticoagulant.  He has a low level serum M spike, likely a monoclonal gammopathy of unknown significance.  There is no clear  evidence of a malignancy.  We decided to follow him with observation for several more weeks prior to deciding on the indication for liver biopsy.  We will consider repeat imaging of the liver or a PET scan prior to a biopsy.  Shane Lam and his wife are comfortable with this approach.  I was greater than 50% of today's visit.   I performed medical decision making.  Julieanne Manson, MD

## 2021-07-20 DIAGNOSIS — I10 Essential (primary) hypertension: Secondary | ICD-10-CM | POA: Diagnosis not present

## 2021-07-20 DIAGNOSIS — R634 Abnormal weight loss: Secondary | ICD-10-CM | POA: Diagnosis not present

## 2021-07-20 DIAGNOSIS — Z6823 Body mass index (BMI) 23.0-23.9, adult: Secondary | ICD-10-CM | POA: Diagnosis not present

## 2021-07-20 DIAGNOSIS — E876 Hypokalemia: Secondary | ICD-10-CM | POA: Diagnosis not present

## 2021-07-20 DIAGNOSIS — E269 Hyperaldosteronism, unspecified: Secondary | ICD-10-CM | POA: Diagnosis not present

## 2021-07-20 DIAGNOSIS — N281 Cyst of kidney, acquired: Secondary | ICD-10-CM | POA: Diagnosis not present

## 2021-07-29 DIAGNOSIS — E785 Hyperlipidemia, unspecified: Secondary | ICD-10-CM | POA: Diagnosis not present

## 2021-07-30 ENCOUNTER — Inpatient Hospital Stay: Payer: Medicare Other

## 2021-07-30 ENCOUNTER — Other Ambulatory Visit: Payer: Self-pay

## 2021-07-30 ENCOUNTER — Inpatient Hospital Stay: Payer: Medicare Other | Attending: Oncology | Admitting: Oncology

## 2021-07-30 VITALS — BP 137/99 | HR 80 | Temp 98.1°F | Resp 20 | Ht 68.0 in | Wt 150.8 lb

## 2021-07-30 DIAGNOSIS — D689 Coagulation defect, unspecified: Secondary | ICD-10-CM

## 2021-07-30 DIAGNOSIS — K769 Liver disease, unspecified: Secondary | ICD-10-CM | POA: Insufficient documentation

## 2021-07-30 LAB — CBC WITH DIFFERENTIAL (CANCER CENTER ONLY)
Abs Immature Granulocytes: 0.03 10*3/uL (ref 0.00–0.07)
Basophils Absolute: 0 10*3/uL (ref 0.0–0.1)
Basophils Relative: 1 %
Eosinophils Absolute: 0.3 10*3/uL (ref 0.0–0.5)
Eosinophils Relative: 4 %
HCT: 42.2 % (ref 39.0–52.0)
Hemoglobin: 13.4 g/dL (ref 13.0–17.0)
Immature Granulocytes: 0 %
Lymphocytes Relative: 14 %
Lymphs Abs: 1.1 10*3/uL (ref 0.7–4.0)
MCH: 29.7 pg (ref 26.0–34.0)
MCHC: 31.8 g/dL (ref 30.0–36.0)
MCV: 93.6 fL (ref 80.0–100.0)
Monocytes Absolute: 0.5 10*3/uL (ref 0.1–1.0)
Monocytes Relative: 6 %
Neutro Abs: 6.3 10*3/uL (ref 1.7–7.7)
Neutrophils Relative %: 75 %
Platelet Count: 239 10*3/uL (ref 150–400)
RBC: 4.51 MIL/uL (ref 4.22–5.81)
RDW: 15.1 % (ref 11.5–15.5)
WBC Count: 8.3 10*3/uL (ref 4.0–10.5)
nRBC: 0 % (ref 0.0–0.2)

## 2021-07-30 LAB — CMP (CANCER CENTER ONLY)
ALT: 25 U/L (ref 0–44)
AST: 16 U/L (ref 15–41)
Albumin: 4.1 g/dL (ref 3.5–5.0)
Alkaline Phosphatase: 108 U/L (ref 38–126)
Anion gap: 7 (ref 5–15)
BUN: 14 mg/dL (ref 8–23)
CO2: 30 mmol/L (ref 22–32)
Calcium: 10 mg/dL (ref 8.9–10.3)
Chloride: 104 mmol/L (ref 98–111)
Creatinine: 1.11 mg/dL (ref 0.61–1.24)
GFR, Estimated: >60 mL/min (ref >60)
Glucose, Bld: 107 mg/dL — ABNORMAL HIGH (ref 70–99)
Potassium: 3.6 mmol/L (ref 3.5–5.1)
Sodium: 141 mmol/L (ref 135–145)
Total Bilirubin: 0.6 mg/dL (ref 0.3–1.2)
Total Protein: 6.9 g/dL (ref 6.5–8.1)

## 2021-07-30 LAB — NMR, LIPOPROFILE
Cholesterol, Total: 178 mg/dL (ref 100–199)
HDL Particle Number: 26.3 umol/L — ABNORMAL LOW (ref >=30.5)
HDL-C: 44 mg/dL (ref >39)
LDL Particle Number: 1518 nmol/L — ABNORMAL HIGH (ref <1000)
LDL Size: 21.1 nm (ref >20.5)
LDL-C (NIH Calc): 121 mg/dL — ABNORMAL HIGH (ref 0–99)
LP-IR Score: 33 (ref <=45)
Small LDL Particle Number: 612 nmol/L — ABNORMAL HIGH (ref <=527)
Triglycerides: 68 mg/dL (ref 0–149)

## 2021-07-30 LAB — PROTIME-INR
INR: 1.7 — ABNORMAL HIGH (ref 0.8–1.2)
Prothrombin Time: 19.5 seconds — ABNORMAL HIGH (ref 11.4–15.2)

## 2021-07-30 LAB — APTT: aPTT: 75 seconds — ABNORMAL HIGH (ref 24–36)

## 2021-07-30 NOTE — Progress Notes (Signed)
  [facility] OFFICE PROGRESS NOTE   Diagnosis: Liver lesions, coagulopathy  INTERVAL HISTORY:   Shane Lam returns as scheduled.  Shane Lam is here with Shane Lam wife.  Shane Lam feels well.  No fever or sweats.  Good appetite.  Shane Lam is exercising.  Shane Lam has not gained weight.  No bleeding.  Objective:  Vital signs in last 24 hours:  Blood pressure (!) 137/99, pulse 80, temperature 98.1 F (36.7 C), temperature source Oral, resp. rate 20, height 5\' 8"  (1.727 m), weight 150 lb 12.8 oz (68.4 kg), SpO2 100 %.    Lymphatics: No cervical, supraclavicular, axillary, or inguinal nodes Resp: Lungs clear bilaterally Cardio: Regular rate and rhythm GI: No mass, nontender, no hepatosplenomegaly Vascular: No leg edema  Lab Results:  Lab Results  Component Value Date   WBC 8.3 07/30/2021   HGB 13.4 07/30/2021   HCT 42.2 07/30/2021   MCV 93.6 07/30/2021   PLT 239 07/30/2021   NEUTROABS 6.3 07/30/2021    CMP  Lab Results  Component Value Date   NA 141 07/30/2021   K 3.6 07/30/2021   CL 104 07/30/2021   CO2 30 07/30/2021   GLUCOSE 107 (H) 07/30/2021   BUN 14 07/30/2021   CREATININE 1.11 07/30/2021   CALCIUM 10.0 07/30/2021   PROT 6.9 07/30/2021   ALBUMIN 4.1 07/30/2021   AST 16 07/30/2021   ALT 25 07/30/2021   ALKPHOS 108 07/30/2021   BILITOT 0.6 07/30/2021   GFRNONAA >60 07/30/2021      Medications: I have reviewed the patient's current medications.   Assessment/Plan: Coagulopathy-persistently elevated PT and PTT in the [facility] labs 2.  Liver lesions -06/30/2021 CT abdomen/pelvis-diffusely heterogeneous liver parenchyma with suggestion of innumerable low-density lesions, small cyst in the pancreas, multiple bilateral renal cysts, enlarged prostate gland with nodular projection into the bladder base. -06/30/2021 PSA was 0.51 -07/01/2021 MRI of the abdomen-innumerable lesions throughout the liver which appear to be diffusion positive and worrisome for metastatic  disease -07/01/2021 AFP was 1.4 -07/10/2021 CT chest-no definite findings to suggest metastatic disease to the lungs, stable 4 mm subpleural nodule favored to be benign 3.  Fever, resolved 4.  Mild normocytic anemia 5.  Leukocytosis 6.  Enlarged prostate 7.  Pancreatic cyst, renal cysts, splenic cyst 8.  CKD 9.  Hypertension 10.  Dyslipidemia 11.  Protein calorie malnutrition/weight loss 12.  Myeloma panel 07/11/2021-M spike 0.7, IgG monoclonal protein with kappa light chain specificity, immunoglobulins in normal range    Disposition: Shane Lam appears well.  Shane Lam has persistent elevation of the PT and PTT.  I suspect this is secondary to a lupus anticoagulant.  Factor levels have been normal.  Shane Lam was noted to have liver lesions on CT and MRI last month when Shane Lam presented to the hospital last month.  Shane Lam does not have symptoms to suggest a malignancy or infection.  The CBC and liver panel are normal today.  We discussed the indication to proceed with a liver biopsy.  We decided to hold on referring him for a biopsy with the plan to repeat liver imaging in 1-2 months.  Shane Lam will return for an office visit in approximately 5 weeks.  Shane Lam will call for bleeding or new symptoms.  Shane Lam will be referred to the Cancer center nutritionist.  Betsy Coder, MD  07/30/2021  1:18 PM

## 2021-08-02 LAB — PTT FACTOR INHIBITOR (MIXING STUDY)
aPTT 1:1 NP Incub. Mix Ctl: 44.2 s — ABNORMAL HIGH (ref 22.9–30.2)
aPTT 1:1 NP Mix, 60 Min,Incub.: 44.6 s — ABNORMAL HIGH (ref 22.9–30.2)
aPTT 1:1 Normal Plasma: 46.8 s — ABNORMAL HIGH (ref 22.9–30.2)
aPTT: 51 s — ABNORMAL HIGH (ref 22.9–30.2)

## 2021-08-02 LAB — PT FACTOR INHIBITOR (MIXING STUDY)
PT 1:1NP: 10.5 s (ref 9.1–12.0)
PT: 11.3 s (ref 9.1–12.0)

## 2021-08-03 LAB — DRVVT CONFIRM: dRVVT Confirm: 1.6 ratio — ABNORMAL HIGH (ref 0.8–1.2)

## 2021-08-03 LAB — LUPUS ANTICOAGULANT PANEL
DRVVT: 85.5 s — ABNORMAL HIGH (ref 0.0–47.0)
PTT Lupus Anticoagulant: 91 s — ABNORMAL HIGH (ref 0.0–51.9)

## 2021-08-03 LAB — DRVVT MIX: dRVVT Mix: 67.5 s — ABNORMAL HIGH (ref 0.0–40.4)

## 2021-08-03 LAB — PTT-LA MIX: PTT-LA Mix: 77.3 s — ABNORMAL HIGH (ref 0.0–48.9)

## 2021-08-03 LAB — HEXAGONAL PHASE PHOSPHOLIPID: Hexagonal Phase Phospholipid: 4 s (ref 0–11)

## 2021-08-03 NOTE — Progress Notes (Signed)
Cardiology Clinic Note   Patient Name: Shane Lam Date of Encounter: 08/04/2021  Primary Care Provider:  [name], MD Primary Cardiologist:  Pixie Casino, MD  Patient Profile    Shane Lam presents to the clinic today for follow-up evaluation of his hypertension and hyperlipidemia.  Past Medical History    Past Medical History:  Diagnosis Date   Allergic rhinitis due to pollen    Essential hypertension, malignant    Nonspecific abnormal electrocardiogram (ECG) (EKG)    Pure hypercholesterolemia    Past Surgical History:  Procedure Laterality Date   FLEXIBLE SIGMOIDOSCOPY N/A 07/03/2021   Procedure: FLEXIBLE SIGMOIDOSCOPY;  Surgeon: Carol Ada, MD;  Location: [facility];  Service: Endoscopy;  Laterality: N/A;    Allergies  Allergies  Allergen Reactions   Phenergan [Promethazine] Other (See Comments)    Makes Pt delirious    History of Present Illness    Shane Lam has a PMH of elevated coronary calcium score, hyperaldosteronism, hypertension, and dyslipidemia.  He was referred by his PCP for evaluation and management of his hypertension and dyslipidemia.  He reported some recent difficulty with high blood pressure.  He also has Conn syndrome.  He was previously managed by endocrinology and Dr. Shelia Media.  He was placed on spironolactone however, the medication caused issues with his renal function.  He noted issues with hypokalemia related to hyperaldosteronism and required potassium supplementation.  He was on triamterene/HCTZ but that did not help his hypokalemia enough.  He brought in a blood pressure monitor which showed blood pressures in the 242-353 systolic range.  He was noted also to have periods of bradycardia.  It was not felt that he was a good candidate for beta-blocker or calcium channel blocker therapy.  He had recent ultrasound which showed fatty liver although his liver enzymes had remained normal.  His calcium score 3/22 showed a total of  114 which placed him in the 65th percentile for age and sex matched controls.  He was also noted to have a 4 mm lung nodule along the right minor fissure.  Follow-up was recommended.  He was noted to have 3.9 cm a sending aorta aortic aneurysm.  Repeat imaging via CT in 1 year was recommended.  It was felt that if he needed further antihypertensive therapy hydralazine would be the best option.  He presents to the clinic today for follow-up evaluation states he is feeling better after his recent hospitalization.  He had fever of unknown origin has been hospitalized for several days.  It is lipids while in the hospital were much improved.  His follow-up lipids on 07/29/2021 showed an increase in his LDL.  We reviewed the genetic component of his cholesterol and his low LDL.  He expressed understanding.  He does not wish to proceed with medication and wishes to continue to increase his physical activity and increase the fiber in his diet.  We briefly reviewed options for medical therapy with statins and inclisiran.  We reviewed his prior calcium channel scoring.  We reviewed his EKG.  His blood pressure is well controlled today at 118/82.  We will plan follow-up in 4 to 6 months with Dr. Debara Pickett.  Today he denies chest pain, shortness of breath, lower extremity edema, fatigue, palpitations, melena, hematuria, hemoptysis, diaphoresis, weakness, presyncope, syncope, orthopnea, and PND.  Home Medications    Prior to Admission medications   Medication Sig Start Date End Date Taking? Authorizing Provider  felodipine (PLENDIL) 10 MG 24 hr tablet Take 10  mg by mouth daily.    [provider]  Multiple Vitamin (MULTIVITAMIN) tablet Take 1 tablet by mouth daily.      [provider]  potassium chloride SA (KLOR-CON) 20 MEQ tablet Take 20 mEq by mouth daily.    [provider]  spironolactone (ALDACTONE) 25 MG tablet Take 25 mg by mouth daily. 11/21/20   [provider]  tadalafil  (CIALIS) 20 MG tablet Take 20 mg by mouth daily as needed for erectile dysfunction. 06/22/21   [provider]  telmisartan (MICARDIS) 80 MG tablet Take 80 mg by mouth daily. 10/27/20   [provider]    Family History    Family History  Problem Relation Age of Onset   CAD Mother    Brain cancer Mother    CAD Father    Pancreatic cancer Father    Lung cancer Maternal Aunt    He indicated that the status of his mother is unknown. He indicated that the status of his father is unknown. He indicated that the status of his maternal aunt is unknown.  Social History    Social History   Socioeconomic History   Marital status: Married    Spouse name: Not on file   Number of children: Not on file   Years of education: Not on file   Highest education level: Not on file  Occupational History   Not on file  Tobacco Use   Smoking status: Never   Smokeless tobacco: Never  Vaping Use   Vaping Use: Never used  Substance and Sexual Activity   Alcohol use: Never   Drug use: Never   Sexual activity: Not on file  Other Topics Concern   Not on file  Social History Narrative   Not on file   Social Determinants of Health   Financial Resource Strain: Not on file  Food Insecurity: Not on file  Transportation Needs: Not on file  Physical Activity: Not on file  Stress: Not on file  Social Connections: Not on file  Intimate Partner Violence: Not on file     Review of Systems    General:  No chills, fever, night sweats or weight changes.  Cardiovascular:  No chest pain, dyspnea on exertion, edema, orthopnea, palpitations, paroxysmal nocturnal dyspnea. Dermatological: No rash, lesions/masses Respiratory: No cough, dyspnea Urologic: No hematuria, dysuria Abdominal:   No nausea, vomiting, diarrhea, bright red blood per rectum, melena, or hematemesis Neurologic:  No visual changes, wkns, changes in mental status. All other systems reviewed and are otherwise negative  except as noted above.  Physical Exam    VS:  BP 118/82   Pulse 68   Ht 5\' 9"  (1.753 m)   Wt 147 lb (66.7 kg)   SpO2 97%   BMI 21.71 kg/m  , BMI Body mass index is 21.71 kg/m. GEN: Well nourished, well developed, in no acute distress. HEENT: normal. Neck: Supple, no JVD, carotid bruits, or masses. Cardiac: RRR, no murmurs, rubs, or gallops. No clubbing, cyanosis, edema.  Radials/DP/PT 2+ and equal bilaterally.  Respiratory:  Respirations regular and unlabored, clear to auscultation bilaterally. GI: Soft, nontender, nondistended, BS + x 4. MS: no deformity or atrophy. Skin: warm and dry, no rash. Neuro:  Strength and sensation are intact. Psych: Normal affect.  Accessory Clinical Findings    Recent Labs: 07/07/2021: TSH 1.799 07/08/2021: Magnesium 2.0 07/30/2021: ALT 25; BUN 14; Creatinine 1.11; Hemoglobin 13.4; Platelet Count 239; Potassium 3.6; Sodium 141   Recent Lipid Panel  Component Value Date/Time   CHOL 101 07/05/2021 0718   TRIG 75 07/05/2021 0718   HDL 18 (L) 07/05/2021 0718   CHOLHDL 5.6 07/05/2021 0718   VLDL 15 07/05/2021 0718   LDLCALC 68 07/05/2021 0718    ECG personally reviewed by me today-normal sinus rhythm with sinus arrhythmia septal infarct undetermined age 31 bpm- No acute changes  Echocardiogram 07/02/2021 IMPRESSIONS     1. Left ventricular ejection fraction, by estimation, is 55 to 60%. The  left ventricle has normal function. The left ventricle has no regional  wall motion abnormalities. Left ventricular diastolic parameters are  consistent with Grade I diastolic  dysfunction (impaired relaxation).   2. Right ventricular systolic function is normal. The right ventricular  size is normal. There is normal pulmonary artery systolic pressure. The  estimated right ventricular systolic pressure is 09.8 mmHg.   3. Left atrial size was mildly dilated.   4. The mitral valve is normal in structure. Trivial mitral valve  regurgitation. No evidence  of mitral stenosis.   5. The aortic valve is tricuspid. Aortic valve regurgitation is not  visualized. Mild aortic valve sclerosis is present, with no evidence of  aortic valve stenosis.   6. The inferior vena cava is normal in size with greater than 50%  respiratory variability, suggesting right atrial pressure of 3 mmHg.   7. No definite endocarditis visualized. The aortic valve is calcified but  there is not a definite vegetation. If high suspicion, needs TEE.   CT CARDIAC CORONARY ARTERY CALCIUM SCORE   TECHNIQUE: Non-contrast imaging through the heart was performed using prospective ECG gating. Image post processing was performed on an independent workstation, allowing for quantitative analysis of the heart and coronary arteries. Note that this exam targets the heart and the chest was not imaged in its entirety.   COMPARISON:  None.   FINDINGS: CORONARY CALCIUM SCORES:   Left Main: 27.7   LAD: 12.7   LCx: 13.4   RCA: 60.2   Total Agatston Score: 114   MESA database percentile: 65   AORTA MEASUREMENTS:   Ascending Aorta: 39 mm   Descending Aorta: 31 mm   OTHER FINDINGS:   Heart is normal size. Ascending aorta upper limits normal at 3.9 cm. No adenopathy. Scattered calcifications in the descending thoracic aorta. Triangular nodule seen along the minor fissure in the right middle lobe measures 4 mm and felt represent scar intrapulmonary lymph node given the shape and location. No confluent opacities or effusions. Imaging into the upper abdomen demonstrates no acute findings. Chest wall soft tissues are unremarkable. No acute bony abnormality.   IMPRESSION: Total Agatston score: [place] percentile: 65   4 mm nodule along the right minor fissure felt to most likely reflect intrapulmonary lymph node or scar. No follow-up needed if patient is low-risk. Non-contrast chest CT can be considered in 12 months if patient is high-risk. This  recommendation follows the consensus statement: Guidelines for Management of Incidental Pulmonary Nodules Detected on CT Images: From the Fleischner Society 2017; Radiology 2017; 284:228-243.     Electronically Signed   By: Rolm Baptise M.D.   On: 11/12/2020 11:28  Assessment & Plan   1.  Essential hypertension-BP today 118/82.   Continue spironolactone, telmisartan Heart healthy low-sodium diet-salty 6 given Increase physical activity as tolerated Maintain blood pressure log  Hyperlipidemia-07/05/2021: Cholesterol 101; HDL 18; LDL Cholesterol 68; Triglycerides 75; VLDL 15.  Elevated calcium score.  Details above.  He wishes to  defer statin medication at this time.  Briefly discussed inclisiran we will work on increasing physical activity and increasing fiber. Heart healthy low-sodium high-fiber diet Increase physical activity as tolerated Repeat fasting lipids in 4-5 months  Ascending aortic dilation-noted to be 39 mm on cardiac CT.  Also noted to have pulmonary nodule. Recommend repeat CT in 1 year  Conn syndrome/hyperaldosteronism-previously did not tolerate increased dosing of spironolactone due to renal function. Continue spironolactone, potassium Heart healthy low-sodium diet-salty 6 given Increase physical activity as tolerated  Disposition: Follow-up with Dr. Debara Pickett in 4-6 months.  Jossie Ng. Dazha Kempa NP-C    08/04/2021, 9:16 AM [address] Office [phone] Fax [phone]  Notice: This dictation was prepared with Dragon dictation along with smaller phrase technology. Any transcriptional errors that result from this process are unintentional and may not be corrected upon review.  I spent 15 minutes examining this patient, reviewing medications, and using patient centered shared decision making involving her cardiac care.  Prior to her visit I spent greater than 20 minutes reviewing her past medical history,   medications, and prior cardiac tests.

## 2021-08-04 ENCOUNTER — Encounter: Payer: Self-pay | Admitting: General Practice

## 2021-08-04 ENCOUNTER — Other Ambulatory Visit: Payer: Self-pay

## 2021-08-04 ENCOUNTER — Ambulatory Visit (INDEPENDENT_AMBULATORY_CARE_PROVIDER_SITE_OTHER): Payer: Medicare Other | Admitting: General Practice

## 2021-08-04 VITALS — BP 118/82 | HR 68 | Ht 69.0 in | Wt 147.0 lb

## 2021-08-04 DIAGNOSIS — I10 Essential (primary) hypertension: Secondary | ICD-10-CM

## 2021-08-04 DIAGNOSIS — E785 Hyperlipidemia, unspecified: Secondary | ICD-10-CM | POA: Diagnosis not present

## 2021-08-04 DIAGNOSIS — I7121 Aneurysm of the ascending aorta, without rupture: Secondary | ICD-10-CM

## 2021-08-04 DIAGNOSIS — E269 Hyperaldosteronism, unspecified: Secondary | ICD-10-CM | POA: Diagnosis not present

## 2021-08-04 NOTE — Patient Instructions (Signed)
Medication Instructions:  The current medical regimen is effective;  continue present plan and medications as directed. Please refer to the Current Medication list given to you today.   *If you need a refill on your cardiac medications before your next appointment, please call your pharmacy*  Lab Work:   Testing/Procedures:  NONE    NONE  Special Instructions PLEASE READ AND FOLLOW INCREASED FIBER DIET ATTACHED  PLEASE INCREASE PHYSICAL ACTIVITY AS TOLERATED   Follow-Up: Your next appointment:  4-6 month(s) In Person with Pixie Casino, MD   At [facility], you and your health needs are our priority.  As part of our continuing mission to provide you with exceptional heart care, we have created designated Provider Care Teams.  These Care Teams include your primary Cardiologist (physician) and Advanced Practice Providers (APPs -  Physician Assistants and Nurse Practitioners) who all work together to provide you with the care you need, when you need it.        High-Fiber Eating Plan Fiber, also called dietary fiber, is a type of carbohydrate. It is found foods such as fruits, vegetables, whole grains, and beans. A high-fiber diet can have many health benefits. Your health care provider may recommend a high-fiber diet to help: Prevent constipation. Fiber can make your bowel movements more regular. Lower your cholesterol. Relieve the following conditions: Inflammation of veins in the anus (hemorrhoids). Inflammation of specific areas of the digestive tract (uncomplicated diverticulosis). A problem of the large intestine, also called the colon, that sometimes causes pain and diarrhea (irritable bowel syndrome, or IBS). Prevent overeating as part of a weight-loss plan. Prevent heart disease, type 2 diabetes, and certain cancers. What are tips for following this plan? Reading food labels  Check the nutrition facts label on food products for the amount of dietary fiber. Choose foods that  have 5 grams of fiber or more per serving. The goals for recommended daily fiber intake include: Men (age 70 or younger): 34-38 g. Men (over age 40): 28-34 g. Women (age 38 or younger): 25-28 g. Women (over age 47): 22-25 g. Your daily fiber goal is _____________ g. Shopping Choose whole fruits and vegetables instead of processed forms, such as apple juice or applesauce. Choose a wide variety of high-fiber foods such as avocados, lentils, oats, and kidney beans. Read the nutrition facts label of the foods you choose. Be aware of foods with added fiber. These foods often have high sugar and sodium amounts per serving. Cooking Use whole-grain flour for baking and cooking. Cook with brown rice instead of white rice. Meal planning Start the day with a breakfast that is high in fiber, such as a cereal that contains 5 g of fiber or more per serving. Eat breads and cereals that are made with whole-grain flour instead of refined flour or white flour. Eat brown rice, bulgur wheat, or millet instead of white rice. Use beans in place of meat in soups, salads, and pasta dishes. Be sure that half of the grains you eat each day are whole grains. General information You can get the recommended daily intake of dietary fiber by: Eating a variety of fruits, vegetables, grains, nuts, and beans. Taking a fiber supplement if you are not able to take in enough fiber in your diet. It is better to get fiber through food than from a supplement. Gradually increase how much fiber you consume. If you increase your intake of dietary fiber too quickly, you may have bloating, cramping, or gas. Drink plenty  of water to help you digest fiber. Choose high-fiber snacks, such as berries, raw vegetables, nuts, and popcorn. What foods should I eat? Fruits Berries. Pears. Apples. Oranges. Avocado. Prunes and raisins. Dried figs. Vegetables Sweet potatoes. Spinach. Kale. Artichokes. Cabbage. Broccoli. Cauliflower. Green  peas. Carrots. Squash. Grains Whole-grain breads. Multigrain cereal. Oats and oatmeal. Brown rice. Barley. Bulgur wheat. [place]. Quinoa. Bran muffins. Popcorn. Rye wafer crackers. Meats and other proteins Navy beans, kidney beans, and pinto beans. Soybeans. Split peas. Lentils. Nuts and seeds. Dairy Fiber-fortified yogurt. Beverages Fiber-fortified soy milk. Fiber-fortified orange juice. Other foods Fiber bars. The items listed above may not be a complete list of recommended foods and beverages. Contact a dietitian for more information. What foods should I avoid? Fruits Fruit juice. Cooked, strained fruit. Vegetables Fried potatoes. Canned vegetables. Well-cooked vegetables. Grains White bread. Pasta made with refined flour. White rice. Meats and other proteins Fatty cuts of meat. Fried chicken or fried fish. Dairy Milk. Yogurt. Cream cheese. Sour cream. Fats and oils Butters. Beverages Soft drinks. Other foods Cakes and pastries. The items listed above may not be a complete list of foods and beverages to avoid. Talk with your dietitian about what choices are best for you. Summary Fiber is a type of carbohydrate. It is found in foods such as fruits, vegetables, whole grains, and beans. A high-fiber diet has many benefits. It can help to prevent constipation, lower blood cholesterol, aid weight loss, and reduce your risk of heart disease, diabetes, and certain cancers. Increase your intake of fiber gradually. Increasing fiber too quickly may cause cramping, bloating, and gas. Drink plenty of water while you increase the amount of fiber you consume. The best sources of fiber include whole fruits and vegetables, whole grains, nuts, seeds, and beans. This information is not intended to replace advice given to you by your health care provider. Make sure you discuss any questions you have with your health care provider. Document Revised: 12/20/2019 Document Reviewed:  12/20/2019 Elsevier Patient Education  2022 [employer].

## 2021-08-05 ENCOUNTER — Other Ambulatory Visit: Payer: Self-pay | Admitting: *Deleted

## 2021-08-05 ENCOUNTER — Inpatient Hospital Stay: Payer: Medicare Other | Admitting: Nutrition

## 2021-08-05 NOTE — Progress Notes (Signed)
70 year old male diagnosed with liver lesions, coagulopathy.   PMH includes CKD, HTN, and Dyslipidemia.  Medications include Vitamin D and MVI.  Labs include Glucose 107.  Height: 5'9". Weight: 147 pounds on Dec 6. UBW: 167 pounds August 2022. BMI: 21.71.  Patient diagnosed with severe Malnutrition during recent hospitalization. Physical exam revealed severe fat depletion and moderate muscle depletion. He was consuming [employer] 1.4 BID during hospitalization but states he does not like it and is no longer consuming it. He does not consume soy or dairy products. He is eating a plant based diet and includes [place], chicken and fish.  He eats 6 "meals" daily.  Nutrition Diagnosis: Food and Nutrition Related Knowledge Deficit related to chronic illness/severe malnutrition as evidenced by patient questions on how to gain weight.  Intervention: Educated on foods to increase to gain weight while still maintaining a healthy plant based diet. Incorporate more healthy grains/fiber as well as healthy fats. Make healthy shakes using high calorie foods within patient's accepted food list.  Monitoring, Evaluation, Goals: Wt gain/repletion  No follow up.  Patient will need to see a Dietitian in private practice or with nutrition diet and education services. (He is not an oncology patient at this time.)

## 2021-08-05 NOTE — Patient Outreach (Signed)
[facility]) Care Management  08/05/2021  Shane Lam 05-17-1951 [id number]   Telephone outreach following successful outreach letter being sent. NP talked with Shane Lam, who verified that they had received our letter and brochure. She continues to report they are doing well and will call if a need arises in the future.   She had called our nurse line to inquire about a nutritional consult and the nurse had called this NP who advised that they be referred to the [facility] and [place]. Shane Lam reports that she was able to inquire about that and found that the cost of the consult would not be covered. She subsequently talked with Shane Lam, oncologist who referred them to the oncology nutritionist which they will see today.  Shane Lam. Shane Neither, MSN, [facility] Gerontological Nurse Practitioner [facility] Care Management [phone]

## 2021-08-07 ENCOUNTER — Encounter: Payer: Self-pay | Admitting: Oncology

## 2021-08-10 ENCOUNTER — Telehealth: Payer: Self-pay

## 2021-08-10 NOTE — Telephone Encounter (Signed)
TC to the Pt.  he can take tadalafil not to exceed a 10 mg per Dr. Benay Spice Verbalizing understanding

## 2021-08-10 NOTE — Telephone Encounter (Signed)
-----   Message from Owens Shark, NP sent at 08/10/2021 11:23 AM EST ----- Per Dr. Berle Mull aware of any problems with taking tadalafil but would not exceed a 10 mg dose.

## 2021-08-25 ENCOUNTER — Encounter: Payer: Self-pay | Admitting: Internal Medicine

## 2021-09-03 ENCOUNTER — Inpatient Hospital Stay: Payer: Medicare Other | Attending: Oncology | Admitting: Oncology

## 2021-09-03 ENCOUNTER — Other Ambulatory Visit: Payer: Self-pay

## 2021-09-03 VITALS — BP 154/93 | HR 65 | Temp 97.8°F | Resp 20 | Ht 69.0 in | Wt 151.0 lb

## 2021-09-03 DIAGNOSIS — D6862 Lupus anticoagulant syndrome: Secondary | ICD-10-CM | POA: Diagnosis not present

## 2021-09-03 DIAGNOSIS — K769 Liver disease, unspecified: Secondary | ICD-10-CM | POA: Diagnosis not present

## 2021-09-03 DIAGNOSIS — D689 Coagulation defect, unspecified: Secondary | ICD-10-CM | POA: Diagnosis not present

## 2021-09-03 NOTE — Progress Notes (Signed)
°  [place] OFFICE PROGRESS NOTE   Diagnosis: Lupus anticoagulant, cervical spine, liver lesions on CT and MRI  INTERVAL HISTORY:   Shane Lam returns as scheduled.  He feels well.  No complaint.  Good appetite.  He is exercising.  No fever or bleeding.  Objective:  Vital signs in last 24 hours:  Blood pressure (!) 154/93, pulse 65, temperature 97.8 F (36.6 C), temperature source Oral, resp. rate 20, height 5\' 9"  (1.753 m), weight 151 lb (68.5 kg), SpO2 100 %.    Lymphatics: "Shotty "bilateral cervical/scalene and axillary nodes Resp: Lungs clear bilaterally Cardio: Regular rate and rhythm GI: No hepatosplenomegaly, no mass, nontender Vascular: No leg edema   Lab Results:  Lab Results  Component Value Date   WBC 8.3 07/30/2021   HGB 13.4 07/30/2021   HCT 42.2 07/30/2021   MCV 93.6 07/30/2021   PLT 239 07/30/2021   NEUTROABS 6.3 07/30/2021    CMP  Lab Results  Component Value Date   NA 141 07/30/2021   K 3.6 07/30/2021   CL 104 07/30/2021   CO2 30 07/30/2021   GLUCOSE 107 (H) 07/30/2021   BUN 14 07/30/2021   CREATININE 1.11 07/30/2021   CALCIUM 10.0 07/30/2021   PROT 6.9 07/30/2021   ALBUMIN 4.1 07/30/2021   AST 16 07/30/2021   ALT 25 07/30/2021   ALKPHOS 108 07/30/2021   BILITOT 0.6 07/30/2021   GFRNONAA >60 07/30/2021    Lab Results  Component Value Date   CEA1 2.2 07/10/2021   IOM355 11 07/10/2021    Lab Results  Component Value Date   INR 1.7 (H) 07/30/2021   LABPROT 19.5 (H) 07/30/2021   Medications: I have reviewed the patient's current medications.   Assessment/Plan: Coagulopathy-persistently elevated PT and PTT in the [facility] labs, positive lupus anticoagulant 2.  Liver lesions -06/30/2021 CT abdomen/pelvis-diffusely heterogeneous liver parenchyma with suggestion of innumerable low-density lesions, small cyst in the pancreas, multiple bilateral renal cysts, enlarged prostate gland with nodular projection into the bladder  base. -06/30/2021 PSA was 0.51 -07/01/2021 MRI of the abdomen-innumerable lesions throughout the liver which appear to be diffusion positive and worrisome for metastatic disease -07/01/2021 AFP was 1.4 -07/10/2021 CT chest-no definite findings to suggest metastatic disease to the lungs, stable 4 mm subpleural nodule favored to be benign 3.  Fever, resolved 4.  Mild normocytic anemia 5.  History of leukocytosis 6.  Enlarged prostate 7.  Pancreatic cyst, renal cysts, splenic cyst 8.  CKD 9.  Hypertension 10.  Dyslipidemia 11.  Protein calorie malnutrition/weight loss 12.  Myeloma panel 07/11/2021-M spike 0.7, IgG monoclonal protein with kappa light chain specificity, immunoglobulins in normal range      Disposition: Shane Lam appears well.  There is no clinical evidence for development of a malignancy or infection.  He will be scheduled for a repeat liver MRI to follow-up on the liver lesions noted on CT and MRI in November.  Shane Lam will return for an office visit after the MRI in February.  He will call for new symptoms.  I suspect the elevated PT and PTT are related to a lupus anticoagulant.  Betsy Coder, MD  09/03/2021  11:16 AM

## 2021-09-04 ENCOUNTER — Encounter: Payer: Self-pay | Admitting: Oncology

## 2021-09-04 DIAGNOSIS — K769 Liver disease, unspecified: Secondary | ICD-10-CM

## 2021-09-10 ENCOUNTER — Other Ambulatory Visit: Payer: Self-pay

## 2021-09-10 ENCOUNTER — Other Ambulatory Visit: Payer: Self-pay | Admitting: *Deleted

## 2021-09-10 ENCOUNTER — Encounter: Payer: Self-pay | Admitting: *Deleted

## 2021-10-06 ENCOUNTER — Ambulatory Visit
Admission: RE | Admit: 2021-10-06 | Discharge: 2021-10-06 | Disposition: A | Discharge status: Home / Self Care | Payer: Medicare Other | Source: Ambulatory Visit | Attending: Oncology | Admitting: Oncology

## 2021-10-06 ENCOUNTER — Other Ambulatory Visit: Payer: Self-pay | Admitting: Internal Medicine

## 2021-10-06 DIAGNOSIS — N281 Cyst of kidney, acquired: Secondary | ICD-10-CM | POA: Diagnosis not present

## 2021-10-06 DIAGNOSIS — R911 Solitary pulmonary nodule: Secondary | ICD-10-CM

## 2021-10-06 DIAGNOSIS — K769 Liver disease, unspecified: Secondary | ICD-10-CM

## 2021-10-06 MED ORDER — GADOBENATE DIMEGLUMINE 529 MG/ML IV SOLN
14.0000 mL | Freq: Once | INTRAVENOUS | Status: AC | PRN
  Administered 2021-10-06: 14 mL via INTRAVENOUS

## 2021-10-07 DIAGNOSIS — H2513 Age-related nuclear cataract, bilateral: Secondary | ICD-10-CM | POA: Diagnosis not present

## 2021-10-07 DIAGNOSIS — H25013 Cortical age-related cataract, bilateral: Secondary | ICD-10-CM | POA: Diagnosis not present

## 2021-10-07 DIAGNOSIS — H18553 Macular corneal dystrophy, bilateral: Secondary | ICD-10-CM | POA: Diagnosis not present

## 2021-10-15 DIAGNOSIS — U071 COVID-19: Secondary | ICD-10-CM | POA: Diagnosis not present

## 2021-10-19 ENCOUNTER — Inpatient Hospital Stay: Payer: Medicare Other | Attending: Oncology | Admitting: Oncology

## 2021-10-19 ENCOUNTER — Inpatient Hospital Stay: Payer: Medicare Other

## 2021-10-19 ENCOUNTER — Other Ambulatory Visit: Payer: Self-pay

## 2021-10-19 VITALS — BP 149/96 | HR 64 | Temp 98.0°F | Resp 19 | Ht 69.0 in | Wt 148.6 lb

## 2021-10-19 DIAGNOSIS — K769 Liver disease, unspecified: Secondary | ICD-10-CM | POA: Insufficient documentation

## 2021-10-19 DIAGNOSIS — N189 Chronic kidney disease, unspecified: Secondary | ICD-10-CM | POA: Diagnosis not present

## 2021-10-19 DIAGNOSIS — D649 Anemia, unspecified: Secondary | ICD-10-CM | POA: Diagnosis not present

## 2021-10-19 DIAGNOSIS — I129 Hypertensive chronic kidney disease with stage 1 through stage 4 chronic kidney disease, or unspecified chronic kidney disease: Secondary | ICD-10-CM | POA: Diagnosis not present

## 2021-10-19 DIAGNOSIS — E46 Unspecified protein-calorie malnutrition: Secondary | ICD-10-CM | POA: Insufficient documentation

## 2021-10-19 DIAGNOSIS — N400 Enlarged prostate without lower urinary tract symptoms: Secondary | ICD-10-CM | POA: Insufficient documentation

## 2021-10-19 DIAGNOSIS — D689 Coagulation defect, unspecified: Secondary | ICD-10-CM

## 2021-10-19 NOTE — Progress Notes (Signed)
°  [place] OFFICE PROGRESS NOTE   Diagnosis: Lupus anticoagulant, liver lesions  INTERVAL HISTORY:   Shane Lam returns as scheduled.  He feels well.  He is exercising.  No fever or bleeding.  Good appetite.  His weight has stabilized.  He is now eating a healthier diet.  Objective:  Vital signs in last 24 hours:  Blood pressure (!) 149/96, pulse 64, temperature 98 F (36.7 C), temperature source Oral, resp. rate 19, height 5\' 9"  (1.753 m), weight 148 lb 9.6 oz (67.4 kg), SpO2 100 %.    Lymphatics: No cervical, supraclavicular, axillary, or inguinal nodes Resp: Lungs clear bilaterally Cardio: Regular rate and rhythm GI: No hepatosplenomegaly Vascular: No leg edema  Lab Results:  Lab Results  Component Value Date   WBC 8.3 07/30/2021   HGB 13.4 07/30/2021   HCT 42.2 07/30/2021   MCV 93.6 07/30/2021   PLT 239 07/30/2021   NEUTROABS 6.3 07/30/2021    CMP  Lab Results  Component Value Date   NA 141 07/30/2021   K 3.6 07/30/2021   CL 104 07/30/2021   CO2 30 07/30/2021   GLUCOSE 107 (H) 07/30/2021   BUN 14 07/30/2021   CREATININE 1.11 07/30/2021   CALCIUM 10.0 07/30/2021   PROT 6.9 07/30/2021   ALBUMIN 4.1 07/30/2021   AST 16 07/30/2021   ALT 25 07/30/2021   ALKPHOS 108 07/30/2021   BILITOT 0.6 07/30/2021   GFRNONAA >60 07/30/2021      Lab Results  Component Value Date   INR 1.7 (H) 07/30/2021   LABPROT 19.5 (H) 07/30/2021     Medications: I have reviewed the patient's current medications.   Assessment/Plan: Coagulopathy-persistently elevated PT and PTT in the [facility] labs, positive lupus anticoagulant 2.  Liver lesions -06/30/2021 CT abdomen/pelvis-diffusely heterogeneous liver parenchyma with suggestion of innumerable low-density lesions, small cyst in the pancreas, multiple bilateral renal cysts, enlarged prostate gland with nodular projection into the bladder base. -06/30/2021 PSA was 0.51 -07/01/2021 MRI of the abdomen-innumerable  lesions throughout the liver which appear to be diffusion positive and worrisome for metastatic disease -07/01/2021 AFP was 1.4 -07/10/2021 CT chest-no definite findings to suggest metastatic disease to the lungs, stable 4 mm subpleural nodule favored to be benign -10/06/2021-MRI abdomen-previously noted nodular areas of restricted diffusion are no longer seen, 7 mm cystic lesion in the head/neck of the pancreas-likely a sidebranch IPMN 3.  Fever, resolved 4.  Mild normocytic anemia 5.  History of leukocytosis 6.  Enlarged prostate 7.  Pancreatic cyst, renal cysts, splenic cyst 8.  CKD 9.  Hypertension 10.  Dyslipidemia 11.  Protein calorie malnutrition/weight loss 12.  Myeloma panel 07/11/2021-M spike 0.7, IgG monoclonal protein with kappa light chain specificity, immunoglobulins in normal range      Disposition: Shane Lam appears well.  I suspect the liver lesions in November were related to the febrile illness.  Liver lesions are no longer apparent on the liver MRI.  He has a cyst in the pancreas head, likely a benign lesion.  I recommend Dr. Shelia Media obtain a follow-up abdomen MRI at a 2-year interval.  Shane Lam has an elevated PT and PTT.  He appears to have a lupus anticoagulant, likely affecting the PT and PTT.  The lupus coagulant should not place him at increased risk for bleeding.  Shane Lam plans to continue clinical follow-up with Dr. Shelia Media.  I am available to see him as needed.  Betsy Coder, MD  10/19/2021  10:54 AM

## 2021-10-26 DIAGNOSIS — I10 Essential (primary) hypertension: Secondary | ICD-10-CM | POA: Diagnosis not present

## 2021-10-26 DIAGNOSIS — E7800 Pure hypercholesterolemia, unspecified: Secondary | ICD-10-CM | POA: Diagnosis not present

## 2021-10-26 DIAGNOSIS — E269 Hyperaldosteronism, unspecified: Secondary | ICD-10-CM | POA: Diagnosis not present

## 2021-10-26 DIAGNOSIS — Z125 Encounter for screening for malignant neoplasm of prostate: Secondary | ICD-10-CM | POA: Diagnosis not present

## 2021-10-29 ENCOUNTER — Telehealth: Payer: Self-pay

## 2021-10-29 DIAGNOSIS — D6862 Lupus anticoagulant syndrome: Secondary | ICD-10-CM | POA: Diagnosis not present

## 2021-10-29 DIAGNOSIS — Z2821 Immunization not carried out because of patient refusal: Secondary | ICD-10-CM | POA: Diagnosis not present

## 2021-10-29 DIAGNOSIS — Z0000 Encounter for general adult medical examination without abnormal findings: Secondary | ICD-10-CM | POA: Diagnosis not present

## 2021-10-29 DIAGNOSIS — R911 Solitary pulmonary nodule: Secondary | ICD-10-CM | POA: Diagnosis not present

## 2021-10-29 DIAGNOSIS — I2510 Atherosclerotic heart disease of native coronary artery without angina pectoris: Secondary | ICD-10-CM | POA: Diagnosis not present

## 2021-10-29 DIAGNOSIS — I700 Atherosclerosis of aorta: Secondary | ICD-10-CM | POA: Diagnosis not present

## 2021-10-29 DIAGNOSIS — R7989 Other specified abnormal findings of blood chemistry: Secondary | ICD-10-CM | POA: Diagnosis not present

## 2021-10-29 DIAGNOSIS — D472 Monoclonal gammopathy: Secondary | ICD-10-CM | POA: Diagnosis not present

## 2021-10-29 DIAGNOSIS — N402 Nodular prostate without lower urinary tract symptoms: Secondary | ICD-10-CM | POA: Diagnosis not present

## 2021-10-29 DIAGNOSIS — I10 Essential (primary) hypertension: Secondary | ICD-10-CM | POA: Diagnosis not present

## 2021-10-29 DIAGNOSIS — E269 Hyperaldosteronism, unspecified: Secondary | ICD-10-CM | POA: Diagnosis not present

## 2021-10-29 DIAGNOSIS — K862 Cyst of pancreas: Secondary | ICD-10-CM | POA: Diagnosis not present

## 2021-10-29 NOTE — Telephone Encounter (Signed)
TC from Pt's wife requesting to have notes revised from a CT scan done on 07/10/21 during his hospital stay,that states Pt has a history of Cancer informed Pt's wife she would have to get in contact with the hospital to get this comment changed. This CT scan was not ordered by Dr Benay Spice. Pt stated she will get in contact with the hospital.  ?

## 2021-11-05 ENCOUNTER — Ambulatory Visit
Admission: RE | Admit: 2021-11-05 | Discharge: 2021-11-05 | Disposition: A | Discharge status: Home / Self Care | Payer: Medicare Other | Source: Ambulatory Visit | Attending: Internal Medicine | Admitting: Internal Medicine

## 2021-11-05 ENCOUNTER — Other Ambulatory Visit: Payer: Self-pay

## 2021-11-05 DIAGNOSIS — R911 Solitary pulmonary nodule: Secondary | ICD-10-CM

## 2021-11-05 DIAGNOSIS — I700 Atherosclerosis of aorta: Secondary | ICD-10-CM | POA: Diagnosis not present

## 2021-12-02 ENCOUNTER — Other Ambulatory Visit: Payer: Self-pay

## 2021-12-02 ENCOUNTER — Telehealth: Payer: Self-pay | Admitting: Internal Medicine

## 2021-12-02 DIAGNOSIS — E785 Hyperlipidemia, unspecified: Secondary | ICD-10-CM

## 2021-12-02 NOTE — Telephone Encounter (Signed)
Pts wife is wanting to know when an order will be placed for labs. Pt has changed eating habits and wife would like to know if this has helped his cholesterol... please advise  ?

## 2021-12-02 NOTE — Telephone Encounter (Signed)
Called pt to let him know he is to have labs drawn 4-5 months after the last office visit. Orders placed for Lipids will place order in outgoing mail. ?

## 2021-12-10 DIAGNOSIS — E785 Hyperlipidemia, unspecified: Secondary | ICD-10-CM | POA: Diagnosis not present

## 2021-12-10 LAB — LIPID PANEL
Chol/HDL Ratio: 3.7 ratio (ref 0.0–5.0)
Cholesterol, Total: 170 mg/dL (ref 100–199)
HDL: 46 mg/dL (ref >39)
LDL Chol Calc (NIH): 114 mg/dL — ABNORMAL HIGH (ref 0–99)
Triglycerides: 47 mg/dL (ref 0–149)
VLDL Cholesterol Cal: 10 mg/dL (ref 5–40)

## 2021-12-14 ENCOUNTER — Other Ambulatory Visit: Payer: Self-pay

## 2021-12-14 ENCOUNTER — Ambulatory Visit (INDEPENDENT_AMBULATORY_CARE_PROVIDER_SITE_OTHER): Payer: Medicare Other | Admitting: Internal Medicine

## 2021-12-14 ENCOUNTER — Encounter: Payer: Self-pay | Admitting: Internal Medicine

## 2021-12-14 VITALS — BP 142/96 | HR 68 | Ht 68.05 in | Wt 148.8 lb

## 2021-12-14 DIAGNOSIS — E269 Hyperaldosteronism, unspecified: Secondary | ICD-10-CM

## 2021-12-14 DIAGNOSIS — E785 Hyperlipidemia, unspecified: Secondary | ICD-10-CM | POA: Diagnosis not present

## 2021-12-14 DIAGNOSIS — I10 Essential (primary) hypertension: Secondary | ICD-10-CM

## 2021-12-14 DIAGNOSIS — R931 Abnormal findings on diagnostic imaging of heart and coronary circulation: Secondary | ICD-10-CM | POA: Diagnosis not present

## 2021-12-14 MED ORDER — FELODIPINE ER 10 MG PO TB24: 10.0000 mg | ORAL_TABLET | Freq: Every day | ORAL | 3 refills | Status: DC

## 2021-12-14 MED ORDER — ROSUVASTATIN CALCIUM 5 MG PO TABS: 5.0000 mg | ORAL_TABLET | Freq: Every day | ORAL | 3 refills | Status: DC

## 2021-12-14 NOTE — Progress Notes (Signed)
? ?OFFICE CONSULT NOTE ? ?Chief Complaint:  ?Follow-up ? ?Primary Care Physician: ?[name], MD ? ?HPI:  ?Shane Lam is a 71 y.o. male who is being seen today for the evaluation of hypertension and dyslipidemia at the request of [name], MD. this is a pleasant 71 year old male kindly referred for evaluation management of hypertension and dyslipidemia.  Recently he has been having some difficulty with hypertension management.  He carries a diagnosis of Conn syndrome.  He was previously managed by endocrinologist and Dr. Lambert Mody practice and has been on 50 mg spironolactone but apparently this caused issues with his renal function.  It also been having issues with hypokalemia related to hyperaldosteronism, requiring potassium supplementation.  He was on a mixed diuretic with triamterene/HCTZ however that did not seem to help his hypokalemia enough.  Blood pressure diary was reviewed by myself and indicates blood pressures generally between 737-106 systolic.  Another issue reportedly was with bradycardia, precluding any beta-blocker or nondihydropyridine calcium channel blocker.  Shane Lam has had a lot of recent imaging including ultrasound which indicated fatty liver however his liver enzymes recently have been normal.  Calcium score was performed in March 2022 which showed a total of 114, 65th percentile for age and sex matched controls.  There was also a 4 mm nodule along the right minor fissure which will need follow-up.  The aorta measured 3.9 cm in the a sending portion, this should be followed up as well in a year. ? ?12/14/2021 ? ?Shane Lam returns today for follow-up.  Blood pressure appears to be somewhat elevated.  Initially 142/96 but repeat was 138/96.  According to his wife he was apparently taken off of 10 mg of felodipine during his last hospitalization and is currently only on 5 mg.  Otherwise as mentioned above he was noted to have multivessel coronary calcium and despite aggressive  diet and exercise and being normal weight, his cholesterol remains elevated.  LDL in November was 121.  Recent repeat lipids show total cholesterol 170, HDL 46, triglycerides 47 and LDL of 114.  His goal LDL is less than 70. ? ?PMHx:  ?Past Medical History:  ?Diagnosis Date  ? Allergic rhinitis due to pollen   ? Essential hypertension, malignant   ? Nonspecific abnormal electrocardiogram (ECG) (EKG)   ? Pure hypercholesterolemia   ? ? ?FAMHx:  ?Family History  ?Problem Relation Age of Onset  ? CAD Mother   ? Brain cancer Mother   ? CAD Father   ? Pancreatic cancer Father   ? Lung cancer Maternal Aunt   ? ? ?SOCHx:  ? reports that he has never smoked. He has never used smokeless tobacco. He reports that he does not drink alcohol and does not use drugs. ? ?ALLERGIES:  ?Allergies  ?Allergen Reactions  ? Phenergan [Promethazine] Other (See Comments)  ?  Makes Pt delirious  ? ? ?ROS: ?Pertinent items noted in HPI and remainder of comprehensive ROS otherwise negative. ? ?HOME MEDS: ?Current Outpatient Medications on File Prior to Visit  ?Medication Sig Dispense Refill  ? Cholecalciferol (VITAMIN D3) 250 MCG (10000 UT) capsule Take 10,000 Units by mouth once a week.    ? felodipine (PLENDIL) 5 MG 24 hr tablet Take 5 mg by mouth daily.    ? Multiple Vitamin (MULTIVITAMIN) tablet Take 1 tablet by mouth daily.      ? potassium chloride SA (KLOR-CON M) 20 MEQ tablet Take 20 mEq by mouth daily.    ? spironolactone (ALDACTONE) 25 MG  tablet Take 25 mg by mouth daily.    ? tadalafil (CIALIS) 10 MG tablet 1 tablet    ? telmisartan (MICARDIS) 80 MG tablet Take 80 mg by mouth daily.    ? ?No current facility-administered medications on file prior to visit.  ? ? ?LABS/IMAGING: ?No results found for this or any previous visit (from the past 48 hour(s)). ?No results found. ? ?LIPID PANEL: ?   ?Component Value Date/Time  ? CHOL 170 12/10/2021 1204  ? TRIG 47 12/10/2021 1204  ? HDL 46 12/10/2021 1204  ? CHOLHDL 3.7 12/10/2021 1204  ?  CHOLHDL 5.6 07/05/2021 0718  ? VLDL 15 07/05/2021 0718  ? LDLCALC 114 (H) 12/10/2021 1204  ? ? ?WEIGHTS: ?Wt Readings from Last 3 Encounters:  ?12/14/21 148 lb 12.8 oz (67.5 kg)  ?10/19/21 148 lb 9.6 oz (67.4 kg)  ?09/03/21 151 lb (68.5 kg)  ? ? ?VITALS: ?BP (!) 142/96   Pulse 68   Ht 5' 8.05" (1.728 m)   Wt 148 lb 12.8 oz (67.5 kg)   SpO2 99%   BMI 22.59 kg/m?  ? ?EXAM: ?General appearance: alert and no distress ?Neck: no carotid bruit, no JVD, and thyroid not enlarged, symmetric, no tenderness/mass/nodules ?Lungs: clear to auscultation bilaterally ?Heart: regular rate and rhythm, S1, S2 normal, no murmur, click, rub or gallop ?Abdomen: soft, non-tender; bowel sounds normal; no masses,  no organomegaly ?Extremities: extremities normal, atraumatic, no cyanosis or edema ?Pulses: 2+ and symmetric ?Skin: Skin color, texture, turgor normal. No rashes or lesions ?Neurologic: Grossly normal ?Psych: Pleasant ? ?EKG: ?N normal sinus rhythm at 68- personally reviewed ? ?ASSESSMENT: ?Mixed dyslipidemia, goal LDL <70 ?Conn syndrome/hyperaldosteronism ?Dilated ascending aorta to 39 mm (repeat CT studies do not indicated this) ?Elevated CAC score of 114, 65th percentile for age and sex matched control ?Solitary pulmonary nodule -considered benign by serial CT ? ?PLAN: ?1.   Shane Lam continues to have elevated cholesterol despite exercise, diet and lifestyle modifications, which remains well above his target LDL less than 70.  I would advise statin therapy and after much discussion he does seem agreeable to it.  We will start rosuvastatin 5 mg daily.  Because of some abnormalities on liver imaging in the fall, he is wife felt more comfortable with repeating liver enzymes (although they have been normal) after starting the statin and will get a c-Met in about 2 weeks.  This will also allow [place] to repeat potassium since he has a history of hypokalemia on supplementation.  Finally he has had multiple recurrent CTs which show  a stable pulmonary nodule which is considered likely benign.  The aorta has not been mentioned to be dilated therefore that may have been 1 abnormal measurement.  We will repeat a lipid profile in about 3 to 4 months and follow-up at that time ? ?Pixie Casino, MD, [facility], FACP  ?[place]  ?Medical Director of the Advanced Lipid Disorders &  ?Card[facility] ?Diplomate of the [employer] of Clinical Lipidology ?Attending Cardiologist  ?Direct Dial: [phone]  Fax: [phone]  ?Website:  [email or web address] ? ?Nadean Corwin Caidance Sybert ?12/14/2021, 10:11 AM ? ?

## 2021-12-14 NOTE — Patient Instructions (Signed)
Medication Instructions:  ?INCREASE felodipine to '10mg'$  daily -- blood pressure ?START crestor '5mg'$  daily -- cholesterol  ?CONTINUE all other current medications ? ?*If you need a refill on your cardiac medications before your next appointment, please call your pharmacy* ? ? ?Lab Work: ?NON-FASTING CMET week of May 1st ? ?FASTING lipid panel in about 3-4 months  ? ?If you have labs (blood work) drawn today and your tests are completely normal, you will receive your results only by: ?MyChart Message (if you have MyChart) OR ?A paper copy in the mail ?If you have any lab test that is abnormal or we need to change your treatment, we will call you to review the results. ? ? ?Testing/Procedures: ?NONE ? ? ?Follow-Up: ?At [facility], you and your health needs are our priority.  As part of our continuing mission to provide you with exceptional heart care, we have created designated Provider Care Teams.  These Care Teams include your primary Cardiologist (physician) and Advanced Practice Providers (APPs -  Physician Assistants and Nurse Practitioners) who all work together to provide you with the care you need, when you need it. ? ?We recommend signing up for the patient portal called "MyChart".  Sign up information is provided on this After Visit Summary.  MyChart is used to connect with patients for Virtual Visits (Telemedicine).  Patients are able to view lab/test results, encounter notes, upcoming appointments, etc.  Non-urgent messages can be sent to your provider as well.   ?To learn more about what you can do with MyChart, go to [email or web address].   ? ?Your next appointment:   ?4 month(s) ? ?The format for your next appointment:   ?In Person ? ?Provider:   ?Pixie Casino, MD ?** DOD acute spot OK to use { ? ? ?Important Information About Sugar ? ? ? ? ? ? ?

## 2021-12-30 DIAGNOSIS — I10 Essential (primary) hypertension: Secondary | ICD-10-CM | POA: Diagnosis not present

## 2021-12-30 DIAGNOSIS — E785 Hyperlipidemia, unspecified: Secondary | ICD-10-CM | POA: Diagnosis not present

## 2021-12-31 ENCOUNTER — Telehealth: Payer: Self-pay | Admitting: Internal Medicine

## 2021-12-31 LAB — COMPREHENSIVE METABOLIC PANEL
ALT: 34 IU/L (ref 0–44)
AST: 26 IU/L (ref 0–40)
Albumin/Globulin Ratio: 1.7 (ref 1.2–2.2)
Albumin: 4.2 g/dL (ref 3.7–4.7)
Alkaline Phosphatase: 181 IU/L — ABNORMAL HIGH (ref 44–121)
BUN/Creatinine Ratio: 18 (ref 10–24)
BUN: 23 mg/dL (ref 8–27)
Bilirubin Total: 0.3 mg/dL (ref 0.0–1.2)
CO2: 28 mmol/L (ref 20–29)
Calcium: 9.5 mg/dL (ref 8.6–10.2)
Chloride: 104 mmol/L (ref 96–106)
Creatinine, Ser: 1.3 mg/dL — ABNORMAL HIGH (ref 0.76–1.27)
Globulin, Total: 2.5 g/dL (ref 1.5–4.5)
Glucose: 89 mg/dL (ref 70–99)
Potassium: 4.3 mmol/L (ref 3.5–5.2)
Sodium: 145 mmol/L — ABNORMAL HIGH (ref 134–144)
Total Protein: 6.7 g/dL (ref 6.0–8.5)
eGFR: 58 mL/min/{1.73_m2} — ABNORMAL LOW (ref >59)

## 2021-12-31 LAB — LIPID PANEL
Chol/HDL Ratio: 2.8 ratio (ref 0.0–5.0)
Cholesterol, Total: 121 mg/dL (ref 100–199)
HDL: 44 mg/dL (ref >39)
LDL Chol Calc (NIH): 67 mg/dL (ref 0–99)
Triglycerides: 42 mg/dL (ref 0–149)
VLDL Cholesterol Cal: 10 mg/dL (ref 5–40)

## 2021-12-31 NOTE — Telephone Encounter (Signed)
Called pt's wife. Went over Dr. Lysbeth Penner message on [place] from yesterday. She verbalized understanding but was still concerned before her husband's alkaline phosphatase is increased and his LDL decreased so much after just being on the medication for 2 weeks. Will get message to  Dr. Debara Pickett for review. No further questions expressed at this time. ?

## 2021-12-31 NOTE — Telephone Encounter (Signed)
Called pt's wife. Went over Dr. Lysbeth Penner message. She verbalized understanding and will follow up with pt's PCP to monitor.  ?

## 2021-12-31 NOTE — Telephone Encounter (Signed)
Cholesterol looks great - I would be happy by how much it has come down. It cannot get too low.  The alkaline phosphatase elevation by itself is not too concerning - this could be related to the labs that suggest some dehydration, mildly increased creatinine and abnormal sodium. Not related to the statin medicine. Would advise follow-up with his PCP about this. ? ?Dr. Debara Pickett ?

## 2021-12-31 NOTE — Telephone Encounter (Signed)
Patient's wife states the patient has been on Crestor for 2 and a half weeks and on his lab results his alkaline phosphatase is high now. Please advise.  ?

## 2022-01-20 DIAGNOSIS — H2513 Age-related nuclear cataract, bilateral: Secondary | ICD-10-CM | POA: Diagnosis not present

## 2022-01-20 DIAGNOSIS — H35033 Hypertensive retinopathy, bilateral: Secondary | ICD-10-CM | POA: Diagnosis not present

## 2022-01-20 DIAGNOSIS — H52213 Irregular astigmatism, bilateral: Secondary | ICD-10-CM | POA: Diagnosis not present

## 2022-01-20 DIAGNOSIS — H18553 Macular corneal dystrophy, bilateral: Secondary | ICD-10-CM | POA: Diagnosis not present

## 2022-01-20 DIAGNOSIS — H179 Unspecified corneal scar and opacity: Secondary | ICD-10-CM | POA: Diagnosis not present

## 2022-01-20 DIAGNOSIS — H2511 Age-related nuclear cataract, right eye: Secondary | ICD-10-CM | POA: Diagnosis not present

## 2022-01-20 DIAGNOSIS — H25013 Cortical age-related cataract, bilateral: Secondary | ICD-10-CM | POA: Diagnosis not present

## 2022-01-27 DIAGNOSIS — I10 Essential (primary) hypertension: Secondary | ICD-10-CM | POA: Diagnosis not present

## 2022-01-27 DIAGNOSIS — Z6823 Body mass index (BMI) 23.0-23.9, adult: Secondary | ICD-10-CM | POA: Diagnosis not present

## 2022-01-27 DIAGNOSIS — R634 Abnormal weight loss: Secondary | ICD-10-CM | POA: Diagnosis not present

## 2022-01-27 DIAGNOSIS — E269 Hyperaldosteronism, unspecified: Secondary | ICD-10-CM | POA: Diagnosis not present

## 2022-01-27 DIAGNOSIS — N281 Cyst of kidney, acquired: Secondary | ICD-10-CM | POA: Diagnosis not present

## 2022-01-27 DIAGNOSIS — E876 Hypokalemia: Secondary | ICD-10-CM | POA: Diagnosis not present

## 2022-02-02 DIAGNOSIS — H25811 Combined forms of age-related cataract, right eye: Secondary | ICD-10-CM | POA: Diagnosis not present

## 2022-02-02 DIAGNOSIS — H2511 Age-related nuclear cataract, right eye: Secondary | ICD-10-CM | POA: Diagnosis not present

## 2022-02-18 DIAGNOSIS — Z961 Presence of intraocular lens: Secondary | ICD-10-CM | POA: Diagnosis not present

## 2022-02-18 DIAGNOSIS — H2512 Age-related nuclear cataract, left eye: Secondary | ICD-10-CM | POA: Diagnosis not present

## 2022-02-18 DIAGNOSIS — H25012 Cortical age-related cataract, left eye: Secondary | ICD-10-CM | POA: Diagnosis not present

## 2022-02-23 DIAGNOSIS — H2512 Age-related nuclear cataract, left eye: Secondary | ICD-10-CM | POA: Diagnosis not present

## 2022-02-23 DIAGNOSIS — H25812 Combined forms of age-related cataract, left eye: Secondary | ICD-10-CM | POA: Diagnosis not present

## 2022-02-23 DIAGNOSIS — H25012 Cortical age-related cataract, left eye: Secondary | ICD-10-CM | POA: Diagnosis not present

## 2022-02-26 DIAGNOSIS — I10 Essential (primary) hypertension: Secondary | ICD-10-CM | POA: Diagnosis not present

## 2022-02-26 DIAGNOSIS — E7800 Pure hypercholesterolemia, unspecified: Secondary | ICD-10-CM | POA: Diagnosis not present

## 2022-03-05 DIAGNOSIS — T1502XA Foreign body in cornea, left eye, initial encounter: Secondary | ICD-10-CM | POA: Diagnosis not present

## 2022-03-15 ENCOUNTER — Other Ambulatory Visit: Payer: Self-pay | Admitting: Internal Medicine

## 2022-03-15 DIAGNOSIS — E785 Hyperlipidemia, unspecified: Secondary | ICD-10-CM | POA: Diagnosis not present

## 2022-03-16 LAB — LIPID PANEL
Chol/HDL Ratio: 2.6 ratio (ref 0.0–5.0)
Cholesterol, Total: 124 mg/dL (ref 100–199)
HDL: 48 mg/dL (ref >39)
LDL Chol Calc (NIH): 64 mg/dL (ref 0–99)
Triglycerides: 51 mg/dL (ref 0–149)
VLDL Cholesterol Cal: 12 mg/dL (ref 5–40)

## 2022-04-14 ENCOUNTER — Encounter: Payer: Self-pay | Admitting: Internal Medicine

## 2022-04-14 ENCOUNTER — Ambulatory Visit (INDEPENDENT_AMBULATORY_CARE_PROVIDER_SITE_OTHER): Payer: Medicare Other | Admitting: Internal Medicine

## 2022-04-14 VITALS — BP 120/82 | HR 57 | Ht 68.0 in | Wt 152.0 lb

## 2022-04-14 DIAGNOSIS — R931 Abnormal findings on diagnostic imaging of heart and coronary circulation: Secondary | ICD-10-CM | POA: Diagnosis not present

## 2022-04-14 DIAGNOSIS — E269 Hyperaldosteronism, unspecified: Secondary | ICD-10-CM | POA: Diagnosis not present

## 2022-04-14 DIAGNOSIS — I10 Essential (primary) hypertension: Secondary | ICD-10-CM | POA: Diagnosis not present

## 2022-04-14 DIAGNOSIS — E785 Hyperlipidemia, unspecified: Secondary | ICD-10-CM

## 2022-04-14 NOTE — Progress Notes (Signed)
OFFICE CONSULT NOTE  Chief Complaint:  Follow-up dyslipidemia  Primary Care Physician: [name], MD  HPI:  Shane Lam is a 71 y.o. male who is being seen today for the evaluation of hypertension and dyslipidemia at the request of [name], MD. this is a pleasant 71 year old male kindly referred for evaluation management of hypertension and dyslipidemia.  Recently he has been having some difficulty with hypertension management.  He carries a diagnosis of Conn syndrome.  He was previously managed by endocrinologist and Dr. Lambert Lam practice and has been on 50 mg spironolactone but apparently this caused issues with his renal function.  It also been having issues with hypokalemia related to hyperaldosteronism, requiring potassium supplementation.  He was on a mixed diuretic with triamterene/HCTZ however that did not seem to help his hypokalemia enough.  Blood pressure diary was reviewed by myself and indicates blood pressures generally between 130-865 systolic.  Another issue reportedly was with bradycardia, precluding any beta-blocker or nondihydropyridine calcium channel blocker.  Shane Lam has had a lot of recent imaging including ultrasound which indicated fatty liver however his liver enzymes recently have been normal.  Calcium score was performed in March 2022 which showed a total of 114, 65th percentile for age and sex matched controls.  There was also a 4 mm nodule along the right minor fissure which will need follow-up.  The aorta measured 3.9 cm in the a sending portion, this should be followed up as well in a year.  12/14/2021  Shane Lam returns today for follow-up.  Blood pressure appears to be somewhat elevated.  Initially 142/96 but repeat was 138/96.  According to his wife he was apparently taken off of 10 mg of felodipine during his last hospitalization and is currently only on 5 mg.  Otherwise as mentioned above he was noted to have multivessel coronary calcium and  despite aggressive diet and exercise and being normal weight, his cholesterol remains elevated.  LDL in November was 121.  Recent repeat lipids show total cholesterol 170, HDL 46, triglycerides 47 and LDL of 114.  His goal LDL is less than 70.  04/14/2022  Shane Lam is seen today for follow-up of dyslipidemia.  He has had a nice response to statin therapy on low-dose rosuvastatin.  Total cholesterol now 124, triglycerides 51, HDL 48 and LDL 64.  He denies any side effects with the medication.  Blood pressure is also well controlled today at 120/82.  He was noted to have a recently mildly elevated creatinine at 1.3.  He has not had follow-up labs although this may be related to spironolactone.  He was diagnosed with hyperaldosteronism.  Initially he was on 50 mg of Aldactone but was cut back to 25 mg.  He apparently has follow-up with his PCP next month.  PMHx:  Past Medical History:  Diagnosis Date   Allergic rhinitis due to pollen    Essential hypertension, malignant    Nonspecific abnormal electrocardiogram (ECG) (EKG)    Pure hypercholesterolemia     FAMHx:  Family History  Problem Relation Age of Onset   CAD Mother    Brain cancer Mother    CAD Father    Pancreatic cancer Father    Lung cancer Maternal Aunt     SOCHx:   reports that he has never smoked. He has never used smokeless tobacco. He reports that he does not drink alcohol and does not use drugs.  ALLERGIES:  Allergies  Allergen Reactions   Phenergan [Promethazine] Other (See  Comments)    Makes Pt delirious    ROS: Pertinent items noted in HPI and remainder of comprehensive ROS otherwise negative.  HOME MEDS: Current Outpatient Medications on File Prior to Visit  Medication Sig Dispense Refill   Cholecalciferol (VITAMIN D3) 250 MCG (10000 UT) capsule Take 10,000 Units by mouth once a week.     felodipine (PLENDIL) 10 MG 24 hr tablet Take 1 tablet (10 mg total) by mouth daily. 90 tablet 3   Multiple Vitamin  (MULTIVITAMIN) tablet Take 1 tablet by mouth daily.       potassium chloride SA (KLOR-CON M) 20 MEQ tablet Take 20 mEq by mouth daily.     rosuvastatin (CRESTOR) 5 MG tablet Take 1 tablet (5 mg total) by mouth daily. 90 tablet 3   spironolactone (ALDACTONE) 25 MG tablet Take 25 mg by mouth daily.     tadalafil (CIALIS) 10 MG tablet 1 tablet     telmisartan (MICARDIS) 80 MG tablet Take 80 mg by mouth daily.     No current facility-administered medications on file prior to visit.    LABS/IMAGING: No results found for this or any previous visit (from the past 48 hour(s)). No results found.  LIPID PANEL:    Component Value Date/Time   CHOL 124 03/15/2022 0853   TRIG 51 03/15/2022 0853   HDL 48 03/15/2022 0853   CHOLHDL 2.6 03/15/2022 0853   CHOLHDL 5.6 07/05/2021 0718   VLDL 15 07/05/2021 0718   LDLCALC 64 03/15/2022 0853    WEIGHTS: Wt Readings from Last 3 Encounters:  04/14/22 152 lb (68.9 kg)  12/14/21 148 lb 12.8 oz (67.5 kg)  10/19/21 148 lb 9.6 oz (67.4 kg)    VITALS: BP 120/82   Pulse (!) 57   Ht '5\' 8"'$  (1.727 m)   Wt 152 lb (68.9 kg)   SpO2 99%   BMI 23.11 kg/m   EXAM: Deferred  EKG: Sinus bradycardia with PAC at 57-personally reviewed  ASSESSMENT: Mixed dyslipidemia, goal LDL <70 Conn syndrome/hyperaldosteronism Dilated ascending aorta to 39 mm (repeat CT studies do not indicated this) Elevated CAC score of 114, 65th percentile for age and sex matched control Solitary pulmonary nodule -considered benign by serial CT  PLAN: 1.   Shane Lam has had marked improvement in his dyslipidemia with LDL now at target.  We will continue low-dose rosuvastatin.  He should follow-up with his PCP for recheck of his elevated creatinine which may have been related to higher dose Aldactone.  Plan follow-up with me in 6 months with repeat lipids or sooner as necessary.  Pixie Casino, MD, [facility], [place] Director of the Advanced Lipid  Disorders &  Cardiovascular Risk Reduction Clinic Diplomate of the American Board of Clinical Lipidology Attending Cardiologist  Direct Dial: [phone]  Fax: [phone]  Website:  [email or web address] 04/14/2022, 3:13 PM

## 2022-04-14 NOTE — Patient Instructions (Signed)
Medication Instructions:  NO CHANGES   *If you need a refill on your cardiac medications before your next appointment, please call your pharmacy*   Lab Work: FASTING lab work in about 6 months -- before next appointment   If you have labs (blood work) drawn today and your tests are completely normal, you will receive your results only by: [place] (if you have MyChart) OR A paper copy in the mail If you have any lab test that is abnormal or we need to change your treatment, we will call you to review the results.  Follow-Up: At [facility], you and your health needs are our priority.  As part of our continuing mission to provide you with exceptional heart care, we have created designated Provider Care Teams.  These Care Teams include your primary Cardiologist (physician) and Advanced Practice Providers (APPs -  Physician Assistants and Nurse Practitioners) who all work together to provide you with the care you need, when you need it.  We recommend signing up for the patient portal called "MyChart".  Sign up information is provided on this After Visit Summary.  MyChart is used to connect with patients for Virtual Visits (Telemedicine).  Patients are able to view lab/test results, encounter notes, upcoming appointments, etc.  Non-urgent messages can be sent to your provider as well.   To learn more about what you can do with MyChart, go to [email or web address].    Your next appointment:   6 month(s)  The format for your next appointment:   In Person  Provider:   Pixie Casino, MD {

## 2022-05-05 DIAGNOSIS — I10 Essential (primary) hypertension: Secondary | ICD-10-CM | POA: Diagnosis not present

## 2022-05-05 DIAGNOSIS — K862 Cyst of pancreas: Secondary | ICD-10-CM | POA: Diagnosis not present

## 2022-05-05 DIAGNOSIS — R911 Solitary pulmonary nodule: Secondary | ICD-10-CM | POA: Diagnosis not present

## 2022-05-05 DIAGNOSIS — N429 Disorder of prostate, unspecified: Secondary | ICD-10-CM | POA: Diagnosis not present

## 2022-05-08 IMAGING — CT CT CHEST W/O CM
1 of 2 series · 15 of 31 positions shown, 19 images · non-contrast
Comparison: Chest CT 07/10/2021.  Cardiac CT 11/12/2020

CLINICAL DATA: Follow-up pulmonary nodule.



[Series 6: super d · axial · 0.71mm/px · z∈[-156,+150]mm · 15 of 429 slices shown, 19 images]
[im 23/429  mediastinal]
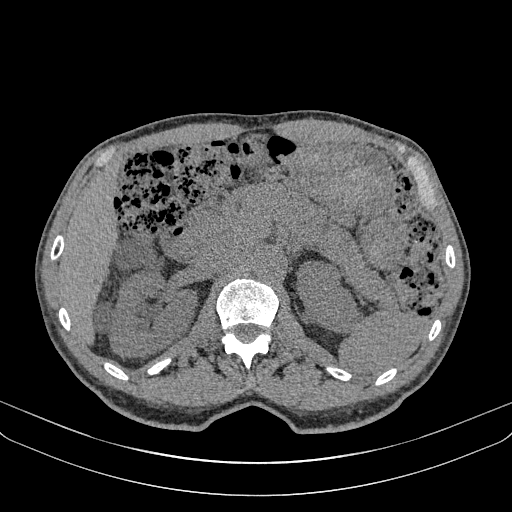
[im 23/429  lung]
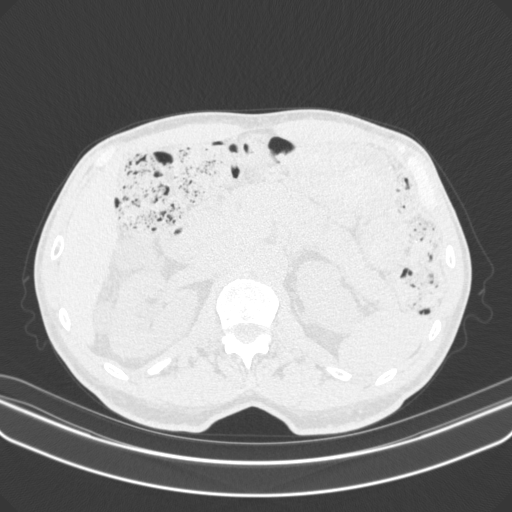
[im 68/429  lung]
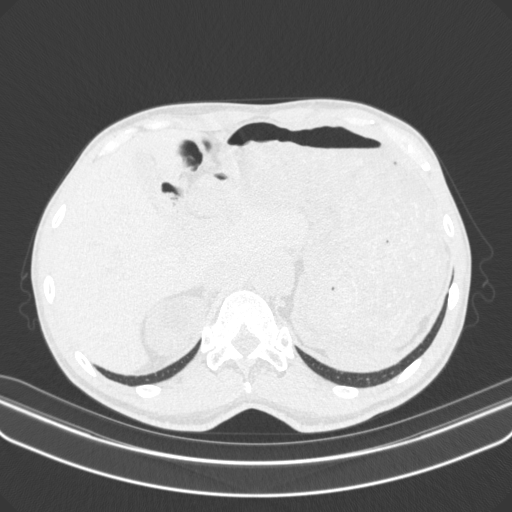
[im 91/429  lung]
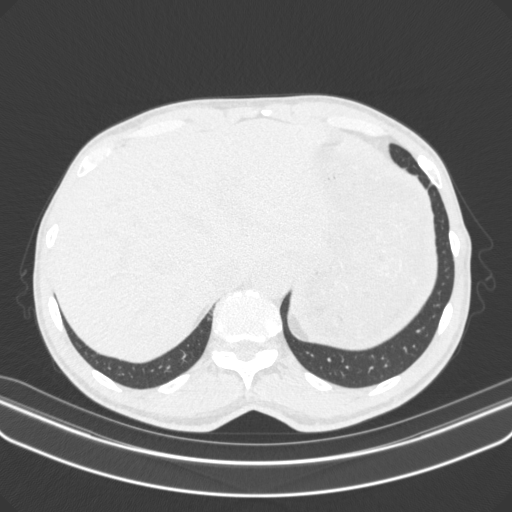
[im 113/429  lung]
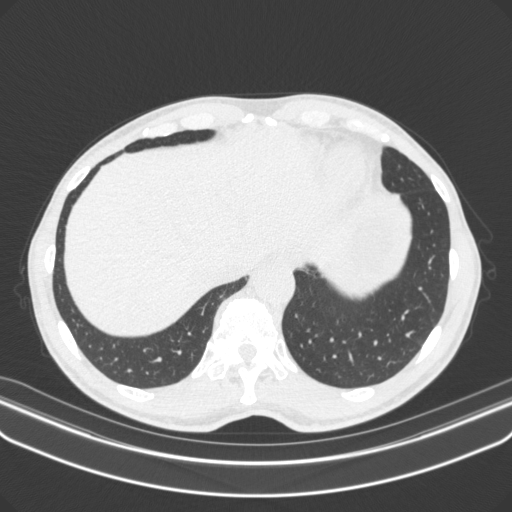
[im 143/429  mediastinal]
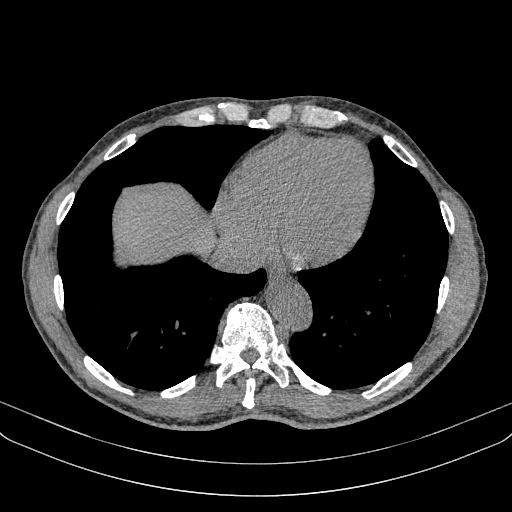
[im 143/429  lung]
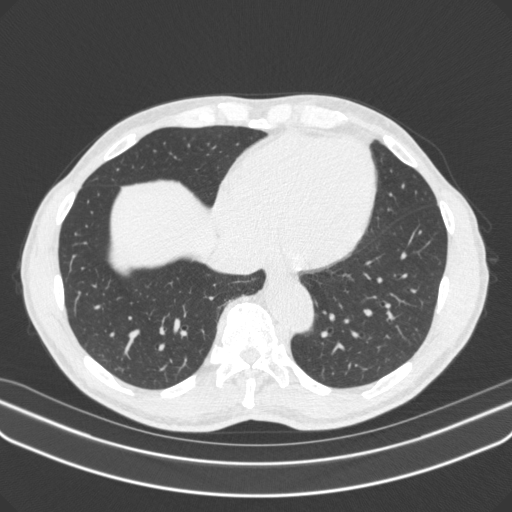
[im 158/429  lung]
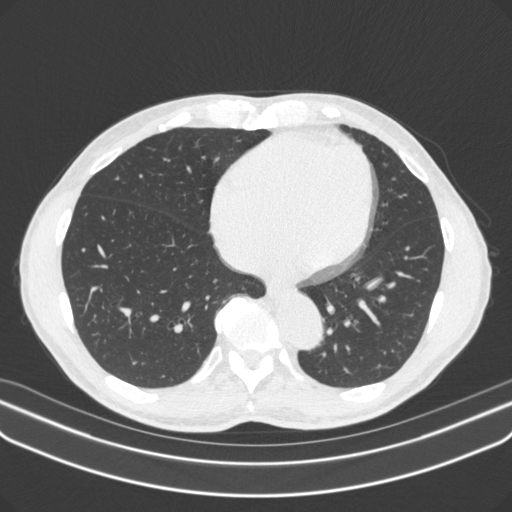
[im 181/429  lung]
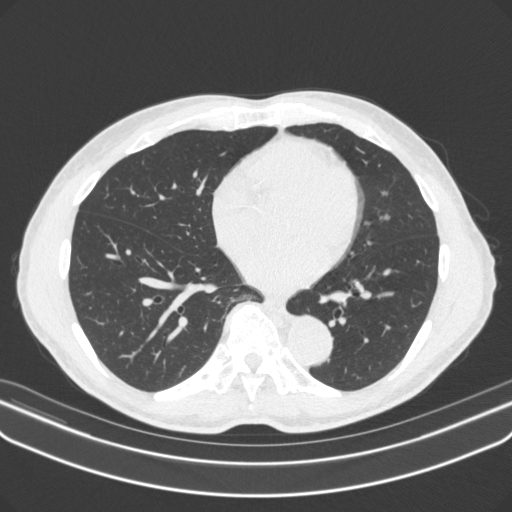
[im 226/429  lung]
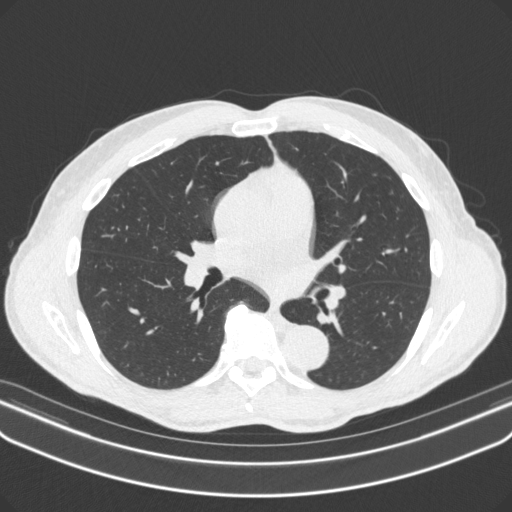
[im 248/429  mediastinal]
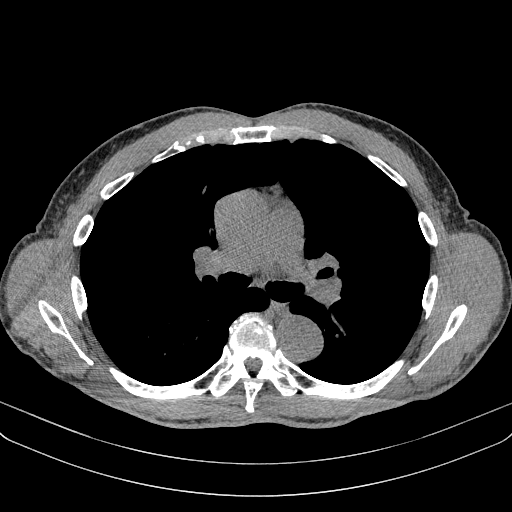
[im 248/429  lung]
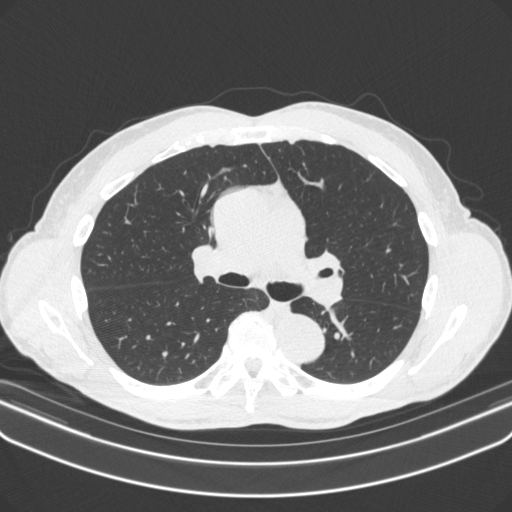
[im 271/429  lung]
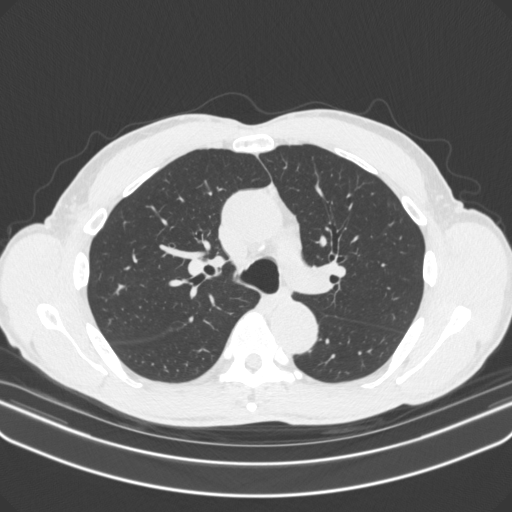
[im 293/429  lung]
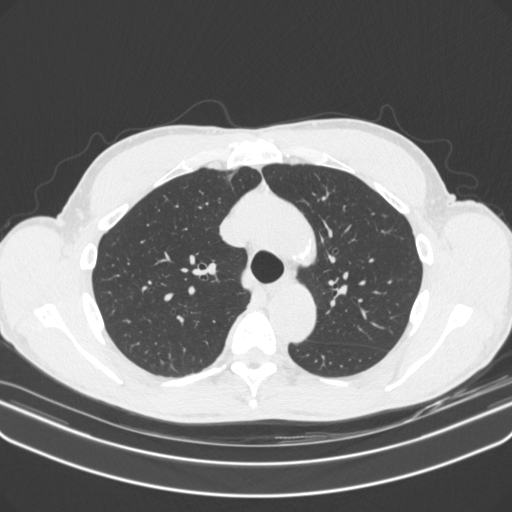
[im 316/429  lung]
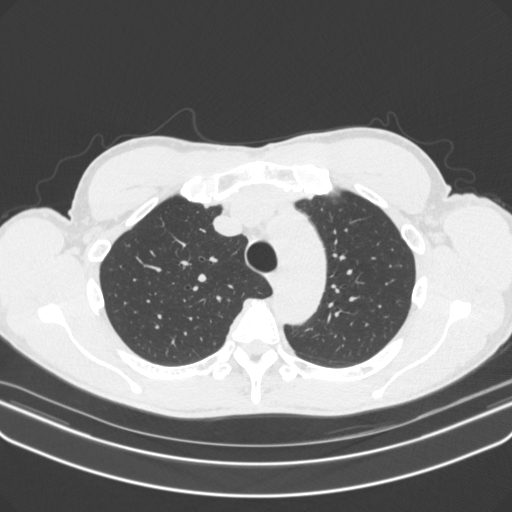
[im 338/429  mediastinal]
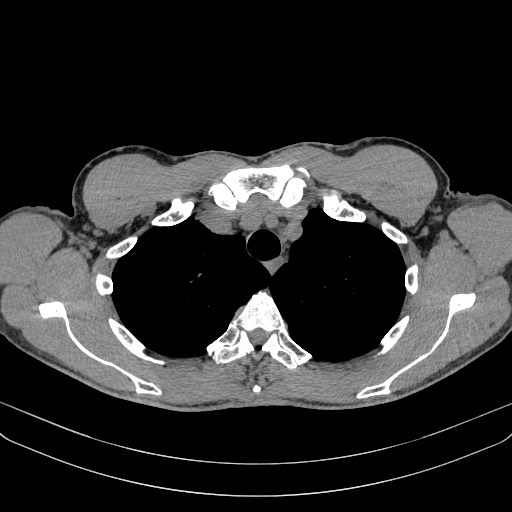
[im 338/429  lung]
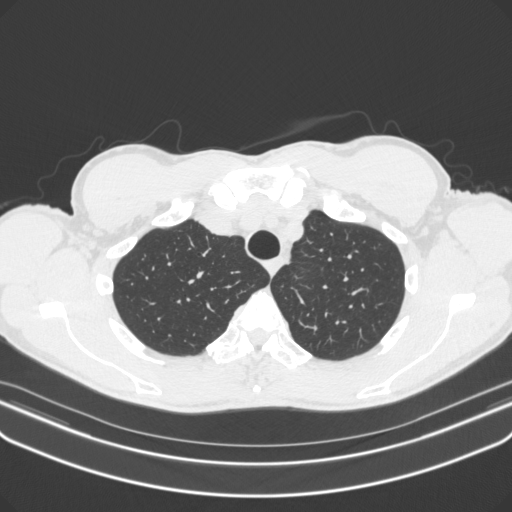
[im 383/429  lung]
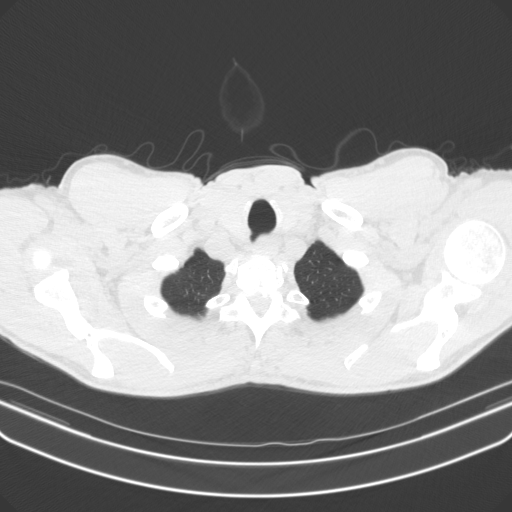
[im 406/429  lung]
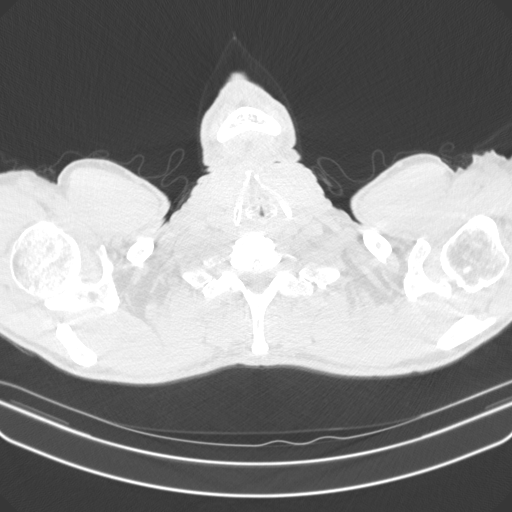

[15 of 31 positions shown; findings below may reference images not displayed]

FINDINGS: Cardiovascular: Mild atherosclerosis of the thoracic aorta the heart
is normal in size. There are coronary artery calcifications. No
pericardial effusion.

Mediastinum/Nodes: No enlarged mediastinal lymph nodes. Nonenlarged
anterior paratracheal node has a hyperdense cortex and may represent
prior granulomatous disease. Limited assessment for hilar adenopathy
on this unenhanced exam. Decompressed esophagus. No thyroid nodule.
No enlarged axillary nodes.

Lungs/Pleura: 4 mm right middle lobe nodule associated with the
minor fissure, series 5, image 89, is unchanged from prior exams.
This is most consistent with a benign subpleural lymph node. There
are no new or additional pulmonary nodules. No airspace
consolidation. No pleural effusion. No features of pulmonary edema.
The trachea and central bronchi are patent.

Upper Abdomen: Ingested material distends the stomach. Patient had
recent abdominal MRI to assess the liver, please reference
10/06/2021 abdominal MRI report. Right renal cysts are partially
included.

Musculoskeletal: No focal bone lesion or acute osseous findings.
Mild thoracic spondylosis.
IMPRESSION: 1. Unchanged 4 mm right middle lobe nodule associated with the minor
fissure, almost certainly a benign subpleural lymph node. This exam
constitutes 1 year imaging stability. One more follow-up exam in 1
year can be considered to document 2 years of imaging stability.
2. No new pulmonary nodules.
3. Coronary artery calcifications.

Aortic Atherosclerosis (YOCF2-FCB.B).

## 2022-05-14 ENCOUNTER — Other Ambulatory Visit: Payer: Self-pay | Admitting: Internal Medicine

## 2022-05-14 ENCOUNTER — Other Ambulatory Visit (HOSPITAL_BASED_OUTPATIENT_CLINIC_OR_DEPARTMENT_OTHER): Payer: Self-pay | Admitting: Internal Medicine

## 2022-05-14 DIAGNOSIS — R911 Solitary pulmonary nodule: Secondary | ICD-10-CM

## 2022-05-15 ENCOUNTER — Other Ambulatory Visit: Payer: Self-pay

## 2022-05-15 ENCOUNTER — Encounter (HOSPITAL_BASED_OUTPATIENT_CLINIC_OR_DEPARTMENT_OTHER): Payer: Self-pay | Admitting: Emergency Medicine

## 2022-05-15 ENCOUNTER — Emergency Department (HOSPITAL_BASED_OUTPATIENT_CLINIC_OR_DEPARTMENT_OTHER)
Admission: EM | Admit: 2022-05-15 | Discharge: 2022-05-15 | Disposition: A | Discharge status: Home / Self Care | Payer: Medicare Other | Attending: Emergency Medicine | Admitting: Emergency Medicine

## 2022-05-15 DIAGNOSIS — J029 Acute pharyngitis, unspecified: Secondary | ICD-10-CM | POA: Diagnosis present

## 2022-05-15 DIAGNOSIS — I10 Essential (primary) hypertension: Secondary | ICD-10-CM | POA: Diagnosis not present

## 2022-05-15 DIAGNOSIS — U071 COVID-19: Secondary | ICD-10-CM | POA: Diagnosis not present

## 2022-05-15 DIAGNOSIS — Z79899 Other long term (current) drug therapy: Secondary | ICD-10-CM | POA: Insufficient documentation

## 2022-05-15 LAB — RESP PANEL BY RT-PCR (FLU A&B, COVID) ARPGX2
Influenza A by PCR: NEGATIVE
Influenza B by PCR: NEGATIVE
SARS Coronavirus 2 by RT PCR: POSITIVE — AB

## 2022-05-15 MED ORDER — FLUTICASONE PROPIONATE 50 MCG/ACT NA SUSP: 2.0000 | Freq: Every day | NASAL | 0 refills | Status: AC

## 2022-05-15 MED ORDER — MOLNUPIRAVIR EUA 200MG CAPSULE: 4.0000 | ORAL_CAPSULE | Freq: Two times a day (BID) | ORAL | 0 refills | Status: AC

## 2022-05-15 NOTE — Discharge Instructions (Addendum)
Please take molnupiravir as prescribed for antiviral symptoms for COVID.  Please drink plenty of fluids and get plenty of rest.  I have also prescribed you fluticasone nasal spray that you can take 2 sprays in each nostril per day.  Please follow-up with your primary care doctor.  Return to the [facility] apartment for any worsening symptoms you might have.

## 2022-05-15 NOTE — ED Notes (Signed)
Pt stated he had home Covid test and it came back positive. Pt requested Covid recheck.

## 2022-05-15 NOTE — ED Triage Notes (Signed)
Pt presents to ED POV w/ family. Pt reports that he has had cough, sneeze, scratchy throat for 3d. Pt reports that he tested positive for covid today w/ home tests. Pt reports increased congestion today. Resp e/u in triage.

## 2022-05-15 NOTE — ED Provider Notes (Signed)
[facility] EMERGENCY DEPT Provider Note   CSN: [id number] Arrival date & time: 05/15/22  1855     History Chief Complaint  Patient presents with   Covid Positive    Shane Lam is a 71 y.o. male with history of hypertension and high cholesterol who presents to the emergency department with a 5-day history of nasal congestion, fatigue, and sore throat.  He tested positive twice at home for COVID and wanted to get reevaluated here today.  He denies any fever, chills, abdominal pain, nausea, vomiting, diarrhea, cough, chest pain, shortness of breath.  HPI     Home Medications Prior to Admission medications   Medication Sig Start Date End Date Taking? Authorizing Provider  fluticasone (FLONASE) 50 MCG/ACT nasal spray Place 2 sprays into both nostrils daily. 05/15/22  Yes Aquinnah Devin M, PA-C  molnupiravir EUA (LAGEVRIO) 200 mg CAPS capsule Take 4 capsules (800 mg total) by mouth 2 (two) times daily for 5 days. 05/15/22 05/20/22 Yes Beckam Abdulaziz M, PA-C  Cholecalciferol (VITAMIN D3) 250 MCG (10000 UT) capsule Take 10,000 Units by mouth once a week.    [provider]  felodipine (PLENDIL) 10 MG 24 hr tablet Take 1 tablet (10 mg total) by mouth daily. 12/14/21   Hilty, Nadean Corwin, MD  Multiple Vitamin (MULTIVITAMIN) tablet Take 1 tablet by mouth daily.      [provider]  potassium chloride SA (KLOR-CON M) 20 MEQ tablet Take 20 mEq by mouth daily.    [provider]  rosuvastatin (CRESTOR) 5 MG tablet Take 1 tablet (5 mg total) by mouth daily. 12/14/21 04/14/22  Pixie Casino, MD  spironolactone (ALDACTONE) 25 MG tablet Take 25 mg by mouth daily. 11/21/20   [provider]  tadalafil (CIALIS) 10 MG tablet 1 tablet    [provider]  telmisartan (MICARDIS) 80 MG tablet Take 80 mg by mouth daily. 10/27/20   [provider]      Allergies    Phenergan [promethazine]    Review of Systems   Review of Systems  All  other systems reviewed and are negative.   Physical Exam Updated Vital Signs There were no vitals taken for this visit. Physical Exam Vitals and nursing note reviewed.  Constitutional:      General: He is not in acute distress.    Appearance: Normal appearance.  HENT:     Head: Normocephalic and atraumatic.  Eyes:     General:        Right eye: No discharge.        Left eye: No discharge.  Cardiovascular:     Comments: Regular rate and rhythm.  S1/S2 are distinct without any evidence of murmur, rubs, or gallops.  Radial pulses are 2+ bilaterally.  Dorsalis pedis pulses are 2+ bilaterally.  No evidence of pedal edema. Pulmonary:     Comments: Clear to auscultation bilaterally.  Normal effort.  No respiratory distress.  No evidence of wheezes, rales, or rhonchi heard throughout. Abdominal:     General: Abdomen is flat. Bowel sounds are normal. There is no distension.     Tenderness: There is no abdominal tenderness. There is no guarding or rebound.  Musculoskeletal:        General: Normal range of motion.     Cervical back: Neck supple.  Skin:    General: Skin is warm and dry.     Findings: No rash.  Neurological:     General: No focal deficit present.  Mental Status: He is alert.  Psychiatric:        Mood and Affect: Mood normal.        Behavior: Behavior normal.     ED Results / Procedures / Treatments   Labs (all labs ordered are listed, but only abnormal results are displayed) Labs Reviewed  RESP PANEL BY RT-PCR (FLU A&B, COVID) ARPGX2 - Abnormal; Notable for the following components:      Result Value   SARS Coronavirus 2 by RT PCR POSITIVE (*)    All other components within normal limits    EKG None  Radiology No results found.  Procedures Procedures    Medications Ordered in ED Medications - No data to display  ED Course/ Medical Decision Making/ A&P                           Medical Decision Making Shane Lam is a 71 y.o. male patient who  presents to the emergency department today for further evaluation of nasal congestion, fatigue, and positive COVID test.  Patient is COVID-positive here.  Patient is in no acute distress.  I will prescribe him molnupiravir for the next 5 days for antiviral therapy.  He is already on day 5 of symptoms.  He will quarantine for the next 2 to equal 7.  Patient has been afebrile.  He is safe for discharge.  I will have him follow-up with his primary care doctor.  Strict return precautions were discussed.    Final Clinical Impression(s) / ED Diagnoses Final diagnoses:  COVID    Rx / DC Orders ED Discharge Orders          Ordered    molnupiravir EUA (LAGEVRIO) 200 mg CAPS capsule  2 times daily        05/15/22 2129    fluticasone (FLONASE) 50 MCG/ACT nasal spray  Daily        05/15/22 2130              Myna Bright [place], Hershal Coria 05/15/22 2139    Wyvonnia Dusky, MD 05/16/22 1019

## 2022-05-15 NOTE — ED Notes (Signed)
Discharge paperwork given and verbally understood. 

## 2022-05-19 ENCOUNTER — Encounter (HOSPITAL_BASED_OUTPATIENT_CLINIC_OR_DEPARTMENT_OTHER): Payer: Self-pay | Admitting: Internal Medicine

## 2022-05-22 ENCOUNTER — Ambulatory Visit (HOSPITAL_BASED_OUTPATIENT_CLINIC_OR_DEPARTMENT_OTHER)
Admission: RE | Admit: 2022-05-22 | Discharge: 2022-05-22 | Disposition: A | Discharge status: Home / Self Care | Payer: Medicare Other | Source: Ambulatory Visit | Attending: Internal Medicine | Admitting: Internal Medicine

## 2022-05-22 DIAGNOSIS — R911 Solitary pulmonary nodule: Secondary | ICD-10-CM | POA: Insufficient documentation

## 2022-05-29 DIAGNOSIS — I10 Essential (primary) hypertension: Secondary | ICD-10-CM | POA: Diagnosis not present

## 2022-05-29 DIAGNOSIS — E7800 Pure hypercholesterolemia, unspecified: Secondary | ICD-10-CM | POA: Diagnosis not present

## 2022-06-02 DIAGNOSIS — I10 Essential (primary) hypertension: Secondary | ICD-10-CM | POA: Diagnosis not present

## 2022-06-09 DIAGNOSIS — R911 Solitary pulmonary nodule: Secondary | ICD-10-CM | POA: Diagnosis not present

## 2022-06-09 DIAGNOSIS — I10 Essential (primary) hypertension: Secondary | ICD-10-CM | POA: Diagnosis not present

## 2022-06-09 DIAGNOSIS — R7989 Other specified abnormal findings of blood chemistry: Secondary | ICD-10-CM | POA: Diagnosis not present

## 2022-07-16 ENCOUNTER — Other Ambulatory Visit: Payer: Self-pay | Admitting: Internal Medicine

## 2022-07-27 DIAGNOSIS — E269 Hyperaldosteronism, unspecified: Secondary | ICD-10-CM | POA: Diagnosis not present

## 2022-07-28 LAB — BASIC METABOLIC PANEL: EGFR: 63

## 2022-08-02 DIAGNOSIS — E269 Hyperaldosteronism, unspecified: Secondary | ICD-10-CM | POA: Diagnosis not present

## 2022-08-02 DIAGNOSIS — I10 Essential (primary) hypertension: Secondary | ICD-10-CM | POA: Diagnosis not present

## 2022-10-04 DIAGNOSIS — H26493 Other secondary cataract, bilateral: Secondary | ICD-10-CM | POA: Diagnosis not present

## 2022-10-04 DIAGNOSIS — H04123 Dry eye syndrome of bilateral lacrimal glands: Secondary | ICD-10-CM | POA: Diagnosis not present

## 2022-10-04 DIAGNOSIS — H18553 Macular corneal dystrophy, bilateral: Secondary | ICD-10-CM | POA: Diagnosis not present

## 2022-10-04 DIAGNOSIS — H35033 Hypertensive retinopathy, bilateral: Secondary | ICD-10-CM | POA: Diagnosis not present

## 2022-10-11 DIAGNOSIS — R931 Abnormal findings on diagnostic imaging of heart and coronary circulation: Secondary | ICD-10-CM | POA: Diagnosis not present

## 2022-10-11 DIAGNOSIS — E785 Hyperlipidemia, unspecified: Secondary | ICD-10-CM | POA: Diagnosis not present

## 2022-10-11 LAB — LIPID PANEL
Chol/HDL Ratio: 2.3 ratio (ref 0.0–5.0)
Cholesterol, Total: 113 mg/dL (ref 100–199)
HDL: 49 mg/dL (ref >39)
LDL Chol Calc (NIH): 53 mg/dL (ref 0–99)
Triglycerides: 46 mg/dL (ref 0–149)
VLDL Cholesterol Cal: 11 mg/dL (ref 5–40)

## 2022-10-21 ENCOUNTER — Ambulatory Visit: Payer: Medicare Other | Attending: Internal Medicine | Admitting: Internal Medicine

## 2022-10-21 ENCOUNTER — Encounter: Payer: Self-pay | Admitting: Internal Medicine

## 2022-10-21 VITALS — BP 130/84 | HR 57 | Ht 69.0 in | Wt 163.2 lb

## 2022-10-21 DIAGNOSIS — E785 Hyperlipidemia, unspecified: Secondary | ICD-10-CM

## 2022-10-21 DIAGNOSIS — E269 Hyperaldosteronism, unspecified: Secondary | ICD-10-CM

## 2022-10-21 DIAGNOSIS — R931 Abnormal findings on diagnostic imaging of heart and coronary circulation: Secondary | ICD-10-CM | POA: Diagnosis not present

## 2022-10-21 DIAGNOSIS — I10 Essential (primary) hypertension: Secondary | ICD-10-CM

## 2022-10-21 NOTE — Patient Instructions (Signed)
Medication Instructions:  NO CHANGES  *If you need a refill on your cardiac medications before your next appointment, please call your pharmacy*   Follow-Up: At [facility], you and your health needs are our priority.  As part of our continuing mission to provide you with exceptional heart care, we have created designated Provider Care Teams.  These Care Teams include your primary Cardiologist (physician) and Advanced Practice Providers (APPs -  Physician Assistants and Nurse Practitioners) who all work together to provide you with the care you need, when you need it.  We recommend signing up for the patient portal called "MyChart".  Sign up information is provided on this After Visit Summary.  MyChart is used to connect with patients for Virtual Visits (Telemedicine).  Patients are able to view lab/test results, encounter notes, upcoming appointments, etc.  Non-urgent messages can be sent to your provider as well.   To learn more about what you can do with MyChart, go to [email or web address].    Your next appointment:    12 months with Dr. Hilty  

## 2022-10-21 NOTE — Progress Notes (Signed)
OFFICE CONSULT NOTE  Chief Complaint:  Follow-up dyslipidemia  Primary Care Physician: [name], MD  HPI:  Shane Lam is a 72 y.o. male who is being seen today for the evaluation of hypertension and dyslipidemia at the request of [name], MD. this is a pleasant 72 year old male kindly referred for evaluation management of hypertension and dyslipidemia.  Recently he has been having some difficulty with hypertension management.  He carries a diagnosis of Conn syndrome.  He was previously managed by endocrinologist and Dr. Lambert Mody practice and has been on 50 mg spironolactone but apparently this caused issues with his renal function.  It also been having issues with hypokalemia related to hyperaldosteronism, requiring potassium supplementation.  He was on a mixed diuretic with triamterene/HCTZ however that did not seem to help his hypokalemia enough.  Blood pressure diary was reviewed by myself and indicates blood pressures generally between [id number] systolic.  Another issue reportedly was with bradycardia, precluding any beta-blocker or nondihydropyridine calcium channel blocker.  Mr. Niece has had a lot of recent imaging including ultrasound which indicated fatty liver however his liver enzymes recently have been normal.  Calcium score was performed in March 2022 which showed a total of 114, 65th percentile for age and sex matched controls.  There was also a 4 mm nodule along the right minor fissure which will need follow-up.  The aorta measured 3.9 cm in the a sending portion, this should be followed up as well in a year.  12/14/2021  [name] returns today for follow-up.  Blood pressure appears to be somewhat elevated.  Initially 142/96 but repeat was 138/96.  According to his wife he was apparently taken off of 10 mg of felodipine during his last hospitalization and is currently only on 5 mg.  Otherwise as mentioned above he was noted to have multivessel coronary calcium and  despite aggressive diet and exercise and being normal weight, his cholesterol remains elevated.  LDL in November was 121.  Recent repeat lipids show total cholesterol 170, HDL 46, triglycerides 47 and LDL of 114.  His goal LDL is less than 70.  04/14/2022  [name] is seen today for follow-up of dyslipidemia.  He has had a nice response to statin therapy on low-dose rosuvastatin.  Total cholesterol now 124, triglycerides 51, HDL 48 and LDL 64.  He denies any side effects with the medication.  Blood pressure is also well controlled today at 120/82.  He was noted to have a recently mildly elevated creatinine at 1.3.  He has not had follow-up labs although this may be related to spironolactone.  He was diagnosed with hyperaldosteronism.  Initially he was on 50 mg of Aldactone but was cut back to 25 mg.  He apparently has follow-up with his PCP next month.  10/21/2022  [name] is seen today in follow-up.  Overall he is feeling quite well.  He had recent repeat lipids performed which showed total cholesterol 113, HDL 49, triglycerides 46 and LDL 53.  Blood pressure at home is well-controlled between [id number] systolic over [id number] diastolic.  EKG was performed today showing sinus bradycardia with some PACs.  He reports some occasional palpitations but is not bothered by them.   PMHx:  Past Medical History:  Diagnosis Date   Allergic rhinitis due to pollen    Essential hypertension, malignant    Nonspecific abnormal electrocardiogram (ECG) (EKG)    Pure hypercholesterolemia     FAMHx:  Family History  Problem Relation Age of Onset  CAD Mother    Brain cancer Mother    CAD Father    Pancreatic cancer Father    Lung cancer Maternal Aunt     SOCHx:   reports that he has never smoked. He has never used smokeless tobacco. He reports that he does not drink alcohol and does not use drugs.  ALLERGIES:  Allergies  Allergen Reactions   Phenergan [Promethazine] Other (See Comments)    Makes Pt  delirious    ROS: Pertinent items noted in HPI and remainder of comprehensive ROS otherwise negative.  HOME MEDS: Current Outpatient Medications on File Prior to Visit  Medication Sig Dispense Refill   Cholecalciferol (VITAMIN D3) 250 MCG (10000 UT) capsule Take 10,000 Units by mouth once a week.     felodipine (PLENDIL) 10 MG 24 hr tablet Take 1 tablet (10 mg total) by mouth daily. 90 tablet 3   fluticasone (FLONASE) 50 MCG/ACT nasal spray Place 2 sprays into both nostrils daily. 18.2 mL 0   Multiple Vitamin (MULTIVITAMIN) tablet Take 1 tablet by mouth daily.       potassium chloride SA (KLOR-CON M) 20 MEQ tablet Take 20 mEq by mouth daily.     rosuvastatin (CRESTOR) 5 MG tablet Take 1 tablet (5 mg total) by mouth daily. 90 tablet 3   spironolactone (ALDACTONE) 25 MG tablet Take 25 mg by mouth daily.     tadalafil (CIALIS) 10 MG tablet 1 tablet     telmisartan (MICARDIS) 80 MG tablet Take 80 mg by mouth daily.     No current facility-administered medications on file prior to visit.    LABS/IMAGING: No results found for this or any previous visit (from the past 48 hour(s)). No results found.  LIPID PANEL:    Component Value Date/Time   CHOL 113 10/11/2022 1046   TRIG 46 10/11/2022 1046   HDL 49 10/11/2022 1046   CHOLHDL 2.3 10/11/2022 1046   CHOLHDL 5.6 07/05/2021 0718   VLDL 15 07/05/2021 0718   LDLCALC 53 10/11/2022 1046    WEIGHTS: Wt Readings from Last 3 Encounters:  10/21/22 163 lb 3.2 oz (74 kg)  04/14/22 152 lb (68.9 kg)  12/14/21 148 lb 12.8 oz (67.5 kg)    VITALS: BP 130/84   Pulse (!) 57   Ht 5' 9"$  (1.753 m)   Wt 163 lb 3.2 oz (74 kg)   BMI 24.10 kg/m   EXAM: Deferred  EKG: Sinus bradycardia with PAC at 57-personally reviewed  ASSESSMENT: Mixed dyslipidemia, goal LDL <70 Conn syndrome/hyperaldosteronism Dilated ascending aorta to 39 mm (repeat CT studies do not indicated this) Elevated CAC score of 114, 65th percentile for age and sex matched  control Solitary pulmonary nodule -considered benign by serial CT  PLAN: 1.   [name] has had continued improvement in his lipids with LDL at target less than 70.  Blood pressure appears well-controlled.  He did have an elevated calcium score but is asymptomatic.  He had follow-up imaging of the pulmonary nodule which is now considered benign.  Overall he is doing well.  Plan follow-up with me annually or sooner as necessary.  Pixie Casino, MD, [facility] [place] Director of the [facility]  [facility] Diplomate of the American Board of Clinical Lipidology Attending Cardiologist  Direct Dial: [phone]  Fax: [phone]  Website:  [email or web address] 10/21/2022, 10:38 AM

## 2022-10-29 DIAGNOSIS — I10 Essential (primary) hypertension: Secondary | ICD-10-CM | POA: Diagnosis not present

## 2022-10-29 DIAGNOSIS — Z125 Encounter for screening for malignant neoplasm of prostate: Secondary | ICD-10-CM | POA: Diagnosis not present

## 2022-11-02 DIAGNOSIS — Z0000 Encounter for general adult medical examination without abnormal findings: Secondary | ICD-10-CM | POA: Diagnosis not present

## 2022-11-02 DIAGNOSIS — E559 Vitamin D deficiency, unspecified: Secondary | ICD-10-CM | POA: Diagnosis not present

## 2022-11-02 DIAGNOSIS — I10 Essential (primary) hypertension: Secondary | ICD-10-CM | POA: Diagnosis not present

## 2022-11-02 DIAGNOSIS — N281 Cyst of kidney, acquired: Secondary | ICD-10-CM | POA: Diagnosis not present

## 2022-11-02 DIAGNOSIS — I2510 Atherosclerotic heart disease of native coronary artery without angina pectoris: Secondary | ICD-10-CM | POA: Diagnosis not present

## 2022-11-02 DIAGNOSIS — K862 Cyst of pancreas: Secondary | ICD-10-CM | POA: Diagnosis not present

## 2022-11-02 DIAGNOSIS — D6862 Lupus anticoagulant syndrome: Secondary | ICD-10-CM | POA: Diagnosis not present

## 2022-11-02 DIAGNOSIS — N1831 Chronic kidney disease, stage 3a: Secondary | ICD-10-CM | POA: Diagnosis not present

## 2022-11-02 DIAGNOSIS — N401 Enlarged prostate with lower urinary tract symptoms: Secondary | ICD-10-CM | POA: Diagnosis not present

## 2022-11-02 DIAGNOSIS — R911 Solitary pulmonary nodule: Secondary | ICD-10-CM | POA: Diagnosis not present

## 2022-11-02 DIAGNOSIS — E269 Hyperaldosteronism, unspecified: Secondary | ICD-10-CM | POA: Diagnosis not present

## 2022-11-02 DIAGNOSIS — I700 Atherosclerosis of aorta: Secondary | ICD-10-CM | POA: Diagnosis not present

## 2022-11-09 DIAGNOSIS — Z86010 Personal history of colonic polyps: Secondary | ICD-10-CM | POA: Diagnosis not present

## 2022-11-09 DIAGNOSIS — R933 Abnormal findings on diagnostic imaging of other parts of digestive tract: Secondary | ICD-10-CM | POA: Diagnosis not present

## 2022-11-09 DIAGNOSIS — E782 Mixed hyperlipidemia: Secondary | ICD-10-CM | POA: Diagnosis not present

## 2022-11-09 DIAGNOSIS — I10 Essential (primary) hypertension: Secondary | ICD-10-CM | POA: Diagnosis not present

## 2022-11-09 DIAGNOSIS — R748 Abnormal levels of other serum enzymes: Secondary | ICD-10-CM | POA: Diagnosis not present

## 2022-11-11 DIAGNOSIS — N402 Nodular prostate without lower urinary tract symptoms: Secondary | ICD-10-CM | POA: Diagnosis not present

## 2022-11-19 ENCOUNTER — Other Ambulatory Visit: Payer: Self-pay | Admitting: *Deleted

## 2022-11-19 MED ORDER — ROSUVASTATIN CALCIUM 5 MG PO TABS: 5.0000 mg | ORAL_TABLET | Freq: Every day | ORAL | 3 refills | Status: DC

## 2022-11-30 DIAGNOSIS — E269 Hyperaldosteronism, unspecified: Secondary | ICD-10-CM | POA: Diagnosis not present

## 2022-11-30 DIAGNOSIS — N62 Hypertrophy of breast: Secondary | ICD-10-CM | POA: Diagnosis not present

## 2022-11-30 DIAGNOSIS — E876 Hypokalemia: Secondary | ICD-10-CM | POA: Diagnosis not present

## 2022-12-13 DIAGNOSIS — J301 Allergic rhinitis due to pollen: Secondary | ICD-10-CM | POA: Diagnosis not present

## 2022-12-13 DIAGNOSIS — R030 Elevated blood-pressure reading, without diagnosis of hypertension: Secondary | ICD-10-CM | POA: Diagnosis not present

## 2022-12-28 DIAGNOSIS — N62 Hypertrophy of breast: Secondary | ICD-10-CM | POA: Diagnosis not present

## 2022-12-28 DIAGNOSIS — E269 Hyperaldosteronism, unspecified: Secondary | ICD-10-CM | POA: Diagnosis not present

## 2022-12-29 ENCOUNTER — Telehealth: Payer: Self-pay | Admitting: Internal Medicine

## 2022-12-29 LAB — BASIC METABOLIC PANEL: EGFR: 54

## 2022-12-29 NOTE — Telephone Encounter (Signed)
Patient's wife has concerns with labs patient had drawn on 4/30. She would like a call back to discuss.

## 2022-12-29 NOTE — Telephone Encounter (Signed)
Attempted to call patient's wife left message for patient's wife to call back to the office. Okay per [facility]

## 2022-12-30 ENCOUNTER — Telehealth: Payer: Self-pay | Admitting: Internal Medicine

## 2022-12-30 NOTE — Telephone Encounter (Signed)
Paper Work Dropped Off:  Lab Results  Date: 12/30/2022  Location of paper:  Provider Mailbox

## 2022-12-30 NOTE — Telephone Encounter (Signed)
error 

## 2022-12-30 NOTE — Telephone Encounter (Signed)
Thurmon Fair, MD 10/12/2022 10:55 AM EST  All lipid parameters are in desirable range. Please reach out via MyChart or call [phone] if you have any questions/concerns.   Thank you, Eileen Stanford, RN Written by Lindell Spar, RN on 10/12/2022  2:09 PM EST  Seen by patient Shane Lam on 11/10/2022  8:20 PM ------------------------------------------------------------------------------- Returned call to wife she states that Rheumatologist (@ [facility]) drew lab and states that she is concerned that kidney function was "too low". Informed that we cannot see their  results and she will either send via mychart or drop off these lab results for Dr Rennis Golden to review. Verbalized understanding

## 2023-01-17 ENCOUNTER — Other Ambulatory Visit: Payer: Self-pay | Admitting: Internal Medicine

## 2023-01-26 DIAGNOSIS — H5712 Ocular pain, left eye: Secondary | ICD-10-CM | POA: Diagnosis not present

## 2023-05-05 ENCOUNTER — Encounter (HOSPITAL_COMMUNITY): Payer: Self-pay | Admitting: *Deleted

## 2023-05-05 ENCOUNTER — Ambulatory Visit (HOSPITAL_COMMUNITY)
Admission: EM | Admit: 2023-05-05 | Discharge: 2023-05-05 | Disposition: A | Discharge status: Home / Self Care | Payer: Medicare Other | Attending: Internal Medicine | Admitting: Internal Medicine

## 2023-05-05 DIAGNOSIS — U071 COVID-19: Secondary | ICD-10-CM | POA: Insufficient documentation

## 2023-05-05 NOTE — ED Provider Notes (Signed)
[facility]    CSN: [id number] Arrival date & time: 05/05/23  1434      History   Chief Complaint Chief Complaint  Patient presents with   Covid Positive   Headache   Generalized Body Aches    HPI Shane Lam is a 72 y.o. male comes to the urgent care to be evaluated for COVID-19.  Patient endorses a 4-day history of generalized bodyaches, cough and headache.  He confirms exposure to case of COVID-19.  No nausea, vomiting or diarrhea.  Patient tested positive for COVID-19 using a home COVID test.  HPI  Past Medical History:  Diagnosis Date   Allergic rhinitis due to pollen    Essential hypertension, malignant    Nonspecific abnormal electrocardiogram (ECG) (EKG)    Pure hypercholesterolemia     Patient Active Problem List   Diagnosis Date Noted   Aortic atherosclerosis (HCC) 07/12/2021   Pancreas cyst 07/12/2021   Coagulopathy (HCC)    Protein-calorie malnutrition, severe 07/08/2021   Liver lesion 07/08/2021   Hypokalemia 07/08/2021   Essential hypertension 07/08/2021   Bradycardia 07/08/2021   Sepsis (HCC) 07/01/2021    Past Surgical History:  Procedure Laterality Date   FLEXIBLE SIGMOIDOSCOPY N/A 07/03/2021   Procedure: FLEXIBLE SIGMOIDOSCOPY;  Surgeon: Jeani Hawking, MD;  Location: [facility] ENDOSCOPY;  Service: Endoscopy;  Laterality: N/A;       Home Medications    Prior to Admission medications   Medication Sig Start Date End Date Taking? Authorizing Provider  Cholecalciferol (VITAMIN D3) 250 MCG (10000 UT) capsule Take 10,000 Units by mouth once a week.   Yes [provider]  eplerenone (INSPRA) 25 MG tablet Take 25 mg by mouth daily. 04/08/23  Yes [provider]  felodipine (PLENDIL) 10 MG 24 hr tablet TAKE 1 TABLET BY MOUTH EVERY DAY 01/17/23  Yes Hilty, Lisette Abu, MD  Multiple Vitamin (MULTIVITAMIN) tablet Take 1 tablet by mouth daily.     Yes [provider]  potassium chloride SA (KLOR-CON M) 20 MEQ tablet Take 20  mEq by mouth daily.   Yes [provider]  rosuvastatin (CRESTOR) 5 MG tablet Take 1 tablet (5 mg total) by mouth daily. 11/19/22  Yes Hilty, Lisette Abu, MD  spironolactone (ALDACTONE) 25 MG tablet Take 25 mg by mouth daily. 11/21/20  Yes [provider]  telmisartan (MICARDIS) 80 MG tablet Take 80 mg by mouth daily. 10/27/20  Yes [provider]  fluticasone (FLONASE) 50 MCG/ACT nasal spray Place 2 sprays into both nostrils daily. 05/15/22   Teressa Lower, PA-C  tadalafil (CIALIS) 10 MG tablet 1 tablet    [provider]    Family History Family History  Problem Relation Age of Onset   CAD Mother    Brain cancer Mother    CAD Father    Pancreatic cancer Father    Lung cancer Maternal Aunt     Social History Social History   Tobacco Use   Smoking status: Never   Smokeless tobacco: Never  Vaping Use   Vaping status: Never Used  Substance Use Topics   Alcohol use: Never   Drug use: Never     Allergies   Phenergan [promethazine]   Review of Systems Review of Systems As per HPI  Physical Exam Triage Vital Signs ED Triage Vitals  Encounter Vitals Group     BP 05/05/23 1602 (!) 167/95     Systolic BP Percentile --      Diastolic BP Percentile --  Pulse Rate 05/05/23 1602 (!) 51     Resp 05/05/23 1602 18     Temp 05/05/23 1602 98.6 F (37 C)     Temp Source 05/05/23 1602 Oral     SpO2 05/05/23 1602 97 %     Weight --      Height --      Head Circumference --      Peak Flow --      Pain Score 05/05/23 1559 3     Pain Loc --      Pain Education --      Exclude from Growth Chart --    No data found.  Updated Vital Signs BP (!) 167/95 (BP Location: Right Arm)   Pulse (!) 51   Temp 98.6 F (37 C) (Oral)   Resp 18   SpO2 97%   Visual Acuity Right Eye Distance:   Left Eye Distance:   Bilateral Distance:    Right Eye Near:   Left Eye Near:    Bilateral Near:     Physical Exam Vitals and nursing note reviewed.   Constitutional:      General: He is not in acute distress.    Appearance: He is not ill-appearing.  Cardiovascular:     Rate and Rhythm: Normal rate and regular rhythm.     Heart sounds: Normal heart sounds.  Pulmonary:     Effort: Pulmonary effort is normal.     Breath sounds: Normal breath sounds. No wheezing or rhonchi.  Abdominal:     General: Bowel sounds are normal.     Palpations: Abdomen is soft.  Neurological:     Mental Status: He is alert and oriented to person, place, and time.      UC Treatments / Results  Labs (all labs ordered are listed, but only abnormal results are displayed) Labs Reviewed  SARS CORONAVIRUS 2 (TAT 6-24 HRS) - Abnormal; Notable for the following components:      Result Value   SARS Coronavirus 2 POSITIVE (*)    All other components within normal limits    EKG   Radiology No results found.  Procedures Procedures (including critical care time)  Medications Ordered in UC Medications - No data to display  Initial Impression / Assessment and Plan / UC Course  I have reviewed the triage vital signs and the nursing notes.  Pertinent labs & imaging results that were available during my care of the patient were reviewed by me and considered in my medical decision making (see chart for details).     1.  COVID-19 infection: Patient requesting COVID-19 test for insurance purposes We discussed the potential benefit of Paxlovid.  Patient is feeling much better.  He has been vaccinated against COVID-19 in the past.  He has no other medical problems.  Patient is comfortable not taking Paxlovid given his recovery and improvement in symptoms. Return precautions given Final Clinical Impressions(s) / UC Diagnoses   Final diagnoses:  COVID-19 virus infection     Discharge Instructions      Please maintain adequate hydration Please take Tylenol or ibuprofen as needed for pain and/or fever You may check your MyChart account for the  results. Please feel free to return to urgent care if you have any other concerns.     ED Prescriptions   None    PDMP not reviewed this encounter.   Merrilee Jansky, MD 05/06/23 [phone]

## 2023-05-05 NOTE — ED Triage Notes (Signed)
Pt states he was exposed to COVID when he went out of town. He started with sx of headache, body aches x 4 days. He took an at home covid test today and it was positive.

## 2023-05-05 NOTE — Discharge Instructions (Signed)
Please maintain adequate hydration Please take Tylenol or ibuprofen as needed for pain and/or fever You may check your MyChart account for the results. Please feel free to return to urgent care if you have any other concerns.

## 2023-05-06 LAB — SARS CORONAVIRUS 2 (TAT 6-24 HRS): SARS Coronavirus 2: POSITIVE — AB

## 2023-05-17 DIAGNOSIS — U071 COVID-19: Secondary | ICD-10-CM | POA: Diagnosis not present

## 2023-05-27 ENCOUNTER — Other Ambulatory Visit: Payer: Self-pay | Admitting: Internal Medicine

## 2023-05-27 DIAGNOSIS — R911 Solitary pulmonary nodule: Secondary | ICD-10-CM

## 2023-06-02 DIAGNOSIS — N402 Nodular prostate without lower urinary tract symptoms: Secondary | ICD-10-CM | POA: Diagnosis not present

## 2023-06-14 ENCOUNTER — Ambulatory Visit
Admission: RE | Admit: 2023-06-14 | Discharge: 2023-06-14 | Disposition: A | Discharge status: Home / Self Care | Payer: Medicare Other | Source: Ambulatory Visit | Attending: Internal Medicine | Admitting: Internal Medicine

## 2023-06-14 DIAGNOSIS — R911 Solitary pulmonary nodule: Secondary | ICD-10-CM

## 2023-06-14 DIAGNOSIS — I2510 Atherosclerotic heart disease of native coronary artery without angina pectoris: Secondary | ICD-10-CM | POA: Diagnosis not present

## 2023-06-14 DIAGNOSIS — I700 Atherosclerosis of aorta: Secondary | ICD-10-CM | POA: Diagnosis not present

## 2023-08-03 DIAGNOSIS — I10 Essential (primary) hypertension: Secondary | ICD-10-CM | POA: Diagnosis not present

## 2023-08-03 DIAGNOSIS — E269 Hyperaldosteronism, unspecified: Secondary | ICD-10-CM | POA: Diagnosis not present

## 2023-08-15 DIAGNOSIS — Z23 Encounter for immunization: Secondary | ICD-10-CM | POA: Diagnosis not present

## 2023-09-15 DIAGNOSIS — Z23 Encounter for immunization: Secondary | ICD-10-CM | POA: Diagnosis not present

## 2023-10-24 ENCOUNTER — Other Ambulatory Visit: Payer: Self-pay | Admitting: Internal Medicine

## 2023-10-24 DIAGNOSIS — K862 Cyst of pancreas: Secondary | ICD-10-CM

## 2023-10-31 ENCOUNTER — Emergency Department (HOSPITAL_COMMUNITY)

## 2023-10-31 ENCOUNTER — Emergency Department (HOSPITAL_COMMUNITY)
Admission: EM | Admit: 2023-10-31 | Discharge: 2023-11-01 | Disposition: A | Discharge status: Home / Self Care | Attending: Emergency Medicine | Admitting: Emergency Medicine

## 2023-10-31 ENCOUNTER — Encounter (HOSPITAL_COMMUNITY): Payer: Self-pay | Admitting: Emergency Medicine

## 2023-10-31 ENCOUNTER — Other Ambulatory Visit: Payer: Self-pay

## 2023-10-31 DIAGNOSIS — R42 Dizziness and giddiness: Secondary | ICD-10-CM | POA: Diagnosis not present

## 2023-10-31 DIAGNOSIS — E876 Hypokalemia: Secondary | ICD-10-CM | POA: Insufficient documentation

## 2023-10-31 DIAGNOSIS — D72829 Elevated white blood cell count, unspecified: Secondary | ICD-10-CM | POA: Diagnosis not present

## 2023-10-31 DIAGNOSIS — R220 Localized swelling, mass and lump, head: Secondary | ICD-10-CM | POA: Diagnosis not present

## 2023-10-31 DIAGNOSIS — I10 Essential (primary) hypertension: Secondary | ICD-10-CM | POA: Diagnosis not present

## 2023-10-31 DIAGNOSIS — R531 Weakness: Secondary | ICD-10-CM | POA: Insufficient documentation

## 2023-10-31 DIAGNOSIS — R5383 Other fatigue: Secondary | ICD-10-CM | POA: Diagnosis not present

## 2023-10-31 DIAGNOSIS — G459 Transient cerebral ischemic attack, unspecified: Secondary | ICD-10-CM | POA: Diagnosis not present

## 2023-10-31 DIAGNOSIS — Z79899 Other long term (current) drug therapy: Secondary | ICD-10-CM | POA: Insufficient documentation

## 2023-10-31 DIAGNOSIS — R1111 Vomiting without nausea: Secondary | ICD-10-CM | POA: Diagnosis not present

## 2023-10-31 DIAGNOSIS — R110 Nausea: Secondary | ICD-10-CM | POA: Diagnosis not present

## 2023-10-31 LAB — CBC
HCT: 47 % (ref 39.0–52.0)
Hemoglobin: 15.4 g/dL (ref 13.0–17.0)
MCH: 30 pg (ref 26.0–34.0)
MCHC: 32.8 g/dL (ref 30.0–36.0)
MCV: 91.4 fL (ref 80.0–100.0)
Platelets: 220 10*3/uL (ref 150–400)
RBC: 5.14 MIL/uL (ref 4.22–5.81)
RDW: 13.2 % (ref 11.5–15.5)
WBC: 13.5 10*3/uL — ABNORMAL HIGH (ref 4.0–10.5)
nRBC: 0 % (ref 0.0–0.2)

## 2023-10-31 LAB — BASIC METABOLIC PANEL
Anion gap: 10 (ref 5–15)
BUN: 14 mg/dL (ref 8–23)
CO2: 28 mmol/L (ref 22–32)
Calcium: 9.1 mg/dL (ref 8.9–10.3)
Chloride: 102 mmol/L (ref 98–111)
Creatinine, Ser: 1.24 mg/dL (ref 0.61–1.24)
GFR, Estimated: >60 mL/min (ref >60)
Glucose, Bld: 108 mg/dL — ABNORMAL HIGH (ref 70–99)
Potassium: 3.1 mmol/L — ABNORMAL LOW (ref 3.5–5.1)
Sodium: 140 mmol/L (ref 135–145)

## 2023-10-31 LAB — URINALYSIS, ROUTINE W REFLEX MICROSCOPIC
Bilirubin Urine: NEGATIVE
Glucose, UA: NEGATIVE mg/dL
Hgb urine dipstick: NEGATIVE
Ketones, ur: 5 mg/dL — AB
Leukocytes,Ua: NEGATIVE
Nitrite: NEGATIVE
Protein, ur: NEGATIVE mg/dL
Specific Gravity, Urine: 1.011 (ref 1.005–1.030)
pH: 8 (ref 5.0–8.0)

## 2023-10-31 LAB — CBG MONITORING, ED: Glucose-Capillary: 85 mg/dL (ref 70–99)

## 2023-10-31 LAB — TROPONIN I (HIGH SENSITIVITY)
Troponin I (High Sensitivity): 5 ng/L (ref <18)
Troponin I (High Sensitivity): 6 ng/L (ref <18)

## 2023-10-31 LAB — MAGNESIUM: Magnesium: 2 mg/dL (ref 1.7–2.4)

## 2023-10-31 MED ORDER — POTASSIUM CHLORIDE CRYS ER 20 MEQ PO TBCR
40.0000 meq | EXTENDED_RELEASE_TABLET | Freq: Once | ORAL | Status: AC
  Administered 2023-10-31: 40 meq via ORAL
  Filled 2023-10-31: qty 2

## 2023-10-31 MED ORDER — MECLIZINE HCL 12.5 MG PO TABS: 12.5000 mg | ORAL_TABLET | Freq: Three times a day (TID) | ORAL | 0 refills | Status: AC | PRN

## 2023-10-31 MED ORDER — MECLIZINE HCL 25 MG PO TABS
12.5000 mg | ORAL_TABLET | Freq: Once | ORAL | Status: AC
  Administered 2023-10-31: 12.5 mg via ORAL
  Filled 2023-10-31: qty 1

## 2023-10-31 MED ORDER — LACTATED RINGERS IV BOLUS
1000.0000 mL | Freq: Once | INTRAVENOUS | Status: AC
  Administered 2023-10-31: 1000 mL via INTRAVENOUS

## 2023-10-31 MED ORDER — IOHEXOL 350 MG/ML SOLN
75.0000 mL | Freq: Once | INTRAVENOUS | Status: AC | PRN
  Administered 2023-10-31: 75 mL via INTRAVENOUS

## 2023-10-31 NOTE — Discharge Instructions (Signed)
 Test results today are reassuring.  Your potassium was low but this was replaced.  Your MRI showed a meningioma.  This was also present on your imaging 3 years ago.  These are typically benign lesions.  If you would like to follow-up with a neurosurgeon, telephone number is below.  Ensure that you stay hydrated.  A prescription for medication called meclizine was sent to your pharmacy.  Take this only as needed for dizziness.  Return to the emergency department for any new or worsening symptoms of concern.

## 2023-10-31 NOTE — ED Triage Notes (Signed)
 Pt BIB by EMS for generalized weakness/dizziness since waking up this morning. Denies any pain or hx of vertigo. Dizziness is worse with position changes. CBG 124.

## 2023-10-31 NOTE — ED Provider Notes (Signed)
 [place] [facility] DE[facility] AT [facility] Provider Note   CSN: [id number] Arrival date & time: 10/31/23  1213     History  Chief Complaint  Patient presents with   Weakness    Shane Lam is a 73 y.o. male.   Weakness Associated symptoms: dizziness   Patient presents for generalized weakness and dizziness.  Medical history includes HTN, bradycardia, HLD.  Yesterday, he was in his normal state of health.  This morning, he woke up feeling fine.  He took his morning medications between 6 and 7 AM.  He did some Bible study with his wife.  At around 8:30 AM, he had onset of generalized weakness, fatigue, feeling of warmth, diaphoresis, and room spinning sensation.  Symptoms have improved since that time.  Currently, at rest, he is asymptomatic.     Home Medications Prior to Admission medications   Medication Sig Start Date End Date Taking? Authorizing Provider  meclizine (ANTIVERT) 12.5 MG tablet Take 1 tablet (12.5 mg total) by mouth 3 (three) times daily as needed for dizziness. 10/31/23  Yes Gloris Manchester, MD  Cholecalciferol (VITAMIN D3) 250 MCG (10000 UT) capsule Take 10,000 Units by mouth once a week.    [provider]  eplerenone (INSPRA) 25 MG tablet Take 25 mg by mouth daily. 04/08/23   [provider]  felodipine (PLENDIL) 10 MG 24 hr tablet TAKE 1 TABLET BY MOUTH EVERY DAY 01/17/23   Hilty, Lisette Abu, MD  fluticasone (FLONASE) 50 MCG/ACT nasal spray Place 2 sprays into both nostrils daily. 05/15/22   Teressa Lower, PA-C  Multiple Vitamin (MULTIVITAMIN) tablet Take 1 tablet by mouth daily.      [provider]  potassium chloride SA (KLOR-CON M) 20 MEQ tablet Take 20 mEq by mouth daily.    [provider]  rosuvastatin (CRESTOR) 5 MG tablet Take 1 tablet (5 mg total) by mouth daily. 11/19/22   Chrystie Nose, MD  spironolactone (ALDACTONE) 25 MG tablet Take 25 mg by mouth daily. 11/21/20   [provider]  tadalafil  (CIALIS) 10 MG tablet 1 tablet    [provider]  telmisartan (MICARDIS) 80 MG tablet Take 80 mg by mouth daily. 10/27/20   [provider]      Allergies    Phenergan [promethazine]    Review of Systems   Review of Systems  Constitutional:  Positive for fatigue.  Neurological:  Positive for dizziness, weakness (Generalized) and light-headedness.  All other systems reviewed and are negative.   Physical Exam Updated Vital Signs BP (!) 134/95   Pulse 81   Temp 97.7 F (36.5 C)   Resp 18   SpO2 100%  Physical Exam Vitals and nursing note reviewed.  Constitutional:      General: He is not in acute distress.    Appearance: Normal appearance. He is well-developed. He is not ill-appearing, toxic-appearing or diaphoretic.  HENT:     Head: Normocephalic and atraumatic.     Right Ear: External ear normal.     Left Ear: External ear normal.     Nose: Nose normal.     Mouth/Throat:     Mouth: Mucous membranes are moist.  Eyes:     Extraocular Movements: Extraocular movements intact.     Conjunctiva/sclera: Conjunctivae normal.  Cardiovascular:     Rate and Rhythm: Normal rate and regular rhythm.  Pulmonary:     Effort: Pulmonary effort is normal. No respiratory distress.     Breath sounds:  Normal breath sounds.  Abdominal:     General: There is no distension.     Palpations: Abdomen is soft.     Tenderness: There is no abdominal tenderness.  Musculoskeletal:        General: No swelling. Normal range of motion.     Cervical back: Normal range of motion and neck supple.  Skin:    General: Skin is warm and dry.  Neurological:     General: No focal deficit present.     Mental Status: He is alert and oriented to person, place, and time.     Cranial Nerves: No cranial nerve deficit.     Sensory: No sensory deficit.     Motor: No weakness.     Coordination: Coordination normal.  Psychiatric:        Mood and Affect: Mood normal.        Behavior: Behavior  normal.     ED Results / Procedures / Treatments   Labs (all labs ordered are listed, but only abnormal results are displayed) Labs Reviewed  BASIC METABOLIC PANEL - Abnormal; Notable for the following components:      Result Value   Potassium 3.1 (*)    Glucose, Bld 108 (*)    All other components within normal limits  CBC - Abnormal; Notable for the following components:   WBC 13.5 (*)    All other components within normal limits  URINALYSIS, ROUTINE W REFLEX MICROSCOPIC - Abnormal; Notable for the following components:   APPearance HAZY (*)    Ketones, ur 5 (*)    All other components within normal limits  MAGNESIUM  CBG MONITORING, ED  TROPONIN I (HIGH SENSITIVITY)  TROPONIN I (HIGH SENSITIVITY)    EKG EKG Interpretation Date/Time:  Monday October 31 2023 13:36:19 EST Ventricular Rate:  63 PR Interval:  142 QRS Duration:  84 QT Interval:  442 QTC Calculation: 452 R Axis:   -22  Text Interpretation: Normal sinus rhythm Cannot rule out Anteroseptal infarct , age undetermined Confirmed by Gloris Manchester [phone]) on 10/31/2023 5:04:50 PM  Radiology CT ANGIO HEAD NECK W WO CM Result Date: 10/31/2023 CLINICAL DATA:  Transient ischemic attack (TIA) EXAM: CT ANGIOGRAPHY HEAD AND NECK WITH AND WITHOUT CONTRAST TECHNIQUE: Multidetector CT imaging of the head and neck was performed using the standard protocol during bolus administration of intravenous contrast. Multiplanar CT image reconstructions and MIPs were obtained to evaluate the vascular anatomy. Carotid stenosis measurements (when applicable) are obtained utilizing NASCET criteria, using the distal internal carotid diameter as the denominator. RADIATION DOSE REDUCTION: This exam was performed according to the departmental dose-optimization program which includes automated exposure control, adjustment of the mA and/or kV according to patient size and/or use of iterative reconstruction technique. CONTRAST:  75mL OMNIPAQUE IOHEXOL 350  MG/ML SOLN COMPARISON:  MRI head from today. FINDINGS: CTA NECK FINDINGS Aortic arch: Great vessel origins are patent without significant stenosis. Right carotid system: No evidence of dissection, stenosis (50% or greater), or occlusion. Left carotid system: No evidence of dissection, stenosis (50% or greater), or occlusion. Vertebral arteries: Left dominant. No evidence of dissection, stenosis (50% or greater), or occlusion. Skeleton: No evidence of acute abnormality on limited assessment. Other neck: No evidence of acute abnormality on limited assessment Upper chest: Visualized lung apices are clear. Review of the MIP images confirms the above findings CTA HEAD FINDINGS Anterior circulation: Bilateral intracranial ICAs, MCAs, and ACAs are patent without proximal hemodynamically significant stenosis. Posterior circulation: Bilateral intradural vertebral arteries, basilar  artery and bilateral posterior cerebral arteries are patent without proximal hemodynamically significant stenosis. Venous sinuses: As permitted by contrast timing, patent. Review of the MIP images confirms the above findings IMPRESSION: No large vessel occlusion or proximal hemodynamically significant stenosis. Electronically Signed   By: Feliberto Harts M.D.   On: 10/31/2023 23:40   MR BRAIN WO CONTRAST Result Date: 10/31/2023 CLINICAL DATA:  Transient ischemic attack (TIA) EXAM: MRI HEAD WITHOUT CONTRAST TECHNIQUE: Multiplanar, multiecho pulse sequences of the brain and surrounding structures were obtained without intravenous contrast. COMPARISON:  CT head June 30, 2021. FINDINGS: Brain: Approximally 2.6 x 1.9 cm extra-axial dural-based mass along the left frontoparietal convexity, compatible with a meningioma that likely is increased in size in comparison to 2022 although direct comparison is limited. Mild mass effect on the adjacent brain without brain edema. No midline shift. No evidence of acute infarct, acute hemorrhage, hydrocephalus  or extra-axial fluid collection. Moderate scattered T2/FLAIR hyperintensities in the white matter nonspecific but compatible with chronic microvascular ischemic change. Vascular: Major arterial flow voids are maintained at the skull base. Skull and upper cervical spine: Normal marrow signal. Sinuses/Orbits: Clear sinuses.  No acute orbital findings. Other: No mastoid effusions. IMPRESSION: 1. No evidence of acute intracranial abnormality. 2. Approximally 2.6 cm putative left frontoparietal convexity meningioma that likely is increased in size in comparison to 2022 CT head although direct comparison is limited. Mild mass effect on the adjacent brain without brain edema. Electronically Signed   By: Feliberto Harts M.D.   On: 10/31/2023 22:39    Procedures Procedures    Medications Ordered in ED Medications  lactated ringers bolus 1,000 mL (0 mLs Intravenous Stopped 10/31/23 2218)  potassium chloride SA (KLOR-CON M) CR tablet 40 mEq (40 mEq Oral Given 10/31/23 1722)  meclizine (ANTIVERT) tablet 12.5 mg (12.5 mg Oral Given 10/31/23 1722)  iohexol (OMNIPAQUE) 350 MG/ML injection 75 mL (75 mLs Intravenous Contrast Given 10/31/23 1945)    ED Course/ Medical Decision Making/ A&P                                 Medical Decision Making Amount and/or Complexity of Data Reviewed Labs: ordered. Radiology: ordered.  Risk Prescription drug management.   This patient presents to the ED for concern of dizziness, this involves an extensive number of treatment options, and is a complaint that carries with it a high risk of complications and morbidity.  The differential diagnosis includes dehydration, polypharmacy, anemia, vestibular neuritis, Mnire's disease, BPPV, CVA, TIA, arrhythmia   Co morbidities that complicate the patient evaluation  HTN, bradycardia, HLD   Additional history obtained:  Additional history obtained from patient's wife External records from outside source obtained and reviewed  including EMR   Lab Tests:  I Ordered, and personally interpreted labs.  The pertinent results include: Leukocytosis is present.  Hypokalemia is present.  Lab work is otherwise unremarkable.   Imaging Studies ordered:  I ordered imaging studies including CTA head and neck, MRI brain I independently visualized and interpreted imaging which showed no acute findings.  Redemonstration of known meningioma. I agree with the radiologist interpretation   Cardiac Monitoring: / EKG:  The patient was maintained on a cardiac monitor.  I personally viewed and interpreted the cardiac monitored which showed an underlying rhythm of: Sinus rhythm   Problem List / ED Course / Critical interventions / Medication management  Patient presenting for lightheadedness, dizziness, fatigue.  Onset was  this morning.  Symptoms have improved since that time.  On exam, he is well-appearing.  He has no focal neurologic deficits.  Blood pressure is currently normal.  When stood up, he does have some recurrence of mild dizziness.  SBP drops approximately 20 points.  IV fluids were ordered.  Meclizine was ordered for symptomatic relief.  Workup was initiated.  Lab work was notable for hypokalemia.  Replacement potassium was given in the ED.  On reassessment, patient reports resolution of symptoms.  He remained in normal sinus rhythm while in the ED.  MRI brain and CTA of head and neck did not show acute findings.  Patient was discharged in good condition. I ordered medication including IV fluids for hydration; meclizine for dizziness; potassium chloride for hypokalemia Reevaluation of the patient after these medicines showed that the patient improved I have reviewed the patients home medicines and have made adjustments as needed   Social Determinants of Health:  Lives at home with wife        Final Clinical Impression(s) / ED Diagnoses Final diagnoses:  Dizziness    Rx / DC Orders ED Discharge Orders           Ordered    meclizine (ANTIVERT) 12.5 MG tablet  3 times daily PRN        10/31/23 2348              Gloris Manchester, MD 10/31/23 2349

## 2023-11-03 ENCOUNTER — Ambulatory Visit: Payer: PRIVATE HEALTH INSURANCE | Admitting: Internal Medicine

## 2023-11-03 ENCOUNTER — Other Ambulatory Visit (HOSPITAL_COMMUNITY): Payer: Self-pay

## 2023-11-04 ENCOUNTER — Other Ambulatory Visit: Payer: Self-pay | Admitting: Internal Medicine

## 2023-11-04 DIAGNOSIS — E878 Other disorders of electrolyte and fluid balance, not elsewhere classified: Secondary | ICD-10-CM | POA: Diagnosis not present

## 2023-11-11 ENCOUNTER — Ambulatory Visit
Admission: RE | Admit: 2023-11-11 | Discharge: 2023-11-11 | Disposition: A | Discharge status: Home / Self Care | Payer: PRIVATE HEALTH INSURANCE | Source: Ambulatory Visit | Attending: Internal Medicine | Admitting: Internal Medicine

## 2023-11-11 DIAGNOSIS — K862 Cyst of pancreas: Secondary | ICD-10-CM | POA: Diagnosis not present

## 2023-11-23 DIAGNOSIS — D329 Benign neoplasm of meninges, unspecified: Secondary | ICD-10-CM | POA: Diagnosis not present

## 2023-11-24 ENCOUNTER — Other Ambulatory Visit (HOSPITAL_BASED_OUTPATIENT_CLINIC_OR_DEPARTMENT_OTHER): Payer: Self-pay | Admitting: Neurological Surgery

## 2023-11-24 DIAGNOSIS — D329 Benign neoplasm of meninges, unspecified: Secondary | ICD-10-CM

## 2023-11-25 DIAGNOSIS — H18553 Macular corneal dystrophy, bilateral: Secondary | ICD-10-CM | POA: Diagnosis not present

## 2023-11-25 DIAGNOSIS — H35033 Hypertensive retinopathy, bilateral: Secondary | ICD-10-CM | POA: Diagnosis not present

## 2023-11-25 DIAGNOSIS — H04123 Dry eye syndrome of bilateral lacrimal glands: Secondary | ICD-10-CM | POA: Diagnosis not present

## 2023-11-25 DIAGNOSIS — H26493 Other secondary cataract, bilateral: Secondary | ICD-10-CM | POA: Diagnosis not present

## 2023-12-01 DIAGNOSIS — Z125 Encounter for screening for malignant neoplasm of prostate: Secondary | ICD-10-CM | POA: Diagnosis not present

## 2023-12-01 DIAGNOSIS — I10 Essential (primary) hypertension: Secondary | ICD-10-CM | POA: Diagnosis not present

## 2023-12-01 DIAGNOSIS — R748 Abnormal levels of other serum enzymes: Secondary | ICD-10-CM | POA: Diagnosis not present

## 2023-12-09 ENCOUNTER — Ambulatory Visit (HOSPITAL_BASED_OUTPATIENT_CLINIC_OR_DEPARTMENT_OTHER)

## 2023-12-09 DIAGNOSIS — N1831 Chronic kidney disease, stage 3a: Secondary | ICD-10-CM | POA: Diagnosis not present

## 2023-12-09 DIAGNOSIS — Z0000 Encounter for general adult medical examination without abnormal findings: Secondary | ICD-10-CM | POA: Diagnosis not present

## 2023-12-09 DIAGNOSIS — D472 Monoclonal gammopathy: Secondary | ICD-10-CM | POA: Diagnosis not present

## 2023-12-09 DIAGNOSIS — N529 Male erectile dysfunction, unspecified: Secondary | ICD-10-CM | POA: Diagnosis not present

## 2023-12-09 DIAGNOSIS — E269 Hyperaldosteronism, unspecified: Secondary | ICD-10-CM | POA: Diagnosis not present

## 2023-12-09 DIAGNOSIS — I10 Essential (primary) hypertension: Secondary | ICD-10-CM | POA: Diagnosis not present

## 2023-12-09 DIAGNOSIS — I2510 Atherosclerotic heart disease of native coronary artery without angina pectoris: Secondary | ICD-10-CM | POA: Diagnosis not present

## 2023-12-09 DIAGNOSIS — N401 Enlarged prostate with lower urinary tract symptoms: Secondary | ICD-10-CM | POA: Diagnosis not present

## 2023-12-09 DIAGNOSIS — J301 Allergic rhinitis due to pollen: Secondary | ICD-10-CM | POA: Diagnosis not present

## 2023-12-09 DIAGNOSIS — I700 Atherosclerosis of aorta: Secondary | ICD-10-CM | POA: Diagnosis not present

## 2023-12-09 DIAGNOSIS — R911 Solitary pulmonary nodule: Secondary | ICD-10-CM | POA: Diagnosis not present

## 2023-12-09 DIAGNOSIS — D6862 Lupus anticoagulant syndrome: Secondary | ICD-10-CM | POA: Diagnosis not present

## 2024-01-11 ENCOUNTER — Other Ambulatory Visit: Payer: Self-pay | Admitting: Internal Medicine

## 2024-01-13 ENCOUNTER — Ambulatory Visit: Attending: Internal Medicine | Admitting: Internal Medicine

## 2024-01-13 VITALS — BP 130/90 | HR 62 | Ht 69.0 in | Wt 164.8 lb

## 2024-01-13 DIAGNOSIS — R931 Abnormal findings on diagnostic imaging of heart and coronary circulation: Secondary | ICD-10-CM | POA: Diagnosis not present

## 2024-01-13 DIAGNOSIS — E269 Hyperaldosteronism, unspecified: Secondary | ICD-10-CM

## 2024-01-13 DIAGNOSIS — I10 Essential (primary) hypertension: Secondary | ICD-10-CM

## 2024-01-13 DIAGNOSIS — E785 Hyperlipidemia, unspecified: Secondary | ICD-10-CM | POA: Diagnosis not present

## 2024-01-13 NOTE — Progress Notes (Signed)
 OFFICE CONSULT NOTE  Chief Complaint:  Follow-up dyslipidemia  Primary Care Physician: Imelda Man, MD  HPI:  Shane Lam is a 73 y.o. male who is being seen today for the evaluation of hypertension and dyslipidemia at the request of Imelda Man, MD. this is a pleasant 73 year old male kindly referred for evaluation management of hypertension and dyslipidemia.  Recently he has been having some difficulty with hypertension management.  He carries a diagnosis of Conn syndrome.  He was previously managed by endocrinologist and Dr. Rebeca Camps practice and has been on 50 mg spironolactone  but apparently this caused issues with his renal function.  It also been having issues with hypokalemia related to hyperaldosteronism, requiring potassium supplementation.  He was on a mixed diuretic with triamterene/HCTZ however that did not seem to help his hypokalemia enough.  Blood pressure diary was reviewed by myself and indicates blood pressures generally between 125-145 systolic.  Another issue reportedly was with bradycardia, precluding any beta-blocker or nondihydropyridine calcium  channel blocker.  Shane Lam has had a lot of recent imaging including ultrasound which indicated fatty liver however his liver enzymes recently have been normal.  Calcium  score was performed in March 2022 which showed a total of 114, 65th percentile for age and sex matched controls.  There was also a 4 mm nodule along the right minor fissure which will need follow-up.  The aorta measured 3.9 cm in the a sending portion, this should be followed up as well in a year.  12/14/2021  Shane Lam returns today for follow-up.  Blood pressure appears to be somewhat elevated.  Initially 142/96 but repeat was 138/96.  According to his wife he was apparently taken off of 10 mg of felodipine  during his last hospitalization and is currently only on 5 mg.  Otherwise as mentioned above he was noted to have multivessel coronary calcium  and  despite aggressive diet and exercise and being normal weight, his cholesterol remains elevated.  LDL in November was 121.  Recent repeat lipids show total cholesterol 170, HDL 46, triglycerides 47 and LDL of 114.  His goal LDL is less than 70.  04/14/2022  Shane Lam is seen today for follow-up of dyslipidemia.  He has had a nice response to statin therapy on low-dose rosuvastatin .  Total cholesterol now 124, triglycerides 51, HDL 48 and LDL 64.  He denies any side effects with the medication.  Blood pressure is also well controlled today at 120/82.  He was noted to have a recently mildly elevated creatinine at 1.3.  He has not had follow-up labs although this may be related to spironolactone .  He was diagnosed with hyperaldosteronism.  Initially he was on 50 mg of Aldactone  but was cut back to 25 mg.  He apparently has follow-up with his PCP next month.  10/21/2022  Shane Lam is seen today in follow-up.  Overall he is feeling quite well.  He had recent repeat lipids performed which showed total cholesterol 113, HDL 49, triglycerides 46 and LDL 53.  Blood pressure at home is well-controlled between 115-125 systolic over 70-80 diastolic.  EKG was performed today showing sinus bradycardia with some PACs.  He reports some occasional palpitations but is not bothered by them.  01/13/2024  Shane Lam is seen today in follow-up.  He recently was having some issues with dizziness.  He underwent CT scanning and was found to have a meningioma however felt to be benign.  He has been given some meclizine  for that.  He otherwise seems to  be doing pretty well.  Blood pressure today was 130/90 however he says runs normally excellent at home.  He had lipids tested in April which showed total cholesterol 108, HDL 41, triglycerides 30 and LDL 58 on low-dose rosuvastatin .  PMHx:  Past Medical History:  Diagnosis Date   Allergic rhinitis due to pollen    Essential hypertension, malignant    Nonspecific abnormal  electrocardiogram (ECG) (EKG)    Pure hypercholesterolemia     FAMHx:  Family History  Problem Relation Age of Onset   CAD Mother    Brain cancer Mother    CAD Father    Pancreatic cancer Father    Lung cancer Maternal Aunt     SOCHx:   reports that he has never smoked. He has never used smokeless tobacco. He reports that he does not drink alcohol and does not use drugs.  ALLERGIES:  Allergies  Allergen Reactions   Phenergan  [Promethazine ] Other (See Comments)    Makes Pt delirious    ROS: Pertinent items noted in HPI and remainder of comprehensive ROS otherwise negative.  HOME MEDS: Current Outpatient Medications on File Prior to Visit  Medication Sig Dispense Refill   Cholecalciferol (VITAMIN D3) 250 MCG (10000 UT) capsule Take 10,000 Units by mouth once a week.     eplerenone (INSPRA) 25 MG tablet Take 25 mg by mouth daily.     felodipine  (PLENDIL ) 10 MG 24 hr tablet Take 1 tablet (10 mg total) by mouth daily. Please keep scheduled appointment for future refills. Thank you. 30 tablet 0   fluticasone  (FLONASE ) 50 MCG/ACT nasal spray Place 2 sprays into both nostrils daily. 18.2 mL 0   meclizine  (ANTIVERT ) 12.5 MG tablet Take 1 tablet (12.5 mg total) by mouth 3 (three) times daily as needed for dizziness. 12 tablet 0   Multiple Vitamin (MULTIVITAMIN) tablet Take 1 tablet by mouth daily.       potassium chloride  SA (KLOR-CON  M) 20 MEQ tablet Take 20 mEq by mouth daily.     rosuvastatin  (CRESTOR ) 5 MG tablet TAKE ONE TABLET BY MOUTH ONE TIME DAILY 90 tablet 1   tadalafil (CIALIS) 10 MG tablet 1 tablet     telmisartan (MICARDIS) 80 MG tablet Take 80 mg by mouth daily.     No current facility-administered medications on file prior to visit.    LABS/IMAGING: No results found for this or any previous visit (from the past 48 hours). No results found.  LIPID PANEL:    Component Value Date/Time   CHOL 113 10/11/2022 1046   TRIG 46 10/11/2022 1046   HDL 49 10/11/2022 1046    CHOLHDL 2.3 10/11/2022 1046   CHOLHDL 5.6 07/05/2021 0718   VLDL 15 07/05/2021 0718   LDLCALC 53 10/11/2022 1046    WEIGHTS: Wt Readings from Last 3 Encounters:  01/13/24 164 lb 12.8 oz (74.8 kg)  10/21/22 163 lb 3.2 oz (74 kg)  04/14/22 152 lb (68.9 kg)    VITALS: BP (!) 130/90 (BP Location: Right Arm, Patient Position: Sitting, Cuff Size: Normal)   Pulse 62   Ht 5\' 9"  (1.753 m)   Wt 164 lb 12.8 oz (74.8 kg)   SpO2 97%   BMI 24.34 kg/m   EXAM: General appearance: alert and no distress Lungs: clear to auscultation bilaterally Heart: regular rate and rhythm, S1, S2 normal, no murmur, click, rub or gallop Extremities: extremities normal, atraumatic, no cyanosis or edema Neurologic: Grossly normal  EKG: Deferred  ASSESSMENT: Mixed dyslipidemia, goal LDL <70  Conn syndrome/hyperaldosteronism Dilated ascending aorta to 39 mm (repeat CT studies do not indicated this) Elevated CAC score of 114, 65th percentile for age and sex matched control Solitary pulmonary nodule -considered benign by serial CT  PLAN: 1.   Shane Lam is doing well with good control of his blood pressure on combination of eplerenone and telmisartan as well as felodipine .  Cholesterol is also at target on low-dose rosuvastatin .  He had a benign pulmonary nodule which will not require follow-up.  He had a recent CT which did not show any evidence of a dilated aorta.  He was recently diagnosed with a meningioma but will need follow-up on that.  Otherwise he seems to be doing well.  Plan follow-up annually or sooner as necessary.  Shane Lites, MD, [facility], FNLA, FACP  [place]  [facility]  Medical Director of the Advanced Lipid Disorders &  Cardiovascular Risk Reduction Clinic Diplomate of the American Board of Clinical Lipidology Attending Cardiologist  Direct Dial: [phone]  Fax: [phone]  Website:  [email or web address]   Aviva Lemmings Peyten Weare 01/13/2024, 10:00 AM

## 2024-01-13 NOTE — Patient Instructions (Signed)
 Medication Instructions:  Your physician recommends that you continue on your current medications as directed. Please refer to the Current Medication list given to you today.  *If you need a refill on your cardiac medications before your next appointment, please call your pharmacy*  Follow-Up: At [facility], you and your health needs are our priority.  As part of our continuing mission to provide you with exceptional heart care, our providers are all part of one team.  This team includes your primary Cardiologist (physician) and Advanced Practice Providers or APPs (Physician Assistants and Nurse Practitioners) who all work together to provide you with the care you need, when you need it.  Your next appointment:   In 1 year with Dr. Maximo Spar or APP

## 2024-01-27 DIAGNOSIS — E269 Hyperaldosteronism, unspecified: Secondary | ICD-10-CM | POA: Diagnosis not present

## 2024-01-27 DIAGNOSIS — I10 Essential (primary) hypertension: Secondary | ICD-10-CM | POA: Diagnosis not present

## 2024-02-03 ENCOUNTER — Other Ambulatory Visit: Payer: Self-pay | Admitting: Internal Medicine

## 2024-03-06 ENCOUNTER — Other Ambulatory Visit: Payer: Self-pay | Admitting: Internal Medicine

## 2024-05-07 ENCOUNTER — Ambulatory Visit (INDEPENDENT_AMBULATORY_CARE_PROVIDER_SITE_OTHER)

## 2024-05-07 ENCOUNTER — Ambulatory Visit (INDEPENDENT_AMBULATORY_CARE_PROVIDER_SITE_OTHER): Admitting: Podiatry

## 2024-05-07 DIAGNOSIS — M7752 Other enthesopathy of left foot: Secondary | ICD-10-CM | POA: Diagnosis not present

## 2024-05-07 DIAGNOSIS — M7751 Other enthesopathy of right foot: Secondary | ICD-10-CM

## 2024-05-07 DIAGNOSIS — M722 Plantar fascial fibromatosis: Secondary | ICD-10-CM | POA: Diagnosis not present

## 2024-05-07 MED ORDER — BETAMETHASONE SOD PHOS & ACET 6 (3-3) MG/ML IJ SUSP
3.0000 mg | Freq: Once | INTRAMUSCULAR | Status: AC
  Administered 2024-05-07: 3 mg via INTRA_ARTICULAR

## 2024-05-07 MED ORDER — METHYLPREDNISOLONE 4 MG PO TBPK: ORAL_TABLET | ORAL | 0 refills | Status: AC

## 2024-05-07 MED ORDER — MELOXICAM 15 MG PO TABS: 15.0000 mg | ORAL_TABLET | Freq: Every day | ORAL | 1 refills | Status: DC

## 2024-05-07 NOTE — Progress Notes (Signed)
   Chief Complaint  Patient presents with   Foot Pain    Pt is here due to left foot pain, no injury to the foot, started a month ago, the pain is mostly in the heel of the foot,, pain is on and off.    Subjective: 73 y.o. male    Past Medical History:  Diagnosis Date   Allergic rhinitis due to pollen    Essential hypertension, malignant    Nonspecific abnormal electrocardiogram (ECG) (EKG)    Pure hypercholesterolemia      Objective: Physical Exam General: The patient is alert and oriented x3 in no acute distress.  Dermatology: Skin is warm, dry and supple bilateral lower extremities. Negative for open lesions or macerations bilateral.   Vascular: Dorsalis Pedis and Posterior Tibial pulses palpable bilateral.  Capillary fill time is immediate to all digits.  Neurological: Epicritic and protective threshold intact bilateral.   Musculoskeletal: Tenderness to palpation to the plantar aspect of the left heel along the plantar fascia. All other joints range of motion within normal limits bilateral. Strength 5/5 in all groups bilateral.   Radiographic exam: Normal osseous mineralization. Joint spaces preserved. No fracture/dislocation/boney destruction. No other soft tissue abnormalities or radiopaque foreign bodies.   Assessment: 1. Plantar fasciitis left foot  Plan of Care:  -Patient evaluated. Xrays reviewed.   -Injection of 0.5cc Celestone  soluspan injected into the left plantar fascia.  -Rx for Medrol  Dose Pak  -Rx for Meloxicam  15 mg daily after completion of the Dosepak -Continue wearing good supportive shoes and slides around the house.  Refrain from going barefoot -Return to clinic in 3 weeks   Thresa EMERSON Sar, DPM [facility]  Dr. Thresa EMERSON Sar, DPM    [address] [place], [place] [address]                Office [phone]  Fax [phone]

## 2024-05-07 NOTE — Patient Instructions (Signed)
-  year-old

## 2024-05-25 ENCOUNTER — Ambulatory Visit (HOSPITAL_BASED_OUTPATIENT_CLINIC_OR_DEPARTMENT_OTHER)
Admission: RE | Admit: 2024-05-25 | Discharge: 2024-05-25 | Disposition: A | Discharge status: Home / Self Care | Source: Ambulatory Visit | Attending: Neurological Surgery | Admitting: Neurological Surgery

## 2024-05-25 ENCOUNTER — Other Ambulatory Visit: Payer: Self-pay | Admitting: Internal Medicine

## 2024-05-25 DIAGNOSIS — D329 Benign neoplasm of meninges, unspecified: Secondary | ICD-10-CM | POA: Diagnosis not present

## 2024-05-25 MED ORDER — GADOBUTROL 1 MMOL/ML IV SOLN
7.5000 mL | Freq: Once | INTRAVENOUS | Status: AC | PRN
Start: 2024-05-25 — End: 2024-05-25
  Administered 2024-05-25: 7.5 mL via INTRAVENOUS
  Filled 2024-05-25: qty 7.5

## 2024-05-30 ENCOUNTER — Ambulatory Visit: Admitting: Podiatry

## 2024-05-30 VITALS — Ht 69.0 in | Wt 164.8 lb

## 2024-05-30 DIAGNOSIS — M722 Plantar fascial fibromatosis: Secondary | ICD-10-CM

## 2024-05-30 NOTE — Progress Notes (Signed)
   Chief Complaint  Patient presents with   Plantar Fasciitis    Pt is here to f/u on left foot due to plantar fasciitis, he states that his foot feels great and is about 95% better.     Subjective: 73 y.o. male presenting for follow-up evaluation of plantar fasciitis to the left heel.  Patient states he is significantly better.   Past Medical History:  Diagnosis Date   Allergic rhinitis due to pollen    Essential hypertension, malignant    Nonspecific abnormal electrocardiogram (ECG) (EKG)    Pure hypercholesterolemia      Objective: Physical Exam General: The patient is alert and oriented x3 in no acute distress.  Dermatology: Skin is warm, dry and supple bilateral lower extremities. Negative for open lesions or macerations bilateral.   Vascular: Dorsalis Pedis and Posterior Tibial pulses palpable bilateral.  Capillary fill time is immediate to all digits.  Neurological: Grossly intact via light touch  Musculoskeletal: Negative for any appreciable tenderness to palpation to the plantar aspect of the left heel along the plantar fascia. All other joints range of motion within normal limits bilateral. Strength 5/5 in all groups bilateral.    Assessment: 1. Plantar fasciitis left foot; resolved  Plan of Care:  -Patient evaluated. -Continue wearing good supportive tennis shoes and sneakers -Continue meloxicam  15 mg daily PRN -Return to clinic PRN  Thresa EMERSON Sar, DPM [facility]  Dr. Thresa EMERSON Sar, DPM    [address] [place], [place] [address]                Office [phone]  Fax [phone]

## 2024-07-05 ENCOUNTER — Other Ambulatory Visit: Payer: Self-pay | Admitting: Podiatry

## 2024-08-01 DIAGNOSIS — E269 Hyperaldosteronism, unspecified: Secondary | ICD-10-CM | POA: Diagnosis not present

## 2024-08-01 DIAGNOSIS — I10 Essential (primary) hypertension: Secondary | ICD-10-CM | POA: Diagnosis not present

## 2024-08-08 DIAGNOSIS — I10 Essential (primary) hypertension: Secondary | ICD-10-CM | POA: Diagnosis not present

## 2024-08-08 DIAGNOSIS — E269 Hyperaldosteronism, unspecified: Secondary | ICD-10-CM | POA: Diagnosis not present

## 2024-08-20 ENCOUNTER — Other Ambulatory Visit: Payer: Self-pay

## 2024-08-20 ENCOUNTER — Encounter (HOSPITAL_COMMUNITY): Payer: Self-pay

## 2024-08-20 ENCOUNTER — Emergency Department (HOSPITAL_COMMUNITY)
Admission: EM | Admit: 2024-08-20 | Discharge: 2024-08-21 | Disposition: A | Discharge status: Home / Self Care | Attending: Emergency Medicine | Admitting: Emergency Medicine

## 2024-08-20 DIAGNOSIS — I10 Essential (primary) hypertension: Secondary | ICD-10-CM | POA: Diagnosis not present

## 2024-08-20 DIAGNOSIS — R509 Fever, unspecified: Secondary | ICD-10-CM | POA: Diagnosis present

## 2024-08-20 DIAGNOSIS — Z79899 Other long term (current) drug therapy: Secondary | ICD-10-CM | POA: Diagnosis not present

## 2024-08-20 DIAGNOSIS — J101 Influenza due to other identified influenza virus with other respiratory manifestations: Secondary | ICD-10-CM | POA: Diagnosis not present

## 2024-08-20 NOTE — ED Triage Notes (Signed)
 Pt c/o fever today, highest at home was 102. Last tylenol  at 2215

## 2024-08-21 LAB — RESP PANEL BY RT-PCR (RSV, FLU A&B, COVID)  RVPGX2
Influenza A by PCR: POSITIVE — AB
Influenza B by PCR: NEGATIVE
Resp Syncytial Virus by PCR: NEGATIVE
SARS Coronavirus 2 by RT PCR: NEGATIVE

## 2024-08-21 MED ORDER — ONDANSETRON 4 MG PO TBDP: 4.0000 mg | ORAL_TABLET | Freq: Three times a day (TID) | ORAL | 0 refills | Status: AC | PRN

## 2024-08-21 MED ORDER — ACETAMINOPHEN 325 MG PO TABS
650.0000 mg | ORAL_TABLET | Freq: Once | ORAL | Status: AC
  Administered 2024-08-21: 650 mg via ORAL
  Filled 2024-08-21: qty 2

## 2024-08-21 NOTE — ED Provider Notes (Signed)
 " [facility] Provider Note   CSN: [id number] Arrival date & time: 08/20/24  2312     Patient presents with: Fever   Shane Lam is a 73 y.o. male.   The history is provided by the patient, the spouse and medical records.  Fever Shane Lam is a 73 y.o. male who presents to the Emergency Department complaining of fever.  He presents to the emergency department accompanied by his wife for evaluation of fever to 102.8 that started yesterday.  He started getting sick on Sunday with nasal congestion, cough.  Significant fever starting yesterday.  No vomiting but he has had nausea.  No chest pain or difficulty breathing.  Wife states that when he had a fever he was confused.  He was drinking less than he should.  She gave him acetaminophen  and his fever improved and he was able to drink better after that.  He has a history of hypertension, Conn syndrome.  His wife tested positive for influenza last week.     Prior to Admission medications  Medication Sig Start Date End Date Taking? Authorizing Provider  ondansetron  (ZOFRAN -ODT) 4 MG disintegrating tablet Take 1 tablet (4 mg total) by mouth every 8 (eight) hours as needed. 08/21/24  Yes Griselda Norris, MD  Cholecalciferol (VITAMIN D3) 250 MCG (10000 UT) capsule Take 10,000 Units by mouth once a week.    [provider]  eplerenone (INSPRA) 25 MG tablet Take 25 mg by mouth daily. 04/08/23   [provider]  felodipine  (PLENDIL ) 10 MG 24 hr tablet Take 1 tablet (10 mg total) by mouth daily. 02/03/24   Mona Vinie BROCKS, MD  fluticasone  (FLONASE ) 50 MCG/ACT nasal spray Place 2 sprays into both nostrils daily. 05/15/22   Theotis Peers M, PA-C  meclizine  (ANTIVERT ) 12.5 MG tablet Take 1 tablet (12.5 mg total) by mouth 3 (three) times daily as needed for dizziness. 10/31/23   Melvenia Motto, MD  meloxicam  (MOBIC ) 15 MG tablet TAKE 1 TABLET (15 MG TOTAL) BY MOUTH DAILY. 07/05/24   Janit Thresa HERO, DPM  methylPREDNISolone  (MEDROL  DOSEPAK) 4 MG TBPK tablet 6 day dose pack - take as directed 05/07/24   Evans, Brent M, DPM  Multiple Vitamin (MULTIVITAMIN) tablet Take 1 tablet by mouth daily.      [provider]  potassium chloride  SA (KLOR-CON  M) 20 MEQ tablet Take 20 mEq by mouth daily.    [provider]  rosuvastatin  (CRESTOR ) 5 MG tablet TAKE ONE TABLET BY MOUTH ONE TIME DAILY 05/25/24   Hilty, Vinie BROCKS, MD  tadalafil (CIALIS) 10 MG tablet 1 tablet    [provider]  telmisartan (MICARDIS) 80 MG tablet Take 80 mg by mouth daily. 10/27/20   [provider]    Allergies: Phenergan  [promethazine ]    Review of Systems  Constitutional:  Positive for fever.  All other systems reviewed and are negative.   Updated Vital Signs BP (!) 144/106   Pulse 64   Temp 98 F (36.7 C)   Resp 16   Ht 5' 9 (1.753 m)   Wt 76.7 kg   SpO2 100%   BMI 24.96 kg/m   Physical Exam Vitals and nursing note reviewed.  Constitutional:      Appearance: He is well-developed.  HENT:     Head: Normocephalic and atraumatic.  Cardiovascular:     Rate and Rhythm: Normal rate and regular rhythm.     Heart sounds: No murmur heard. Pulmonary:  Effort: Pulmonary effort is normal. No respiratory distress.     Breath sounds: Normal breath sounds.  Abdominal:     Palpations: Abdomen is soft.     Tenderness: There is no abdominal tenderness. There is no guarding or rebound.  Musculoskeletal:        General: No tenderness.  Skin:    General: Skin is warm and dry.  Neurological:     Mental Status: He is alert and oriented to person, place, and time.     Comments: Moves all extremities symmetrically  Psychiatric:        Behavior: Behavior normal.     (all labs ordered are listed, but only abnormal results are displayed) Labs Reviewed  RESP PANEL BY RT-PCR (RSV, FLU A&B, COVID)  RVPGX2 - Abnormal; Notable for the following components:      Result Value    Influenza A by PCR POSITIVE (*)    All other components within normal limits    EKG: None  Radiology: No results found.   Procedures   Medications Ordered in the ED  acetaminophen  (TYLENOL ) tablet 650 mg (650 mg Oral Given 08/21/24 0526)                                    Medical Decision Making Risk OTC drugs. Prescription drug management.   Patient with history of hypertension, Conn syndrome here for evaluation of fever to 102.8 at home yesterday.  This did improve after acetaminophen  administration by his wife.  At time of ED evaluation he is relatively asymptomatic.  Lungs are clear on examination with no respiratory distress.  He is positive for influenza.  No clinical evidence of pneumonia.  He is out of the antiviral window given duration of symptoms.  Discussed with patient, wife at bedside treatment for influenza with symptomatic care, oral fluid hydration, fever control.  Discussed outpatient follow-up as well as return precautions.     Final diagnoses:  Influenza A    ED Discharge Orders          Ordered    ondansetron  (ZOFRAN -ODT) 4 MG disintegrating tablet  Every 8 hours PRN        08/21/24 0523               Griselda Norris, MD 08/21/24 [phone]  "
